# Patient Record
Sex: Female | Born: 1940 | Race: White | Hispanic: No | Smoking: Former smoker
Health system: Southern US, Community
[De-identification: ages and names within clinical notes are randomized; demographics above are authoritative.]

## PROBLEM LIST (undated history)

## (undated) DIAGNOSIS — E785 Hyperlipidemia, unspecified: Secondary | ICD-10-CM

## (undated) DIAGNOSIS — K759 Inflammatory liver disease, unspecified: Secondary | ICD-10-CM

## (undated) DIAGNOSIS — Z8619 Personal history of other infectious and parasitic diseases: Secondary | ICD-10-CM

## (undated) DIAGNOSIS — E039 Hypothyroidism, unspecified: Secondary | ICD-10-CM

## (undated) DIAGNOSIS — Z905 Acquired absence of kidney: Secondary | ICD-10-CM

## (undated) DIAGNOSIS — L509 Urticaria, unspecified: Secondary | ICD-10-CM

## (undated) DIAGNOSIS — J302 Other seasonal allergic rhinitis: Secondary | ICD-10-CM

## (undated) DIAGNOSIS — T783XXA Angioneurotic edema, initial encounter: Secondary | ICD-10-CM

## (undated) HISTORY — PX: ABDOMINAL HYSTERECTOMY: SHX81

## (undated) HISTORY — PX: APPENDECTOMY: SHX54

## (undated) HISTORY — DX: Hyperlipidemia, unspecified: E78.5

## (undated) HISTORY — DX: Personal history of other infectious and parasitic diseases: Z86.19

## (undated) HISTORY — DX: Inflammatory liver disease, unspecified: K75.9

## (undated) HISTORY — DX: Hypothyroidism, unspecified: E03.9

## (undated) HISTORY — DX: Angioneurotic edema, initial encounter: T78.3XXA

## (undated) HISTORY — DX: Other seasonal allergic rhinitis: J30.2

## (undated) HISTORY — PX: NEPHRECTOMY: SHX65

## (undated) HISTORY — PX: COLON RESECTION: SHX5231

## (undated) HISTORY — DX: Urticaria, unspecified: L50.9

## (undated) HISTORY — DX: Acquired absence of kidney: Z90.5

---

## 2001-07-09 ENCOUNTER — Emergency Department (HOSPITAL_COMMUNITY): Admission: EM | Admit: 2001-07-09 | Discharge: 2001-07-09 | Payer: Self-pay | Admitting: Emergency Medicine

## 2002-04-13 ENCOUNTER — Encounter: Payer: Self-pay | Admitting: Internal Medicine

## 2002-04-13 ENCOUNTER — Encounter: Admission: RE | Admit: 2002-04-13 | Discharge: 2002-04-13 | Payer: Self-pay | Admitting: Internal Medicine

## 2002-05-24 ENCOUNTER — Encounter: Payer: Self-pay | Admitting: Emergency Medicine

## 2002-05-24 ENCOUNTER — Emergency Department (HOSPITAL_COMMUNITY): Admission: EM | Admit: 2002-05-24 | Discharge: 2002-05-24 | Payer: Self-pay | Admitting: *Deleted

## 2004-01-04 ENCOUNTER — Emergency Department (HOSPITAL_COMMUNITY): Admission: AD | Admit: 2004-01-04 | Discharge: 2004-01-04 | Payer: Self-pay | Admitting: Internal Medicine

## 2004-10-25 HISTORY — PX: COLONOSCOPY: SHX174

## 2005-05-05 ENCOUNTER — Ambulatory Visit (HOSPITAL_COMMUNITY): Admission: RE | Admit: 2005-05-05 | Discharge: 2005-05-05 | Payer: Self-pay | Admitting: *Deleted

## 2005-05-05 ENCOUNTER — Encounter (INDEPENDENT_AMBULATORY_CARE_PROVIDER_SITE_OTHER): Payer: Self-pay | Admitting: *Deleted

## 2009-11-04 ENCOUNTER — Encounter: Payer: Self-pay | Admitting: Internal Medicine

## 2010-09-22 ENCOUNTER — Encounter (INDEPENDENT_AMBULATORY_CARE_PROVIDER_SITE_OTHER): Payer: Self-pay | Admitting: *Deleted

## 2010-10-07 ENCOUNTER — Ambulatory Visit (HOSPITAL_COMMUNITY): Admission: RE | Admit: 2010-10-07 | Payer: Self-pay | Source: Home / Self Care | Admitting: Orthopedic Surgery

## 2010-10-25 HISTORY — PX: CHOLECYSTECTOMY: SHX55

## 2010-11-10 ENCOUNTER — Ambulatory Visit
Admission: RE | Admit: 2010-11-10 | Discharge: 2010-11-10 | Payer: Self-pay | Source: Home / Self Care | Attending: Internal Medicine | Admitting: Internal Medicine

## 2010-11-15 ENCOUNTER — Encounter: Payer: Self-pay | Admitting: Orthopedic Surgery

## 2010-11-16 ENCOUNTER — Ambulatory Visit: Admit: 2010-11-16 | Payer: Self-pay | Admitting: Internal Medicine

## 2010-11-24 NOTE — Letter (Signed)
Summary: New Patient letter  [facility]  [address]   [place], [place] [address]   Phone: [phone]  Fax: [phone]       09/22/2010 MRN: [id number]  [facility] [address] [place], [place]  [address]  Dear Ms. Azzara,  Welcome to the Gastroenterology Division at [facility].    You are scheduled to see Dr.  Leone Payor on 11/10/2009 at 3:00 on the 3rd floor at [facility], [address]  We ask that you try to arrive at our office 15 minutes prior to your appointment time to allow for check-in.  We would like you to complete the enclosed self-administered evaluation form prior to your visit and bring it with you on the day of your appointment.  We will review it with you.  Also, please bring a complete list of all your medications or, if you prefer, bring the medication bottles and we will list them.  Please bring your insurance card so that we may make a copy of it.  If your insurance requires a referral to see a specialist, please bring your referral form from your primary care physician.  Co-payments are due at the time of your visit and may be paid by cash, check or credit card.     Your office visit will consist of a consult with your physician (includes a physical exam), any laboratory testing he/she may order, scheduling of any necessary diagnostic testing (e.g. x-ray, ultrasound, CT-scan), and scheduling of a procedure (e.g. Endoscopy, Colonoscopy) if required.  Please allow enough time on your schedule to allow for any/all of these possibilities.    If you cannot keep your appointment, please call [phone] to cancel or reschedule prior to your appointment date.  This allows [place] the opportunity to schedule an appointment for another patient in need of care.  If you do not cancel or reschedule by 5 p.m. the business day prior to your appointment date, you will be charged a $50.00 late cancellation/no-show fee.    Thank you for choosing  [facility] for your medical needs.  We appreciate the opportunity to care for you.  Please visit [place] at our website  to learn more about our practice.                     Sincerely,                                                             The Gastroenterology Division

## 2010-11-26 NOTE — Assessment & Plan Note (Addendum)
Summary: PT REQUEST CONSULT FOR COLON.Joan Townsend.   History of Present Illness Visit Type: Initial Visit Primary GI MD: Stan Head MD [facility] Primary Provider: n/a Requesting Provider: n/a Chief Complaint: Patient would like to discuss recent rectal bleeding. History of Present Illness:   70 yo ww with rectal bleeding problems. Last colonoscopy [address]) showed hemorrhoids. She describes prior colon resection for endometriosis. She relates she had increased rectal bleeding after the colonoscopy in 2006. She has had intermittent rectal bleeding over the years especially when thyroid was underactive and stools were dry. She does have a protrusion at the anus at times. Again, never had that til 2006 colonoscopy (prep).  She saw GYN for PAP smear and was told to discuss rectal bleeding with GI. That is Dr. Alain Marion in [place], [place] ([facility]). Only MD she sees.   GI Review of Systems      Denies abdominal pain, acid reflux, belching, bloating, chest pain, dysphagia with liquids, dysphagia with solids, heartburn, loss of appetite, nausea, vomiting, vomiting blood, weight loss, and  weight gain.      Reports constipation, hemorrhoids, and  rectal bleeding.     Denies anal fissure, black tarry stools, change in bowel habit, diarrhea, diverticulosis, fecal incontinence, heme positive stool, irritable bowel syndrome, jaundice, light color stool, liver problems, and  rectal pain. Preventive Screening-Counseling & Management  Alcohol-Tobacco     Smoking Status: quit  Caffeine-Diet-Exercise     Does Patient Exercise: no      Drug Use:  no.      Current Medications (verified): 1)  Estrace 1 Mg Tabs (Estradiol) .... Take 1 Tablet By Mouth Once A Day 2)  Levothroid 125 Mcg Tabs (Levothyroxine Sodium) .... Take 1 Tablet By Mouth Once Every Other Day 3)  Levothyroxine Sodium 88 Mcg Tabs (Levothyroxine Sodium) .... Take 1 Tablet By Mouth Every Other Day 4)  Multivitamins  Tabs (Multiple  Vitamin) .... Take 1 Tablet By Mouth Once A Day 5)  Vitamin D 4000 Iu Capsule .... Take 1 Capsule By Mouth Once A Day 6)  Potassium Iodine and Calcium Tablet .... Take 1 Tablet By Mouth Once A Day  Allergies (verified): 1)  ! Pcn 2)  ! 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 -stopped 1980 Alcohol Use - yes-occasional Illicit Drug Use - no Daily Caffeine Use-2 cups daily Patient does not get regular exercise.  Smoking Status:  quit Drug Use:  no Does Patient Exercise:  no  Review of Systems       The patient complains of fatigue and itching.         All other ROS negative except as per HPI.   Vital Signs:  Patient profile:   70 year old female Height:      67 inches Weight:      175.38 pounds BMI:     27.57 BSA:     1.91 Pulse rate:   104 / minute Pulse rhythm:   regular BP sitting:   142 / 74  (left arm)  Vitals Entered  By: Lamona Curl CMA Duncan Dull) (November 10, 2010 3:20 PM)  Physical Exam  General:  Well developed, well nourished, no acute distress. Eyes:  no icterus. Mouth:  No deformity or lesions, dentition normal. Neck:  Supple; no masses or thyromegaly. Lungs:  Clear throughout to auscultation. Heart:  Regular rate and rhythm; no murmurs, rubs,  or bruits. Abdomen:  scars BS+ soft and nontender without masses Rectal:  female staff present: two anal tags seen slight anal stenosis, brown stool small rectocele no  masses ANOSCOPY: inflamed hemorrhoids  Extremities:  No clubbing, cyanosis, edema or deformities noted. Cervical Nodes:  No significant cervical or supraclavicular adenopathy.  Psych:  Alert and cooperative. Normal mood and affect.   Impression & Recommendations:  Problem # 1:  HEMORRHOIDS, WITH BLEEDING (ICD-455.8) rectal bleeding bright red on toilet paper, chronic and unchanged aggravated by 2006 coonoscopy that showed them and no other problems discussed surgery referral for sclerosis or banding vs possible surgery I think these are grade II and operative intervention not likely she is aware that could be harboring  lesions even more proxmal like  cancer but with the history ad findings today and last colonoscopy 2006 ok, that is unlikely. she accepts not having a colonoscopy at this time. .Hgb > 12 at GYN visit (she will have labs sent)  Patient Instructions: 1)  Call our office when you are wanting to schedule your referral to [facility]. 2)  Hemorrhoids brochure given.  3)  The medication list was reviewed and reconciled.  All changed / newly prescribed medications were explained.  A complete medication list was provided to the patient / caregiver. Prescriptions: ANUSOL-HC 25 MG SUPP (HYDROCORTISONE ACETATE) 1 per rectum nightly x 7 nights then as needed  #21 x 0   Entered and Authorized by:   Iva Boop MD, [facility]   Signed by:   Iva Boop MD, FACG on 11/10/2010   Method used:   Electronically to        [facility]  [phone]* (retail)       [address]       [place], [place]  [id number]       Ph: [phone] or [phone]       Fax: [phone]   RxID:   [id number]   Appended Document: PT REQUEST CONSULT FOR COLON.Joan Townsend Labs from GYN showed thyroid studies only (normal) I recommend she have a CBC here due to rectal bleeding  Appended Document: PT REQUEST CONSULT FOR COLON.Joan Townsend. Left message for patient to call back   new lab  orders entered in [facility] for tomorrow  Appended Document: PT REQUEST CONSULT FOR COLON.Joan Townsend. Left message for patient to call back    Appended Document: PT REQUEST CONSULT FOR COLON.Joan Townsend. Left message for patient to call back    Appended Document: PT REQUEST CONSULT FOR COLON.Joan Townsend. I spoke with the patient and she says she is no longer having rectal bleeding. She states she will call me back about scehduling a CBC when her schedule permits.

## 2010-11-26 NOTE — Op Note (Signed)
Summary: Colonoscopy--Dr. [facility]   NAMEANELA, BENSMAN NO.:  [id number]   MEDICAL RECORD NO.:  [id number]          PATIENT TYPE:  AMB   LOCATION:  ENDO                         [facility]AC[facility]  [facility]   PHYSICIAN:  Georgiana Spinner, M.D.    DATE OF BIRTH:  11-22-1940   DATE OF PROCEDURE:  DATE OF DISCHARGE:                                 OPERATIVE REPORT   PROCEDURE:  Colonoscopy.   ENDOSCOPIST:  Georgiana Spinner, M.D.   INDICATIONS:  Colon cancer screening.   ANESTHESIA:  Demerol 70, Versed 7 mg.   INDICATION:  Colon cancer screening.   DESCRIPTION OF PROCEDURE:  With the patient mildly sedated in the left  lateral decubitus position, the Olympus videoscopic colonoscope was inserted  in the rectum and passed through a tortuous sigmoid colon. With pressure  applied, we were able to reach eventually the cecum, identified by the  ileocecal valve and appendiceal orifice, both which were photographed. From  this point, the colonoscope was slowly withdrawn taking circumferential  views of the colonic mucosa stopping in the rectum which appeared normal on  direct and showed hemorrhoids on retroflexed view. The endoscope was  straightened and withdrawn. The patient's vital signs and pulse oximeter  remained stable. The patient tolerated the procedure well without apparent  complications.   FINDINGS:  Internal hemorrhoids, somewhat tortuous colon due to adhesions of  the sigmoid colon due to previous scar from surgery.   PLAN:  Consider repeat examination in 5-10 years       GMO/MEDQ  D:  05/05/2005  T:  05/05/2005  Job:  [id number]   cc:   Margarita Sermons, M.D.  [address]  [place], [place] [address]

## 2011-01-05 ENCOUNTER — Telehealth: Payer: Self-pay | Admitting: Internal Medicine

## 2011-01-05 DIAGNOSIS — E039 Hypothyroidism, unspecified: Secondary | ICD-10-CM

## 2011-01-05 HISTORY — DX: Hypothyroidism, unspecified: E03.9

## 2011-01-07 ENCOUNTER — Encounter (INDEPENDENT_AMBULATORY_CARE_PROVIDER_SITE_OTHER): Payer: Self-pay | Admitting: *Deleted

## 2011-01-07 ENCOUNTER — Other Ambulatory Visit: Payer: Self-pay | Admitting: Internal Medicine

## 2011-01-07 ENCOUNTER — Other Ambulatory Visit: Payer: Medicare HMO

## 2011-01-07 DIAGNOSIS — K649 Unspecified hemorrhoids: Secondary | ICD-10-CM

## 2011-01-07 DIAGNOSIS — E039 Hypothyroidism, unspecified: Secondary | ICD-10-CM

## 2011-01-07 LAB — CBC WITH DIFFERENTIAL/PLATELET
Basophils Absolute: 0.1 10*3/uL (ref 0.0–0.1)
Basophils Relative: 1.5 % (ref 0.0–3.0)
Eosinophils Absolute: 0.2 10*3/uL (ref 0.0–0.7)
Eosinophils Relative: 2.6 % (ref 0.0–5.0)
HCT: 40.7 % (ref 36.0–46.0)
Hemoglobin: 13.9 g/dL (ref 12.0–15.0)
Lymphocytes Relative: 30.9 % (ref 12.0–46.0)
Lymphs Abs: 2.1 10*3/uL (ref 0.7–4.0)
MCHC: 34.2 g/dL (ref 30.0–36.0)
MCV: 88 fl (ref 78.0–100.0)
Monocytes Absolute: 0.6 10*3/uL (ref 0.1–1.0)
Monocytes Relative: 8.1 % (ref 3.0–12.0)
Neutro Abs: 3.9 10*3/uL (ref 1.4–7.7)
Neutrophils Relative %: 56.9 % (ref 43.0–77.0)
Platelets: 265 10*3/uL (ref 150.0–400.0)
RBC: 4.62 Mil/uL (ref 3.87–5.11)
RDW: 14.3 % (ref 11.5–14.6)
WBC: 6.9 10*3/uL (ref 4.5–10.5)

## 2011-01-07 LAB — TSH: TSH: 0.12 u[IU]/mL — ABNORMAL LOW (ref 0.35–5.50)

## 2011-01-12 NOTE — Progress Notes (Signed)
Summary: Labs  Phone Note Call from Patient Call back at Home Phone [phone]   Caller: Patient Call For: Dr. Leone Payor Reason for Call: Talk to Nurse Summary of Call: Patient is calling now to schedule her labs Initial call taken by: [place] Johnson,  January 05, 2011 9:58 AM  Follow-up for Phone Call        Patient calling because she did not go to [place] and get her lab work done as she planned. She states she would like to have her labs done here. Looks like in 10/2010 she was due for a CBC. Is this all she would need at this point? Please, advise. Follow-up by: Jesse Fall RN,  January 05, 2011 11:31 AM  Additional Follow-up for Phone Call Additional follow up Details #1::        Yes CBC is all re hemorrhoids with bleeding Iva Boop MD, [facility]  January 05, 2011 1:07 PM   New Problems: HYPOTHYROIDISM (ICD-244.9)   Additional Follow-up for Phone Call Additional follow up Details #2::    Patient wants to know if Dr. Leone Payor will order a TSH also. Please, advise. Follow-up by: Jesse Fall RN,  January 05, 2011 1:20 PM  Additional Follow-up for Phone Call Additional follow up Details #3:: Details for Additional Follow-up Action Taken: ok use hypothyroidism dx Iva Boop MD, [facility]  January 05, 2011 2:54 PM Lab added in [facility] as per Dr. Leone Payor. Patient aware. Jesse Fall, RN 01/05/11 3:00 PM Additional Follow-up by: Jesse Fall RN,  January 05, 2011 3:02 PM  New Problems: HYPOTHYROIDISM (ICD-244.9)

## 2011-03-09 ENCOUNTER — Telehealth: Payer: Self-pay | Admitting: Internal Medicine

## 2011-03-09 NOTE — Telephone Encounter (Addendum)
Patient called to report she is having stabbing abdominal pain that started on Sunday night.  She states the pain has not been as bad today.  She reports bloating and a feeling of fullness with lots of gas.  She is able to tolerate a soft diet.  She is scheduled to see Dr Leone Payor on 03/11/11 10:45.

## 2011-03-09 NOTE — Telephone Encounter (Signed)
Left message for patient to call back  

## 2011-03-11 ENCOUNTER — Ambulatory Visit (HOSPITAL_COMMUNITY)
Admission: RE | Admit: 2011-03-11 | Discharge: 2011-03-11 | Disposition: A | Discharge status: Home / Self Care | Payer: Medicare HMO | Source: Ambulatory Visit | Attending: Internal Medicine | Admitting: Internal Medicine

## 2011-03-11 ENCOUNTER — Ambulatory Visit (INDEPENDENT_AMBULATORY_CARE_PROVIDER_SITE_OTHER): Payer: Medicare HMO | Admitting: Internal Medicine

## 2011-03-11 ENCOUNTER — Other Ambulatory Visit: Payer: Medicare HMO

## 2011-03-11 ENCOUNTER — Other Ambulatory Visit (INDEPENDENT_AMBULATORY_CARE_PROVIDER_SITE_OTHER): Payer: Medicare HMO

## 2011-03-11 ENCOUNTER — Encounter: Payer: Self-pay | Admitting: Internal Medicine

## 2011-03-11 DIAGNOSIS — R1011 Right upper quadrant pain: Secondary | ICD-10-CM

## 2011-03-11 DIAGNOSIS — R1013 Epigastric pain: Secondary | ICD-10-CM

## 2011-03-11 DIAGNOSIS — K8020 Calculus of gallbladder without cholecystitis without obstruction: Secondary | ICD-10-CM | POA: Insufficient documentation

## 2011-03-11 DIAGNOSIS — K649 Unspecified hemorrhoids: Secondary | ICD-10-CM

## 2011-03-11 DIAGNOSIS — Z905 Acquired absence of kidney: Secondary | ICD-10-CM | POA: Insufficient documentation

## 2011-03-11 LAB — COMPREHENSIVE METABOLIC PANEL
ALT: 13 U/L (ref 0–35)
AST: 22 U/L (ref 0–37)
Albumin: 3.3 g/dL — ABNORMAL LOW (ref 3.5–5.2)
CO2: 29 mEq/L (ref 19–32)
Calcium: 9.5 mg/dL (ref 8.4–10.5)
Chloride: 104 mEq/L (ref 96–112)
GFR: 51.31 mL/min — ABNORMAL LOW (ref >60.00)
Potassium: 5.1 mEq/L (ref 3.5–5.1)
Total Protein: 7.1 g/dL (ref 6.0–8.3)

## 2011-03-11 LAB — URINALYSIS
Leukocytes, UA: NEGATIVE
Nitrite: NEGATIVE
Specific Gravity, Urine: 1.02
Urine Glucose: NEGATIVE
Urobilinogen, UA: 1
pH: 6 (ref 5.0–8.0)

## 2011-03-11 LAB — CBC WITH DIFFERENTIAL/PLATELET
Basophils Absolute: 0.1 10*3/uL (ref 0.0–0.1)
Eosinophils Absolute: 0.3 10*3/uL (ref 0.0–0.7)
Hemoglobin: 14 g/dL (ref 12.0–15.0)
Lymphocytes Relative: 19.5 % (ref 12.0–46.0)
MCHC: 34.3 g/dL (ref 30.0–36.0)
Monocytes Relative: 8.9 % (ref 3.0–12.0)
Neutrophils Relative %: 67.9 % (ref 43.0–77.0)
RBC: 4.72 Mil/uL (ref 3.87–5.11)
RDW: 14.2 % (ref 11.5–14.6)

## 2011-03-11 LAB — AMYLASE: Amylase: 79 U/L (ref 27–131)

## 2011-03-11 NOTE — Progress Notes (Signed)
  Subjective:    Patient ID: Joan Townsend, female    DOB: May 10, 1941, 70 y.o.   MRN: [id number]  HPI 70 yo ww with right-sided abdomnial pain that began with gas and bloating 5 days ago after eating out for Mother's Day. Had to walk in early Am at night, finally slept some. Took some Tums and it helped with gas. She took one aleve and has used every 6 hrs. "The pain was awful". No urinary symptoms. "a little bit of fever - T 100". Defecation was ok but no bowel movements until last night. She did not eat anything untl liquid soft meal last night. Definitely better but still a problem. Still feels sore and swollen. No nausea, diarrhea.   Review of Systems Hurts straight though to back Painful when she moves some - difficult to move to sit or get up.    Objective:   Physical Exam  Constitutional: She is oriented to person, place, and time. She appears well-developed and well-nourished. She appears distressed.  Eyes: No scleral icterus.  Cardiovascular: Normal rate, regular rhythm and normal heart sounds.   Pulmonary/Chest: Effort normal and breath sounds normal.  Abdominal: Soft. Bowel sounds are normal. She exhibits no distension and no mass. There is tenderness. There is guarding.       Very tender in RUQ, RMQ and epigastrium   Musculoskeletal:       Right CVA tenderness  Neurological: She is alert and oriented to person, place, and time.          Assessment & Plan:

## 2011-03-11 NOTE — Assessment & Plan Note (Signed)
This is part of the right upper quadrant problem as well. She is tender there making me think of pancreatitis or retained common duct stone or cholecystitis as in the other assessment and plan

## 2011-03-11 NOTE — Assessment & Plan Note (Signed)
This is fairly severe it has been present for several days. The overall clinical scenario makes me think she has a gallbladder problem. Cholecystitis, impacted her retained common bile duct stone or pancreatitis or some combination of all the above are possible. I plan her she may very well need to be admitted to the hospital. She does not want to start that way. We have agreed for her to have an abdominal ultrasound and lab testing to include CBC, comprehensive metabolic panel, amylase, lipase and a urinalysis. Once I have those results and further disposition will be made. We will do that today.

## 2011-03-11 NOTE — Assessment & Plan Note (Signed)
These have  stopped bleeding after hydrocortisone topical therapy.

## 2011-03-11 NOTE — Patient Instructions (Signed)
Go to the basement today for labs. You have been scheduled for an Abdominal Ultrasound today at [facility] Be there at 1:15 pm. Have nothing else to eat today before the Ultrasound.

## 2011-03-12 ENCOUNTER — Telehealth: Payer: Self-pay

## 2011-03-12 ENCOUNTER — Other Ambulatory Visit (INDEPENDENT_AMBULATORY_CARE_PROVIDER_SITE_OTHER): Payer: Self-pay | Admitting: General Surgery

## 2011-03-12 ENCOUNTER — Inpatient Hospital Stay (HOSPITAL_COMMUNITY): Admission: AD | Admit: 2011-03-12 | Payer: Self-pay | Source: Ambulatory Visit

## 2011-03-12 ENCOUNTER — Ambulatory Visit (HOSPITAL_COMMUNITY)
Admission: AD | Admit: 2011-03-12 | Discharge: 2011-03-12 | Disposition: A | Discharge status: Home / Self Care | Payer: Medicare HMO | Source: Ambulatory Visit | Attending: General Surgery | Admitting: General Surgery

## 2011-03-12 DIAGNOSIS — E039 Hypothyroidism, unspecified: Secondary | ICD-10-CM | POA: Insufficient documentation

## 2011-03-12 DIAGNOSIS — K8000 Calculus of gallbladder with acute cholecystitis without obstruction: Secondary | ICD-10-CM | POA: Insufficient documentation

## 2011-03-12 DIAGNOSIS — Z01812 Encounter for preprocedural laboratory examination: Secondary | ICD-10-CM | POA: Insufficient documentation

## 2011-03-12 DIAGNOSIS — R1013 Epigastric pain: Secondary | ICD-10-CM | POA: Insufficient documentation

## 2011-03-12 DIAGNOSIS — K8010 Calculus of gallbladder with chronic cholecystitis without obstruction: Secondary | ICD-10-CM | POA: Insufficient documentation

## 2011-03-12 DIAGNOSIS — Z01810 Encounter for preprocedural cardiovascular examination: Secondary | ICD-10-CM | POA: Insufficient documentation

## 2011-03-12 LAB — SURGICAL PCR SCREEN: MRSA, PCR: NEGATIVE

## 2011-03-12 NOTE — Op Note (Signed)
NAMELONITA, DEBES                  ACCOUNT NO.:  [id number]   MEDICAL RECORD NO.:  [id number]          PATIENT TYPE:  AMB   LOCATION:  ENDO                         FACILITY[facility]  [facility]   PHYSICIAN:  Georgiana Spinner, M.D.    DATE OF BIRTH:  [birth year]   DATE OF PROCEDURE:  DATE OF DISCHARGE:                                 OPERATIVE REPORT   PROCEDURE:  Colonoscopy.   ENDOSCOPIST:  Georgiana Spinner, M.D.   INDICATIONS:  Colon cancer screening.   ANESTHESIA:  Demerol 70, Versed 7 mg.   INDICATION:  Colon cancer screening.   DESCRIPTION OF PROCEDURE:  With the patient mildly sedated in the left  lateral decubitus position, the Olympus videoscopic colonoscope was inserted  in the rectum and passed through a tortuous sigmoid colon. With pressure  applied, we were able to reach eventually the cecum, identified by the  ileocecal valve and appendiceal orifice, both which were photographed. From  this point, the colonoscope was slowly withdrawn taking circumferential  views of the colonic mucosa stopping in the rectum which appeared normal on  direct and showed hemorrhoids on retroflexed view. The endoscope was  straightened and withdrawn. The patient's vital signs and pulse oximeter  remained stable. The patient tolerated the procedure well without apparent  complications.   FINDINGS:  Internal hemorrhoids, somewhat tortuous colon due to adhesions of  the sigmoid colon due to previous scar from surgery.   PLAN:  Consider repeat examination in 5-10 years       GMO/MEDQ  D:  05/05/2005  T:  05/05/2005  Job:  [id number]   cc:   Margarita Sermons, M.D.  [address]  [place], [place] [address]

## 2011-03-12 NOTE — Telephone Encounter (Signed)
Message copied by Darcey Nora on Fri Mar 12, 2011  8:48 AM ------      Message from: Stan Head      Created: Fri Mar 12, 2011  7:52 AM       I called her last night with results. She was slightly better - tolerated some food.      Looks like gallstones/cholecystitis      She was not febrile. Was to go to ED if worse overnight.      Also told her to stay NPO today.            Need to arrange surgical appointment for today. I will call you to discuss

## 2011-03-12 NOTE — Telephone Encounter (Signed)
Patient is advised that we are waiting to hear back from [facility] about appt for today.  Patient is advised that she should remain NPO and we will call her with additional instructions.

## 2011-03-12 NOTE — Telephone Encounter (Signed)
Patient will be admitted to [facility] today.  I have arranged for a bed, patient is aware.  Notes faxed to [employer] [facility]

## 2011-03-12 NOTE — Progress Notes (Signed)
Quick Note:  I called her last night with results. She was slightly better - tolerated some food. Looks like gallstones/cholecystitis She was not febrile. Was to go to ED if worse overnight. Also told her to stay NPO today.  Need to arrange surgical appointment for today. I will call you to discuss ______

## 2011-03-22 ENCOUNTER — Other Ambulatory Visit: Payer: Self-pay | Admitting: Internal Medicine

## 2011-04-12 NOTE — Op Note (Signed)
NAMEHANG, AMMON                  ACCOUNT NO.:  [id number]  MEDICAL RECORD NO.:  [id number]           PATIENT TYPE:  O  LOCATION:  DAYL                         FACILITY[facility]  [facility]  PHYSICIAN:  Lennie Muckle, MD      DATE OF BIRTH:  Apr 08, 1941  DATE OF PROCEDURE:  03/12/2011 DATE OF DISCHARGE:  03/12/2011                              OPERATIVE REPORT   PREOPERATIVE DIAGNOSES:  Acute cholecystitis, cholelithiasis.  POSTOPERATIVE DIAGNOSIS:  Acute cholecystitis, cholelithiasis.  PROCEDURE:  Laparoscopic cholecystectomy.  SURGEON:  Lennie Muckle, MD  ASSISTANT:  Brayton El, PA-C.  ANESTHESIA:  General endotracheal anesthesia.  ESTIMATED BLOOD LOSS:  Minimal.  COMPLICATIONS:  No immediate complications.  DRAINS:  No drains were placed.  SPECIMEN:  Gallbladder.  INDICATIONS FOR PROCEDURE:  Ms. Funderburke is a 70 year old female who had epigastric discomfort started around Mother's Day.  The pain was located in the epigastric region.  She was seen by Dr. Leone Payor and received a workup.  Ultrasound revealed stones in the gallbladder and gallbladder wall thickening.  It is believed she had acute cholecystitis.  She was admitted to the hospital and seen by Surgery, and deemed that she indeed have acute cholecystitis.  Informed consent was obtained for laparoscopic cholecystectomy.  She received preoperative antibiotics.  DETAILS OF PROCEDURE:  Ms. Gunnoe was seen in the preoperative anesthesia area, taken to the operating room, placed in supine position.  After administration of general endotracheal anesthesia, sequential compression devices were applied to her lower extremity.  Her abdomen was then prepped and draped in the usual sterile fashion after administration of general endotracheal anesthesia.  After surgical time- out was performed, I placed a 5-mm trocar in the right upper quadrant using Optiview.  All layers of abdominal wall were visualized upon entry.  After  insufflating the abdomen, I inspected the abdomen and found no evidence of injury upon placement of the trocar.  An 11-mm trocar was placed beneath the umbilical region.  The camera was moved to the umbilical region.  I then placed 2 additional trocars in the epigastric region under visualization with camera.  The gallbladder was distended and the omentum was around the vicinity.  In lifting the gallbladder up, I encountered the purulent fluid coming from the gallbladder.  I was able to grasp the gallbladder, retracted this to the head of the patient.  Retracted the infundibulum away from liver bed. The gallbladder wall was very thickened in nature.  Using blunt dissection and electrocautery, I was able to identify the cystic duct and artery.  She had a large cystic node, which I was able to bluntly dissect.  After I obtained the critical view of the cystic duct and artery, I palpated the cystic duct and found no stones.  I then placed 3 clips proximally and one distally and transected the cystic duct.  The cystic artery was also clipped and divided.  I continued dissecting the gallbladder using blunt dissection and electrocautery.  The gallbladder was placed in an EndoCatch bag and removed from the umbilical incision. I then irrigated the liver bed.  There was minimal  amount of oozing from the liver bed.  I used electrocautery to correct this.  I then used a total of 3 L of saline to irrigate the abdomen and the gallbladder fossa.  I irrigated until the fluid was clear.  I inspected down the pelvis and found no evidence of clear fluid.  I then closed the fascial defect at the umbilical region using a 0 Vicryl suture.  I did one final inspection of the abdomen and the liver bed was dry, no bile drainage, no bleeding, no evidence of injury.  I then released pneumoperitoneum, injected 30 mL of 0.25% Marcaine at all sites for local anesthetic. Skin was closed with 4-0 Monocryl and Dermabond  was placed as final dressing.  The patient was extubated, transferred to postanesthesia care unit in stable condition.  She will hopefully be able to be discharged home with Cipro for approximately 7 days.  She will follow up with me in 2 to 3 weeks' time.     Lennie Muckle, MD     ALA/MEDQ  D:  03/12/2011  T:  03/12/2011  Job:  [id number]  cc:   Iva Boop, MD,FACG [facility] [address] [place], [place] [address]  Electronically Signed by Bertram Savin MD on 04/12/2011 12:11:52 PM

## 2011-04-15 ENCOUNTER — Encounter (INDEPENDENT_AMBULATORY_CARE_PROVIDER_SITE_OTHER): Payer: Self-pay | Admitting: General Surgery

## 2011-05-04 NOTE — H&P (Signed)
NAMEBRITTON, PERKINSON                  ACCOUNT NO.:  [id number]  MEDICAL RECORD NO.:  [id number]           PATIENT TYPE:  O  LOCATION:  DAYL                         FACILITY:  [facility]  PHYSICIAN:  Lennie Muckle, MD      DATE OF BIRTH:  1941/07/05  DATE OF ADMISSION:  03/12/2011 DATE OF DISCHARGE:                             HISTORY & PHYSICAL   CHIEF COMPLAINT:  Abdominal pain, cholecystitis.  HISTORY OF PRESENT ILLNESS:  Ms. Manganiello is a 70 year old female who about 5 or 6 days ago developed some right upper quadrant pain of rather sudden onset, this was fairly severe.  She has never had pain like this before.  She has really had no appetite and has been feeling rather blah for the past several days as well.  She started developing some low- grade fever yesterday, never really breaking 100, but feeling feverish and having some chills.  She got herself to Dr. Leone Payor who worked her up, had some outpatient labs drawn that were negative and thought she may be having some pancreatitis or cholecystitis and thought she would benefit from admission for workup; however, she refused but then agreed to have a ultrasound performed.  This ultrasound was performed yesterday afternoon and did show evidence of gallstones with mild wall thickening consistent with cholecystitis.  Therefore, he has contacted [place] today to see if she would be able to come in today for surgical intervention.  Remaining review of systems is otherwise negative.  We have agreed to see this patient here in the hospital today for surgical intervention.  PAST MEDICAL HISTORY:  Hypothyroidism, history of endometriosis.  PAST SURGICAL HISTORY:  The patient had an appendectomy.  She had a right nephrectomy greater than 30 years ago by a retroperitoneal approach.  She has had a hysterectomy for endometriosis and states that they removed part of her colon though she cannot say where during that procedure.  FAMILY HISTORY:   Noncontributory to present case.  SOCIAL HISTORY:  The patient quit smoking in 1980s.  She has a rare alcoholic beverage.  Denies any illicit drug use.  Otherwise still working but lives at home.  MEDICATIONS:  Include Estrace, Levothroid, multivitamin and vitamin D supplements.  ALLERGIES:  Include PENICILLIN and CIPRO.  REVIEW OF SYSTEMS:  Please see history of present illness for pertinent findings.  PHYSICAL EXAMINATION:  GENERAL:  Physical exam reveals a 70 year old white female, in no acute distress. VITAL SIGNS:  Showed temperature of 97.9, heart rate of 95, blood pressure 134/76, respiratory rate of 18, oxygen saturation 98%. HEENT:  ENT is unremarkable. NECK:  Supple without lymphadenopathy.  Trachea is midline.  No thyromegaly or masses. LUNGS:  Clear to auscultation.  No wheezing, rhonchi or rales. HEART:  Regular rate and rhythm.  No murmurs, gallops or rubs.  Carotids 2+ brisk without bruit.  Peripheral pulses intact and symmetrical. ABDOMEN:  Soft, flat, multiple surgical scars are noted.  She is mildly tender in the right upper quadrant without rebound or peritonitis. SKIN:  Otherwise warm and dry with good turgor.  No rashes, lesions, nodules or jaundice. NEUROLOGIC:  The patient is alert and oriented x3.  LABORATORY DATA:  Labs include CBC showing white blood cell count normal at 10.2, hemoglobin of 14, hematocrit of 40.9, platelet count of 284. Metabolic panel shows sodium of 140, potassium 5.1, chloride of 104, CO2 of 29, BUN of 14, creatinine of 1.1, glucose of 88.  Liver enzymes within normal limits.  Urinalysis negative.  Ultrasound performed yesterday again finds stones and gallbladder wall thickening or pericholecystic fluid, common bile duct within normal limits.  IMPRESSION: 1. Acute cholecystitis. 2. Hypothyroidism.  PLAN:  We will admit the patient for surgical intervention.  I discussed the need for cholecystectomy including the procedure of  laparoscopic versus open cholecystectomy, plan to start her on IV Invanz prior to procedure.  She is agreeable to our plan and will consent.     Brayton El, PA-C   ______________________________ Lennie Muckle, MD    KB/MEDQ  D:  03/12/2011  T:  03/12/2011  Job:  [id number]  Electronically Signed by Brayton El  on 04/19/2011 02:31:57 PM Electronically Signed by Bertram Savin MD on 05/04/2011 11:42:11 AM

## 2012-06-28 ENCOUNTER — Encounter: Payer: Self-pay | Admitting: Internal Medicine

## 2012-06-28 DIAGNOSIS — Z0000 Encounter for general adult medical examination without abnormal findings: Secondary | ICD-10-CM | POA: Insufficient documentation

## 2012-06-29 ENCOUNTER — Encounter: Payer: Self-pay | Admitting: Internal Medicine

## 2012-06-29 ENCOUNTER — Ambulatory Visit (INDEPENDENT_AMBULATORY_CARE_PROVIDER_SITE_OTHER)
Admission: RE | Admit: 2012-06-29 | Discharge: 2012-06-29 | Disposition: A | Discharge status: Home / Self Care | Payer: Medicare Other | Source: Ambulatory Visit | Attending: Internal Medicine | Admitting: Internal Medicine

## 2012-06-29 ENCOUNTER — Ambulatory Visit (INDEPENDENT_AMBULATORY_CARE_PROVIDER_SITE_OTHER): Payer: Medicare Other | Admitting: Internal Medicine

## 2012-06-29 ENCOUNTER — Other Ambulatory Visit (INDEPENDENT_AMBULATORY_CARE_PROVIDER_SITE_OTHER): Payer: Medicare Other

## 2012-06-29 VITALS — BP 140/80 | HR 69 | Temp 97.6°F | Resp 16 | Ht 65.75 in | Wt 176.1 lb

## 2012-06-29 DIAGNOSIS — E785 Hyperlipidemia, unspecified: Secondary | ICD-10-CM

## 2012-06-29 DIAGNOSIS — R079 Chest pain, unspecified: Secondary | ICD-10-CM

## 2012-06-29 DIAGNOSIS — Z79899 Other long term (current) drug therapy: Secondary | ICD-10-CM

## 2012-06-29 DIAGNOSIS — Z905 Acquired absence of kidney: Secondary | ICD-10-CM | POA: Insufficient documentation

## 2012-06-29 DIAGNOSIS — Z0000 Encounter for general adult medical examination without abnormal findings: Secondary | ICD-10-CM

## 2012-06-29 HISTORY — DX: Hyperlipidemia, unspecified: E78.5

## 2012-06-29 LAB — URINALYSIS, ROUTINE W REFLEX MICROSCOPIC
Leukocytes, UA: NEGATIVE
Nitrite: NEGATIVE
Specific Gravity, Urine: 1.02 (ref 1.000–1.030)
pH: 6 (ref 5.0–8.0)

## 2012-06-29 LAB — BASIC METABOLIC PANEL
BUN: 16 mg/dL (ref 6–23)
Creatinine, Ser: 1 mg/dL (ref 0.4–1.2)
GFR: 57.01 mL/min — ABNORMAL LOW (ref >60.00)
Glucose, Bld: 86 mg/dL (ref 70–99)
Potassium: 4.4 mEq/L (ref 3.5–5.1)

## 2012-06-29 LAB — HEPATIC FUNCTION PANEL
AST: 15 U/L (ref 0–37)
Albumin: 3.5 g/dL (ref 3.5–5.2)

## 2012-06-29 LAB — LIPID PANEL
Cholesterol: 208 mg/dL — ABNORMAL HIGH (ref 0–200)
HDL: 73.8 mg/dL (ref >39.00)
Triglycerides: 82 mg/dL (ref 0.0–149.0)

## 2012-06-29 LAB — CBC WITH DIFFERENTIAL/PLATELET
Eosinophils Relative: 2 % (ref 0.0–5.0)
HCT: 42.9 % (ref 36.0–46.0)
Lymphs Abs: 2 10*3/uL (ref 0.7–4.0)
Monocytes Relative: 8.7 % (ref 3.0–12.0)
Neutrophils Relative %: 57.2 % (ref 43.0–77.0)
Platelets: 248 10*3/uL (ref 150.0–400.0)
WBC: 6.7 10*3/uL (ref 4.5–10.5)

## 2012-06-29 LAB — TSH: TSH: 0.56 u[IU]/mL (ref 0.35–5.50)

## 2012-06-29 MED ORDER — SYNTHROID 88 MCG PO TABS: 88.0000 ug | ORAL_TABLET | Freq: Every day | ORAL | Status: DC

## 2012-06-29 NOTE — Progress Notes (Signed)
Subjective:    Patient ID: Joan Townsend, female    DOB: [birth year], 71 y.o.   MRN: [id number]  HPI  Here for wellness and f/u;  Overall doing ok;  Pt denies worsening SOB, DOE, wheezing, orthopnea, PND, worsening LE edema, palpitations, dizziness or syncope though has had some intermittent upper "tightness "CP with radiation to the right upper back,  assoc with mild increased BP and HR, but no diaphoresis, n/v, palps, dizziness, sob. Had discomfort similar to GB pain last yr, but now seems to have recurred.    Denies worsening reflux, dysphagia, abd pain, n/v, bowel change or blood.  Has had incresd stress since her mother died, and a sister who aggrevates her.  Thyroid medication just adjusted 4 wks ago.  Does have several mos ongoing nasal allergy symptoms with clear congestion, post nasal gtt, and ocas non prod cough, but no  itch and sneeze, without fever, pain, ST, cough or wheezing,   Had an EKG last yr, no prior stress testing.    Pt denies neurological change such as new Headache, facial or extremity weakness.  Pt denies polydipsia, polyuria, or low sugar symptoms. Pt states overall good compliance with treatment and medications, good tolerability, and trying to follow lower cholesterol diet.  Pt denies worsening depressive symptoms, suicidal ideation or panic. No fever, wt loss, night sweats, loss of appetite, or other constitutional symptoms.  Pt states good ability with ADL's, low fall risk, home safety reviewed and adequate, no significant changes in hearing or vision, and occasionally active with exercise. Past Medical History  Diagnosis Date  . Hemorrhoids   . Endometriosis   . History of scarlet fever   . Hypothyroidism   . Hepatitis     result of Scarlet fever  . HYPOTHYROIDISM 01/05/2011    Qualifier: Diagnosis of  By: Leone Payor MD, Charlyne Quale Acquired solitary kidney     s/p right nephrectomy   Past Surgical History  Procedure Date  . Appendectomy   . Abdominal hysterectomy    . Colon resection     part of endometriosis surgery  . Nephrectomy     right, due to birth deformity  . Colonoscopy 2006    hemorhoids (Dr. Virginia Rochester)    reports that she quit smoking about 33 years ago. Her smoking use included Cigarettes. She has never used smokeless tobacco. She reports that she drinks alcohol. She reports that she does not use illicit drugs. family history includes Breast cancer in an unspecified family member; Colon cancer in an unspecified family member; Heart disease in her maternal grandfather; Ovarian cancer in an unspecified family member; and Uterine cancer in her mother. Allergies  Allergen Reactions  . Ciprofloxacin     REACTION: rash  . Penicillins     REACTION: rash   Current Outpatient Prescriptions on File Prior to Visit  Medication Sig Dispense Refill  . Multiple Vitamin (MULTIVITAMIN PO) Take 1 tablet by mouth daily.        Marland Kitchen DISCONTD: levothyroxine (SYNTHROID, LEVOTHROID) 88 MCG tablet Take 88 mcg by mouth every other day.        . AMBULATORY NON FORMULARY MEDICATION Potassium Iodine and Calcium Tablet Take 1 tablet by mouth once daily        . estradiol (ESTRACE) 1 MG tablet Take 1 mg by mouth daily.        . hydrocortisone (ANUSOL-HC) 25 MG suppository INSERT 1 PER RECTUM NIGHTLY X 7 NIGHTS THEN AS NEEDED  21 suppository  0  . DISCONTD: levothyroxine (SYNTHROID, LEVOTHROID) 125 MCG tablet Take 125 mcg by mouth every other day.         Review of Systems Review of Systems  Constitutional: Negative for diaphoresis, activity change, appetite change and unexpected weight change.  HENT: Negative for hearing loss, ear pain, facial swelling, mouth sores and neck stiffness.   Eyes: Negative for pain, redness and visual disturbance.  Respiratory: Negative for shortness of breath and wheezing.   Cardiovascular: Negative for chest pain and palpitations.  Gastrointestinal: Negative for diarrhea, blood in stool, abdominal distention and rectal pain.    Genitourinary: Negative for hematuria, flank pain and decreased urine volume.  Musculoskeletal: Negative for myalgias and joint swelling.  Skin: Negative for color change and wound.  Neurological: Negative for syncope and numbness.  Hematological: Negative for adenopathy.  Psychiatric/Behavioral: Negative for hallucinations, self-injury, decreased concentration and agitation.      Objective:   Physical Exam BP 140/80  Pulse 69  Temp 97.6 F (36.4 C) (Oral)  Resp 16  Ht 5' 5.75" (1.67 m)  Wt 176 lb 2 oz (79.89 kg)  BMI 28.64 kg/m2  SpO2 94% Physical Exam  VS noted Constitutional: Pt is oriented to person, place, and time. Appears well-developed and well-nourished.  Head: Normocephalic and atraumatic.  Right Ear: External ear normal.  Left Ear: External ear normal.  Nose: Nose normal.  Mouth/Throat: Oropharynx is clear and moist.  Eyes: Conjunctivae and EOM are normal. Pupils are equal, round, and reactive to light.  Neck: Normal range of motion. Neck supple. No JVD present. No tracheal deviation present.  Cardiovascular: Normal rate, regular rhythm, normal heart sounds and intact distal pulses.   Pulmonary/Chest: Effort normal and breath sounds normal.  Abdominal: Soft. Bowel sounds are normal. There is no tenderness.  Musculoskeletal: Normal range of motion. Exhibits no edema.  Lymphadenopathy:  Has no cervical adenopathy.  Neurological: Pt is alert and oriented to person, place, and time. Pt has normal reflexes. No cranial nerve deficit.  Skin: Skin is warm and dry. No rash noted.  Psychiatric:  Has  normal mood and affect. Behavior is normal.     Assessment & Plan:

## 2012-06-29 NOTE — Assessment & Plan Note (Signed)
Atypical, ECG reviewed as per emr, for cxr, consider stress testing

## 2012-06-29 NOTE — Patient Instructions (Addendum)
Continue all other medications as before Your EKG was ok Please go to XRAY in the Basement for the x-ray test Please go to LAB in the Basement for the blood and/or urine tests to be done today You will be contacted by phone if any changes need to be made immediately.  Otherwise, you will receive a letter about your results with an explanation. Please continue your efforts at being more active, low cholesterol diet, and weight control. Please return in 1 year for your yearly visit, or sooner if needed, with Lab testing done 3-5 days before

## 2012-06-29 NOTE — Assessment & Plan Note (Addendum)
Overall doing well, age appropriate education and counseling updated, referrals for preventative services and immunizations addressed, dietary and smoking counseling addressed, most recent labs and ECG reviewed.  I have personally reviewed and have noted: 1) the patient's medical and social history 2) The pt's use of alcohol, tobacco, and illicit drugs 3) The patient's current medications and supplements 4) Functional ability including ADL's, fall risk, home safety risk, hearing and visual impairment 5) Diet and physical activities 6) Evidence for depression or mood disorder 7) The patient's height, weight, and BMI have been recorded in the chart I have made referrals, and provided counseling and education based on review of the above Declines immunizations, ECG reviewed as per emr

## 2012-10-04 ENCOUNTER — Other Ambulatory Visit: Payer: Self-pay

## 2012-10-06 ENCOUNTER — Encounter: Payer: Self-pay | Admitting: Internal Medicine

## 2012-10-06 ENCOUNTER — Ambulatory Visit (INDEPENDENT_AMBULATORY_CARE_PROVIDER_SITE_OTHER): Payer: Medicare Other | Admitting: Internal Medicine

## 2012-10-06 VITALS — BP 110/70 | HR 73 | Temp 97.8°F

## 2012-10-06 DIAGNOSIS — R040 Epistaxis: Secondary | ICD-10-CM

## 2012-10-06 MED ORDER — MUPIROCIN 2 % EX OINT: TOPICAL_OINTMENT | Freq: Three times a day (TID) | CUTANEOUS | Status: DC

## 2012-10-06 NOTE — Patient Instructions (Addendum)

## 2012-10-06 NOTE — Progress Notes (Signed)
Subjective:    Patient ID: Joan Townsend, female    DOB: 09-25-1941, 71 y.o.   MRN: [id number]  HPI  Pt presents to the clinic today wit hc/o nose bleed. This started on Wednesday night. She was able to stop the bleeding. It occurred again Thursday morning. Her blood pressure has not been elevated. She denies visual disturbances. She does feel like her nose is dry due to the dry air.  Review of Systems  Past Medical History  Diagnosis Date  . Hemorrhoids   . Endometriosis   . History of scarlet fever   . Hypothyroidism   . Hepatitis     result of Scarlet fever  . HYPOTHYROIDISM 01/05/2011    Qualifier: Diagnosis of  By: Leone Payor MD, Charlyne Quale Acquired solitary kidney     s/p right nephrectomy  . Hyperlipidemia 06/29/2012    Current Outpatient Prescriptions  Medication Sig Dispense Refill  . AMBULATORY NON FORMULARY MEDICATION Potassium Iodine and Calcium Tablet Take 1 tablet by mouth once daily        . estradiol (ESTRACE) 1 MG tablet Take 1 mg by mouth daily.        . hydrocortisone (ANUSOL-HC) 25 MG suppository INSERT 1 PER RECTUM NIGHTLY X 7 NIGHTS THEN AS NEEDED  21 suppository  0  . Multiple Vitamin (MULTIVITAMIN PO) Take 1 tablet by mouth daily.        Marland Kitchen SYNTHROID 88 MCG tablet Take 1 tablet (88 mcg total) by mouth daily.  90 tablet  3    Allergies  Allergen Reactions  . Ciprofloxacin     REACTION: rash  . Penicillins     REACTION: rash    Family History  Problem Relation Age of Onset  . Breast cancer      great aunts x2, cousin  . Ovarian cancer      aunt  . Uterine cancer Mother   . Colon cancer      great aunts x3  . Heart disease Maternal Grandfather     History   Social History  . Marital Status: Widowed    Spouse Name: N/A    Number of Children: N/A  . Years of Education: N/A   Occupational History  . retired    Social History Main Topics  . Smoking status: Former Smoker    Types: Cigarettes    Quit date: 10/25/1978  . Smokeless  tobacco: Never Used  . Alcohol Use: Yes     Comment: occasional   . Drug Use: No  . Sexually Active: Not on file   Other Topics Concern  . Not on file   Social History Narrative  . No narrative on file     Constitutional: Denies fever, malaise, fatigue, headache or abrupt weight changes.  HEENT: Pt reports bloody nose. Denies eye pain, eye redness, ear pain, ringing in the ears, wax buildup, runny nose, nasal congestion, or sore throat. Neurological: Denies dizziness, difficulty with memory, difficulty with speech or problems with balance and coordination.   No other specific complaints in a complete review of systems (except as listed in HPI above).     Objective:   Physical Exam   BP 110/70  Pulse 73  Temp 97.8 F (36.6 C) (Oral)  SpO2 99% Wt Readings from Last 3 Encounters:  06/29/12 176 lb 2 oz (79.89 kg)  03/11/11 170 lb (77.111 kg)  11/10/10 175 lb 6.1 oz (79.552 kg)    General: Appears their stated age, well  developed, well nourished in NAD. Skin: Warm, dry and intact. No rashes, lesions or ulcerations noted. HEENT: Head: normal shape and size; Eyes: sclera white, no icterus, conjunctiva pink, PERRLA and EOMs intact; Ears: Tm's gray and intact, normal light reflex; Nose: mucosa pink and dry, septum midline, small ulceration noted on the medial side of the left nostril, scabbed over; Throat/Mouth: Teeth present, mucosa pink and moist, no exudate, lesions or ulcerations noted.   Cardiovascular: Normal rate and rhythm. S1,S2 noted.  No murmur, rubs or gallops noted. No JVD or BLE edema. No carotid bruits noted. Pulmonary/Chest: Normal effort and positive vesicular breath sounds. No respiratory distress. No wheezes, rales or ronchi noted.       Assessment & Plan:   Epistaxis, new onset with additional workup required:  Bactroban ointment to the left nostril 2 times per day Use saline nasal spray for the next 5 days May need a cool mist humidifier for your  home  RTC as need or if symptoms persist.

## 2012-10-25 HISTORY — PX: HEMORRHOID BANDING: SHX5850

## 2013-03-12 ENCOUNTER — Encounter: Payer: Self-pay | Admitting: Internal Medicine

## 2013-03-12 ENCOUNTER — Other Ambulatory Visit (INDEPENDENT_AMBULATORY_CARE_PROVIDER_SITE_OTHER): Payer: Medicare Other

## 2013-03-12 ENCOUNTER — Ambulatory Visit (INDEPENDENT_AMBULATORY_CARE_PROVIDER_SITE_OTHER): Payer: Medicare Other | Admitting: Internal Medicine

## 2013-03-12 VITALS — BP 150/90 | HR 75 | Temp 97.7°F | Wt 173.0 lb

## 2013-03-12 DIAGNOSIS — N3090 Cystitis, unspecified without hematuria: Secondary | ICD-10-CM

## 2013-03-12 DIAGNOSIS — N329 Bladder disorder, unspecified: Secondary | ICD-10-CM

## 2013-03-12 DIAGNOSIS — Z905 Acquired absence of kidney: Secondary | ICD-10-CM

## 2013-03-12 DIAGNOSIS — R3989 Other symptoms and signs involving the genitourinary system: Secondary | ICD-10-CM | POA: Insufficient documentation

## 2013-03-12 DIAGNOSIS — R109 Unspecified abdominal pain: Secondary | ICD-10-CM

## 2013-03-12 LAB — URINALYSIS, ROUTINE W REFLEX MICROSCOPIC
Ketones, ur: NEGATIVE
RBC / HPF: NONE SEEN (ref 0–?)
Specific Gravity, Urine: <=1.005 (ref 1.000–1.030)
Total Protein, Urine: NEGATIVE
Urine Glucose: NEGATIVE

## 2013-03-12 LAB — CBC WITH DIFFERENTIAL/PLATELET
Basophils Relative: 0.7 % (ref 0.0–3.0)
Eosinophils Relative: 1 % (ref 0.0–5.0)
HCT: 42.3 % (ref 36.0–46.0)
Hemoglobin: 14.4 g/dL (ref 12.0–15.0)
Lymphs Abs: 2.2 10*3/uL (ref 0.7–4.0)
MCV: 86.1 fl (ref 78.0–100.0)
Monocytes Absolute: 0.6 10*3/uL (ref 0.1–1.0)
Neutro Abs: 3.6 10*3/uL (ref 1.4–7.7)
Platelets: 318 10*3/uL (ref 150.0–400.0)
RBC: 4.91 Mil/uL (ref 3.87–5.11)
WBC: 6.6 10*3/uL (ref 4.5–10.5)

## 2013-03-12 LAB — BASIC METABOLIC PANEL
BUN: 12 mg/dL (ref 6–23)
Chloride: 103 mEq/L (ref 96–112)
Potassium: 5.6 mEq/L — ABNORMAL HIGH (ref 3.5–5.1)

## 2013-03-12 MED ORDER — NITROFURANTOIN MONOHYD MACRO 100 MG PO CAPS: 100.0000 mg | ORAL_CAPSULE | Freq: Two times a day (BID) | ORAL | Status: DC

## 2013-03-12 NOTE — Progress Notes (Signed)
Subjective:    Patient ID: Joan Townsend, female    DOB: 04-12-1941, 72 y.o.   MRN: [id number]  HPI here to f/u, seen per MD at [facility] with fever/bladder infection, tx with levoquin 250 x 5 days, to finish today.  Asked her to f/u here to make sure f/u, no further high fevers, Denies urinary symptoms such as dysuria, frequency, urgency, flank pain, hematuria or n/v, fever, chills.  First bladder infection since 1993 bladder surgury. Does have some llq/lower abd pain pain though some improved, still persists  Took last levoflox 250 yesterday .  BP at home always < 140/90 Past Medical History  Diagnosis Date  . Hemorrhoids   . Endometriosis   . History of scarlet fever   . Hypothyroidism   . Hepatitis     result of Scarlet fever  . HYPOTHYROIDISM 01/05/2011    Qualifier: Diagnosis of  By: Leone Payor MD, Charlyne Quale Acquired solitary kidney     s/p right nephrectomy  . Hyperlipidemia 06/29/2012   Past Surgical History  Procedure Laterality Date  . Appendectomy    . Abdominal hysterectomy    . Colon resection      part of endometriosis surgery  . Nephrectomy      right, due to birth deformity  . Colonoscopy  2006    hemorhoids (Dr. Virginia Rochester)    reports that she quit smoking about 34 years ago. Her smoking use included Cigarettes. She smoked 0.00 packs per day. She has never used smokeless tobacco. She reports that  drinks alcohol. She reports that she does not use illicit drugs. family history includes Breast cancer in an unspecified family member; Colon cancer in an unspecified family member; Heart disease in her maternal grandfather; Ovarian cancer in an unspecified family member; and Uterine cancer in her mother. Allergies  Allergen Reactions  . Ciprofloxacin     REACTION: rash  . Penicillins     REACTION: rash   Current Outpatient Prescriptions on File Prior to Visit  Medication Sig Dispense Refill  . AMBULATORY NON FORMULARY MEDICATION Potassium Iodine and Calcium Tablet Take  1 tablet by mouth once daily        . estradiol (ESTRACE) 1 MG tablet Take 1 mg by mouth daily.        . hydrocortisone (ANUSOL-HC) 25 MG suppository INSERT 1 PER RECTUM NIGHTLY X 7 NIGHTS THEN AS NEEDED  21 suppository  0  . Multiple Vitamin (MULTIVITAMIN PO) Take 1 tablet by mouth daily.        . mupirocin ointment (BACTROBAN) 2 % Apply topically 3 (three) times daily.  22 g  0  . SYNTHROID 88 MCG tablet Take 1 tablet (88 mcg total) by mouth daily.  90 tablet  3   No current facility-administered medications on file prior to visit.   Review of Systems  Constitutional: Negative for unexpected weight change, or unusual diaphoresis  HENT: Negative for tinnitus.   Eyes: Negative for photophobia and visual disturbance.  Respiratory: Negative for choking and stridor.   Gastrointestinal: Negative for vomiting and blood in stool.  Genitourinary: Negative for hematuria and decreased urine volume.  Musculoskeletal: Negative for acute joint swelling Skin: Negative for color change and wound.  Neurological: Negative for tremors and numbness other than noted  Psychiatric/Behavioral: Negative for decreased concentration or  hyperactivity.       Objective:   Physical Exam BP 150/90  Pulse 75  Temp(Src) 97.7 F (36.5 C) (Oral)  Wt 173  lb (78.472 kg)  BMI 28.14 kg/m2  SpO2 97% VS noted,  Constitutional: Pt appears well-developed and well-nourished.  HENT: Head: NCAT.  Right Ear: External ear normal.  Left Ear: External ear normal.  Eyes: Conjunctivae and EOM are normal. Pupils are equal, round, and reactive to light.  Neck: Normal range of motion. Neck supple.  Cardiovascular: Normal rate and regular rhythm.   Pulmonary/Chest: Effort normal and breath sounds normal.  Abd:  Soft, non-distended, + BS but with surprising low mid/llq tender mild to mod with mild palpation, no guarding; no flank tender Neurological: Pt is alert. Not confused  Skin: Skin is warm. No erythema.  Psychiatric: Pt  behavior is normal. Thought content normal.     Assessment & Plan:

## 2013-03-12 NOTE — Assessment & Plan Note (Signed)
See d/w cystitis,  to f/u any worsening symptoms or concerns, consider CT r/o diverticulitis or empiric flagyl

## 2013-03-12 NOTE — Patient Instructions (Addendum)
Please take all new medication as prescribed - the macrobid (nitrofurantoin) antibiotic Please continue all other medications as before Please have the pharmacy call with any other refills you may need. Please go to the LAB in the Basement (turn left off the elevator) for the tests to be done today  You will be contacted by phone if any changes need to be made immediately.  Otherwise, you will receive a letter about your results with an explanation You will be contacted regarding the referral for: urology  Please remember to sign up for My Chart if you have not done so, as this will be important to you in the future with finding out test results, communicating by private email, and scheduling acute appointments online when needed.

## 2013-03-12 NOTE — Assessment & Plan Note (Signed)
No flank tender, but has abd tender - ? Early pyelo, for eval and tx as above

## 2013-03-12 NOTE — Assessment & Plan Note (Signed)
Per pt report, no records available to review, s/p 5 days levoflox in the setting of prior bladder surgury and solitary left kidney;  Given the pain and tenderness suspect suboptimal tx for possible UTI;  Has cipro allergy listed, and mentions now she recalls a sulfa allergy from 20 yrs ago, also with PCN allergy, and no known multi-drug resist pathogen, so today for macrobid course, re-check urine studies, also refer urology per pt request;  i did mention diff would include diverticulitis (no prior hx) but would hold on CT with contrast given solitary kidney pending urine studies and further clinical data

## 2013-03-13 LAB — URINE CULTURE: Colony Count: NO GROWTH

## 2013-03-14 ENCOUNTER — Telehealth: Payer: Self-pay

## 2013-03-14 NOTE — Telephone Encounter (Signed)
Patient calls stating she talked with you earlier and she would like you to call her back.

## 2013-03-14 NOTE — Progress Notes (Signed)
i cant say exactly about where the infection came from, but OK to cancel the appt with Dr Isabel Caprice unless she has another infection.  I think the tenderness now was probably left over inflammation during the resolving process of the UTI

## 2013-03-14 NOTE — Telephone Encounter (Signed)
Called the patient and she was returning a phone call from this morning and have already addressed with her.  Patient is aware

## 2013-05-30 ENCOUNTER — Other Ambulatory Visit: Payer: Self-pay

## 2013-06-01 ENCOUNTER — Telehealth: Payer: Self-pay | Admitting: Internal Medicine

## 2013-06-01 NOTE — Telephone Encounter (Signed)
Spoke to patient and told her she would need an office visit as it was 2012 when last seen by Dr. Leone Payor.  She said she wasn't having problems with hemorrhoids currently just wants on hand to use as needed.  She reports she has seen Dr. Jonny Ruiz in May.  I told her she could ask if he would send refill since he has seen her this year.  She hung up the phone on me before I had a chance to offer an appointment with the extenders for 06/04/13.

## 2013-06-04 ENCOUNTER — Other Ambulatory Visit: Payer: Self-pay | Admitting: Internal Medicine

## 2013-06-04 ENCOUNTER — Telehealth: Payer: Self-pay | Admitting: *Deleted

## 2013-06-04 ENCOUNTER — Telehealth: Payer: Self-pay | Admitting: Internal Medicine

## 2013-06-04 NOTE — Telephone Encounter (Signed)
[facility] Triage Call Report Triage Record Num: [id number] Operator: Chevis Pretty Patient Name: Joan Townsend Call Date & Time: 06/01/2013 5:51:39PM Patient Phone: [phone] PCP: Oliver Barre Patient Gender: Female PCP Fax : [phone] Patient DOB: 11-22-1940 Practice Name: [facility] Reason for Call: Caller: Aislinn/Patient; PCP: Oliver Barre (Adults only); CB#: [phone]; Call regarding Hemorrhoids Rx; states Dr. Elenore Paddy had given her a prescription for hemorrhoid cream but states this has flared, and she wants a refill. States she used to be taking Anusol HC and needs more. States she spoke with Dr. Radene Journey office early AM 06/01/13, but was unable to be assisted since the office staff did return her call until after 5pm. States RN is the 5th person she has spoken with, and no one is able to get her prescription refilled. States has pain when passing BM, and some bleeding noted on wiping and on surface of stool. Denies emergent symptoms per rectal symtpoms protocol; advised appt within 72 hours. Offered appt in AM 06/02/13; declines. Patient very angry and hung up on RN. Protocol(s) Used: Rectal Symptoms Recommended Outcome per Protocol: See Provider within 72 Hours Reason for Outcome: Sharp rectal pain lasting minutes to hours, brought on by bowel movement; may have small amount of bleeding AND not previously evaluated or not responding to provider recommended treatment Care Advice: ~ Sit in a warm bath 20 minutes 3 or 4 times a day. ~ Call provider if symptoms worsen or new symptoms develop. Rectal pain or change in bowel habits including recurrent episodes or persistent constipation or constipation alternating with diarrhea, can be the only sign of a serious problem, such as polyps or a tumor. An evaluation by provider must be scheduled. ~ ~ SYMPTOM / CONDITION MANAGEMENT ~ CAUTIONS Gently clean rectal area after bowel movement using unscented moist flushable wipes,  moistened toilet tissue, or cotton balls soaked in witch hazel rather than dry, perfumed or colored toilet tissue. Avoid use of any wipes containing alcohol. ~ ~ Avoid standing or sitting for long periods. ~ Do not sit on toilet for a prolonged time (reading while on toilet) and avoid straining at stool. Analgesic/Antipyretic Advice - Acetaminophen: Consider acetaminophen as directed on label or by pharmacist/provider for pain or fever PRECAUTIONS: - Use if there is no history of liver disease, alcoholism, or intake of three or more alcohol drinks per day - Only if approved by provider during pregnancy or when breastfeeding - During pregnancy, acetaminophen should not be taken more than 3 consecutive days without telling provider - Do not exceed recommended dose or frequency ~ Analgesic/Antipyretic Advice - NSAIDs: Consider aspirin, ibuprofen, naproxen or ketoprofen for pain or fever as directed on label or by pharmacist/provider. PRECAUTIONS: - If over 61 years of age, should not take longer than 1 week without consulting provider. ~ 06/01/2013 6:03:31PM Page 1 of 2 [id number] [facility] Triage Call Report Patient Name: Joan Townsend continuation page/s EXCEPTIONS: - Should not be used if taking blood thinners or have bleeding problems. - Do not use if have history of sensitivity/allergy to any of these medications; or history of cardiovascular, ulcer, kidney, liver disease or diabetes unless approved by provider. - Do not exceed recommended dose or frequency. Consider use of nonprescription anti-inflammatory topical medication per pharmacist's or label recommendations (such as Preparation H, Anusol-HC, Cortacet) to relieve itching or burning. Apply small amount to area. ~ Constipation Care Measures: Drink 8 to 10 glasses of liquid per day, more if breastfeeding. - Drink warm water or coffee early  in the morning. - Gradually increase dietary fiber (fresh fruits/vegetables,  whole grain bread and cereals). - As tolerated, walk 30 minutes at a steady pace daily. - Consider nonprescription stool softeners (Colace) per label, pharmacist or provider recommendations. Stool softners are not habit forming as some stimulant laxatives may become. - Consider nonprescription bulk forming laxatives (such as Metamucil, FiberCon, Citrucel, etc.); follow package directions. - Avoid routine use of strong laxatives/enemas/suppositories unless ordered by provider. - Do not delay having bowel movement when having urge. - Keep a routine; attempt bowel movement within half hour after a meal or after some exercise. ~ 08/

## 2013-06-04 NOTE — Telephone Encounter (Signed)
Patient reports that she understands that in order to treat her hemorrhoids she will need an office visit.  She states that she does not want to be seen.  I have offered her an appt for today or tomorrow and she refuses.  She will call back if she changes her mind.

## 2013-07-06 ENCOUNTER — Encounter: Payer: Self-pay | Admitting: *Deleted

## 2013-07-18 ENCOUNTER — Encounter: Payer: Self-pay | Admitting: Internal Medicine

## 2013-07-18 ENCOUNTER — Ambulatory Visit (INDEPENDENT_AMBULATORY_CARE_PROVIDER_SITE_OTHER): Payer: Medicare Other | Admitting: Internal Medicine

## 2013-07-18 VITALS — BP 140/72 | HR 76 | Ht 67.0 in | Wt 172.4 lb

## 2013-07-18 DIAGNOSIS — K641 Second degree hemorrhoids: Secondary | ICD-10-CM | POA: Insufficient documentation

## 2013-07-18 DIAGNOSIS — K648 Other hemorrhoids: Secondary | ICD-10-CM

## 2013-07-18 NOTE — Progress Notes (Signed)
  Subjective:    Patient ID: Joan Townsend, female    DOB: 09/18/1941, 72 y.o.   MRN: [id number]  HPI The patient is here for follow-up of rectal bleeding from known hemorrhoids. Recently called with a flare and was prescribed Anusol HC suppositories - treated the bleeding and hemorrhoids successfully. She says she was moving some furniture before the bleeding started. She denies straining to stool or constipation. Generally defecates qod without prolonged sitting. She has had to treat the hemorrhoids with steroid suppositories intermittently over the years. She says the started when she had colonoscopy in 2006. Does have some external tags that can make cleansing difficult.  Medications, allergies, past medical history, past surgical history, family history and social history are reviewed and updated in the EMR.  Review of Systems As above    Objective:   Physical Exam  Female staff present  Rectal shows swollen fleshy anal tags - greatest in LL position. Normal tone at rest, no mas, small rectocele.   PROCEDURE NOTE: The patient presents with symptomatic grade 1- 2  hemorrhoids, requesting rubber band ligation of her hemorrhoidal disease.  All risks, benefits and alternative forms of therapy were described and informed consent was obtained.    In the Left Lateral Decubitus position  anoscopic examination revealed grade 1-2 hemorrhoids in the all positions.  The decision was made to band the  LL internal hemorrhoid, and the [facility] O'Regan System was used to perform band ligation without complication.  Digital anorectal examination was then performed to assure proper positioning of the band, and to adjust the banded tissue as required.  The patient was discharged home without pain or other issues.  Dietary and behavioral recommendations were given and along with follow-up instructions.  The patient will return in  2  weeks/months or as needed} for follow-up and possible additional banding as  required. No complications were encountered and the patient tolerated the procedure well.     Assessment & Plan:  Internal hemorrhoids with Grade 2 prolapse and bleeding  BJ:YNWGN Jonny Ruiz, MD

## 2013-07-18 NOTE — Assessment & Plan Note (Signed)
LL banded today Use benefiber RTC 2 weeks and band again - RA vs RP

## 2013-07-18 NOTE — Patient Instructions (Addendum)
HEMORRHOID BANDING PROCEDURE    FOLLOW-UP CARE   1. The procedure you have had should have been relatively painless since the banding of the area involved does not have nerve endings and there is no pain sensation.  The rubber band cuts off the blood supply to the hemorrhoid and the band may fall off as soon as 48 hours after the banding (the band may occasionally be seen in the toilet bowl following a bowel movement). You may notice a temporary feeling of fullness in the rectum which should respond adequately to plain Tylenol or Motrin.  2. Following the banding, avoid strenuous exercise that evening and resume full activity the next day.  A sitz bath (soaking in a warm tub) or bidet is soothing, and can be useful for cleansing the area after bowel movements.     3. To avoid constipation, take two tablespoons of natural wheat bran, natural oat bran, flax, Benefiber or any over the counter fiber supplement and increase your water intake to 7-8 glasses daily.    4. Unless you have been prescribed anorectal medication, do not put anything inside your rectum for two weeks: No suppositories, enemas, fingers, etc.  5. Occasionally, you may have more bleeding than usual after the banding procedure.  This is often from the untreated hemorrhoids rather than the treated one.  Don't be concerned if there is a tablespoon or so of blood.  If there is more blood than this, lie flat with your bottom higher than your head and apply an ice pack to the area. If the bleeding does not stop within a half an hour or if you feel faint, call our office at [phone] or go to the emergency room.  6. Problems are not common; however, if there is a substantial amount of bleeding, severe pain, chills, fever or difficulty passing urine (very rare) or other problems, you should call us at [phone] or report to the nearest emergency room.  7. Do not stay seated continuously for more than 2-3 hours for a day or two  after the procedure.  Tighten your buttock muscles 10-15 times every two hours and take 10-15 deep breaths every 1-2 hours.  Do not spend more than a few minutes on the toilet if you cannot empty your bowel; instead re-visit the toilet at a later time.  Please use the Benefiber as a fiber supplement.  Handout given.  We will see you back October 8th 2014 at 10:15am.  I appreciate the opportunity to care for you.

## 2013-08-01 ENCOUNTER — Ambulatory Visit (INDEPENDENT_AMBULATORY_CARE_PROVIDER_SITE_OTHER): Payer: Medicare Other | Admitting: Internal Medicine

## 2013-08-01 ENCOUNTER — Encounter: Payer: Self-pay | Admitting: Internal Medicine

## 2013-08-01 VITALS — BP 118/62 | HR 84 | Ht 64.5 in | Wt 174.0 lb

## 2013-08-01 DIAGNOSIS — K648 Other hemorrhoids: Secondary | ICD-10-CM

## 2013-08-01 NOTE — Patient Instructions (Signed)
HEMORRHOID BANDING PROCEDURE    FOLLOW-UP CARE   1. The procedure you have had should have been relatively painless since the banding of the area involved does not have nerve endings and there is no pain sensation.  The rubber band cuts off the blood supply to the hemorrhoid and the band may fall off as soon as 48 hours after the banding (the band may occasionally be seen in the toilet bowl following a bowel movement). You may notice a temporary feeling of fullness in the rectum which should respond adequately to plain Tylenol or Motrin.  2. Following the banding, avoid strenuous exercise that evening and resume full activity the next day.  A sitz bath (soaking in a warm tub) or bidet is soothing, and can be useful for cleansing the area after bowel movements.     3. To avoid constipation, take two tablespoons of natural wheat bran, natural oat bran, flax, Benefiber or any over the counter fiber supplement and increase your water intake to 7-8 glasses daily.    4. Unless you have been prescribed anorectal medication, do not put anything inside your rectum for two weeks: No suppositories, enemas, fingers, etc.  5. Occasionally, you may have more bleeding than usual after the banding procedure.  This is often from the untreated hemorrhoids rather than the treated one.  Don't be concerned if there is a tablespoon or so of blood.  If there is more blood than this, lie flat with your bottom higher than your head and apply an ice pack to the area. If the bleeding does not stop within a half an hour or if you feel faint, call our office at [phone] or go to the emergency room.  6. Problems are not common; however, if there is a substantial amount of bleeding, severe pain, chills, fever or difficulty passing urine (very rare) or other problems, you should call us at [phone] or report to the nearest emergency room.  7. Do not stay seated continuously for more than 2-3 hours for a day or two  after the procedure.  Tighten your buttock muscles 10-15 times every two hours and take 10-15 deep breaths every 1-2 hours.  Do not spend more than a few minutes on the toilet if you cannot empty your bowel; instead re-visit the toilet at a later time.    Follow up with us as needed.   I appreciate the opportunity to care for you.  

## 2013-08-01 NOTE — Progress Notes (Signed)
Patient ID: Joan Townsend, female   DOB: Feb 01, 1941, 72 y.o.   MRN: [id number]  PROCEDURE NOTE: The patient presents with symptomatic grade 1-2  hemorrhoids, requesting rubber band ligation of his/her hemorrhoidal disease.  All risks, benefits and alternative forms of therapy were described and informed consent was obtained.  S/p banding of LL internal hemorrhoid about 2 weeks ago.  The decision was made to band the RA and RP  internal hemorrhoids, and the [facility] O'Regan System was used to perform band ligation without complication.  Digital anorectal examination was then performed to assure proper positioning of the band, and to adjust the banded tissue as required.  The patient was discharged home without pain or other issues.  Dietary and behavioral recommendations were given and along with follow-up instructions.     The following adjunctive treatments were recommended:  Continue Benefiber  The patient will return as needed for  follow-up and possible additional banding as required. No complications were encountered and the patient tolerated the procedure well.

## 2013-08-01 NOTE — Assessment & Plan Note (Signed)
RA and RP banded today Has been asymptomatic since Tx with HC suppositories before initial banding so will have her follow-up prn

## 2013-08-30 ENCOUNTER — Other Ambulatory Visit: Payer: Self-pay

## 2013-10-03 ENCOUNTER — Telehealth: Payer: Self-pay | Admitting: Internal Medicine

## 2013-10-04 NOTE — Telephone Encounter (Signed)
Left message for patient to call back  

## 2013-10-04 NOTE — Telephone Encounter (Signed)
Left message for patient to call back Patient left a voicemail and asked that I call back on her cell [phone]

## 2013-10-05 NOTE — Telephone Encounter (Signed)
Patient had two hemorrhoidal bandings earlier this year.  She has had some minor rectal bleeding.  She will come in next Wed 10/10/13 10:15 to discuss 3rd banding

## 2013-10-10 ENCOUNTER — Ambulatory Visit (INDEPENDENT_AMBULATORY_CARE_PROVIDER_SITE_OTHER): Payer: Medicare Other | Admitting: Internal Medicine

## 2013-10-10 ENCOUNTER — Encounter: Payer: Self-pay | Admitting: Internal Medicine

## 2013-10-10 VITALS — BP 140/70 | HR 80 | Ht 65.0 in | Wt 177.0 lb

## 2013-10-10 DIAGNOSIS — K648 Other hemorrhoids: Secondary | ICD-10-CM

## 2013-10-10 NOTE — Assessment & Plan Note (Signed)
Some recurrent bleeding. Anoscopy showed persistent LL pile - banded again today

## 2013-10-10 NOTE — Patient Instructions (Signed)
HEMORRHOID BANDING PROCEDURE    FOLLOW-UP CARE   1. The procedure you have had should have been relatively painless since the banding of the area involved does not have nerve endings and there is no pain sensation.  The rubber band cuts off the blood supply to the hemorrhoid and the band may fall off as soon as 48 hours after the banding (the band may occasionally be seen in the toilet bowl following a bowel movement). You may notice a temporary feeling of fullness in the rectum which should respond adequately to plain Tylenol or Motrin.  2. Following the banding, avoid strenuous exercise that evening and resume full activity the next day.  A sitz bath (soaking in a warm tub) or bidet is soothing, and can be useful for cleansing the area after bowel movements.     3. To avoid constipation, take two tablespoons of natural wheat bran, natural oat bran, flax, Benefiber or any over the counter fiber supplement and increase your water intake to 7-8 glasses daily.    4. Unless you have been prescribed anorectal medication, do not put anything inside your rectum for two weeks: No suppositories, enemas, fingers, etc.  5. Occasionally, you may have more bleeding than usual after the banding procedure.  This is often from the untreated hemorrhoids rather than the treated one.  Don't be concerned if there is a tablespoon or so of blood.  If there is more blood than this, lie flat with your bottom higher than your head and apply an ice pack to the area. If the bleeding does not stop within a half an hour or if you feel faint, call our office at [phone] or go to the emergency room.  6. Problems are not common; however, if there is a substantial amount of bleeding, severe pain, chills, fever or difficulty passing urine (very rare) or other problems, you should call us at [phone] or report to the nearest emergency room.  7. Do not stay seated continuously for more than 2-3 hours for a day or two  after the procedure.  Tighten your buttock muscles 10-15 times every two hours and take 10-15 deep breaths every 1-2 hours.  Do not spend more than a few minutes on the toilet if you cannot empty your bowel; instead re-visit the toilet at a later time.    Call us back if you want a referral for the anal skin tag.   I appreciate the opportunity to care for you.

## 2013-10-10 NOTE — Progress Notes (Signed)
Patient ID: Joan Townsend, female   DOB: Sep 03, 1941, 72 y.o.   MRN: [id number]        PROCEDURE NOTE: The patient presents with symptomatic grade 1-2  hemorrhoids, requesting rubber band ligation of his/her hemorrhoidal disease.  All risks, benefits and alternative forms of therapy were described and informed consent was obtained. Chaperone present.  In the Left Lateral Decubitus position anoscopic examination revealed grade 1 hemorrhoids in the LL positions.   The decision was made to band the LL internal hemorrhoid, and the [facility] O'Regan System was used to perform band ligation without complication.  Digital anorectal examination was then performed to assure proper positioning of the band, and to adjust the banded tissue as required.  The patient was discharged home without pain or other issues.  Dietary and behavioral recommendations were given and along with follow-up instructions.      The patient will return prn for  follow-up and possible additional banding as required. No complications were encountered and the patient tolerated the procedure well.  She will call back about a referral to have anal tags removed.

## 2013-10-31 ENCOUNTER — Ambulatory Visit: Payer: Medicare Other | Admitting: Internal Medicine

## 2014-01-14 ENCOUNTER — Other Ambulatory Visit (INDEPENDENT_AMBULATORY_CARE_PROVIDER_SITE_OTHER): Payer: Medicare HMO

## 2014-01-14 ENCOUNTER — Encounter: Payer: Self-pay | Admitting: Internal Medicine

## 2014-01-14 ENCOUNTER — Ambulatory Visit (INDEPENDENT_AMBULATORY_CARE_PROVIDER_SITE_OTHER): Payer: Medicare HMO | Admitting: Internal Medicine

## 2014-01-14 VITALS — BP 160/80 | HR 82 | Temp 97.9°F | Resp 15 | Wt 177.8 lb

## 2014-01-14 DIAGNOSIS — B002 Herpesviral gingivostomatitis and pharyngotonsillitis: Secondary | ICD-10-CM

## 2014-01-14 DIAGNOSIS — R030 Elevated blood-pressure reading, without diagnosis of hypertension: Secondary | ICD-10-CM

## 2014-01-14 DIAGNOSIS — E039 Hypothyroidism, unspecified: Secondary | ICD-10-CM

## 2014-01-14 DIAGNOSIS — A084 Viral intestinal infection, unspecified: Secondary | ICD-10-CM

## 2014-01-14 DIAGNOSIS — A088 Other specified intestinal infections: Secondary | ICD-10-CM

## 2014-01-14 LAB — CBC WITH DIFFERENTIAL/PLATELET
BASOS ABS: 0 10*3/uL (ref 0.0–0.1)
BASOS PCT: 0.6 % (ref 0.0–3.0)
EOS PCT: 2.2 % (ref 0.0–5.0)
Eosinophils Absolute: 0.2 10*3/uL (ref 0.0–0.7)
HEMATOCRIT: 43.4 % (ref 36.0–46.0)
HEMOGLOBIN: 14.3 g/dL (ref 12.0–15.0)
LYMPHS PCT: 22.9 % (ref 12.0–46.0)
Lymphs Abs: 1.6 10*3/uL (ref 0.7–4.0)
MCHC: 33 g/dL (ref 30.0–36.0)
MCV: 87.3 fl (ref 78.0–100.0)
MONOS PCT: 8 % (ref 3.0–12.0)
Monocytes Absolute: 0.6 10*3/uL (ref 0.1–1.0)
NEUTROS ABS: 4.6 10*3/uL (ref 1.4–7.7)
Neutrophils Relative %: 66.3 % (ref 43.0–77.0)
Platelets: 285 10*3/uL (ref 150.0–400.0)
RBC: 4.97 Mil/uL (ref 3.87–5.11)
RDW: 14.6 % (ref 11.5–14.6)
WBC: 6.9 10*3/uL (ref 4.5–10.5)

## 2014-01-14 MED ORDER — ACYCLOVIR 5 % EX OINT: TOPICAL_OINTMENT | CUTANEOUS | Status: DC

## 2014-01-14 NOTE — Progress Notes (Signed)
   Subjective:    Patient ID: Joan Townsend, female    DOB: Apr 24, 1941, 73 y.o.   MRN: [id number]  HPI   She has had some blisterlike lesions around the mouth as well as intraorally and intranasally since 01/04/14.  Initially as of  3/20 she's had loose to watery stools and some nausea. She started probiotic with some improvement in her symptoms. Symptoms like this have occurred @ tax season before; she prepares tax forms professionally.  She does have night sweats; she is on estradiol.  Her Gynecologist checked her thyroid functions. The TSH was 0.96 and her free T4 was 1.21, both therapeutic      Review of Systems She questions some thyroid dysfunction despite TFTs results; as she's had some loss of eyebrows.  She denies associated fever, chills, or weight loss.  She has had some nonproductive cough.     Objective:   Physical Exam Gen.: Healthy and well-nourished in appearance. Alert, appropriate and cooperative throughout exam.Appears younger than stated age  Head: Normocephalic without obvious abnormalities Eyes: No corneal or conjunctival inflammation noted. Pupils equal round reactive to light and accommodation. Extraocular motion intact.No proptosis or lid lag Nose: External nasal exam reveals no deformity or inflammation. Nasal mucosa are pink and moist. Dry bland eschar type lesions L nare noted.   Mouth: Oral mucosa and oropharynx reveal small dry  lesions @ vermillion border. Teeth in good repair. Osteoma hard palate Neck: No deformities, masses, or tenderness noted.  Thyroid normal Lungs: Normal respiratory effort; chest expands symmetrically. Lungs are clear to auscultation without rales, wheezes, or increased work of breathing. Heart: Normal rate and rhythm. Normal S1 and S2. No gallop, click, or rub. S4 w/o murmur. Abdomen: Bowel sounds normal; abdomen soft and nontender. No masses, organomegaly or hernias noted.                                     Musculoskeletal/extremities: No deformity or scoliosis noted of  the thoracic or lumbar spine.   No clubbing, cyanosis, edema, or significant extremity  deformity noted. Range of motion normal .Tone & strength normal. Hand joints normal.  Fingernail  health good.No onycholysis Able to lie down & sit up w/o help.  Vascular: Carotid, radial artery, dorsalis pedis and  posterior tibial pulses are full and equal. No bruits present. Neurologic: Alert and oriented x3. Deep tendon reflexes symmetrical and normal.  Gait normal  Skin: Intact without suspicious lesions or rashes. Lymph: No cervical, axillary lymphadenopathy present. Psych: Mood and affect are slightly anxious as multiple concerns discuissed. Normally interactive                                                                                        Assessment & Plan:  #1 herpetic stomatitis , resolving #2 hypothyroidism, corrected #3 enteritis, resolving #4 HTN #5 exogenous stress (taxpreparer) See AVS

## 2014-01-14 NOTE — Progress Notes (Signed)
Pre visit review using our clinic review tool, if applicable. No additional management support is needed unless otherwise documented below in the visit note. 

## 2014-01-14 NOTE — Patient Instructions (Addendum)
L. lysine, a natural supplement, may be effective for suppressing the fever blisters Vitamin C 2000 mg daily & Echinacea for 4-7 days. Report fever, exudate("pus") or progressive pain. Minimal Blood Pressure Goal= AVERAGE < 140/90;  Ideal is an AVERAGE < 135/85. This AVERAGE should be calculated from @ least 5-7 BP readings taken @ different times of day on different days of week. You should not respond to isolated BP readings , but rather the AVERAGE for that week .Please bring your  blood pressure cuff to office visits to verify that it is reliable.It  can also be checked against the blood pressure device at the pharmacy. Finger or wrist cuffs are not dependable; an arm cuff is.

## 2014-02-13 ENCOUNTER — Encounter: Payer: Self-pay | Admitting: Internal Medicine

## 2014-04-05 LAB — HM MAMMOGRAPHY

## 2014-06-05 ENCOUNTER — Ambulatory Visit: Payer: Medicare HMO

## 2014-06-05 ENCOUNTER — Encounter: Payer: Self-pay | Admitting: Internal Medicine

## 2014-06-05 ENCOUNTER — Ambulatory Visit (INDEPENDENT_AMBULATORY_CARE_PROVIDER_SITE_OTHER): Payer: Medicare HMO | Admitting: Internal Medicine

## 2014-06-05 VITALS — BP 152/90 | HR 76 | Temp 98.2°F | Ht 66.0 in | Wt 176.1 lb

## 2014-06-05 DIAGNOSIS — G4710 Hypersomnia, unspecified: Secondary | ICD-10-CM

## 2014-06-05 DIAGNOSIS — Z0000 Encounter for general adult medical examination without abnormal findings: Secondary | ICD-10-CM

## 2014-06-05 DIAGNOSIS — R3989 Other symptoms and signs involving the genitourinary system: Secondary | ICD-10-CM

## 2014-06-05 DIAGNOSIS — E039 Hypothyroidism, unspecified: Secondary | ICD-10-CM

## 2014-06-05 LAB — URINALYSIS, ROUTINE W REFLEX MICROSCOPIC
BILIRUBIN URINE: NEGATIVE
KETONES UR: NEGATIVE
Leukocytes, UA: NEGATIVE
Nitrite: NEGATIVE
Specific Gravity, Urine: <=1.005 — AB (ref 1.000–1.030)
Total Protein, Urine: NEGATIVE
URINE GLUCOSE: NEGATIVE
Urobilinogen, UA: 0.2 (ref 0.0–1.0)
pH: 6 (ref 5.0–8.0)

## 2014-06-05 NOTE — Progress Notes (Signed)
Subjective:    Patient ID: Joan Townsend, female    DOB: 1941/01/12, 73 y.o.   MRN: [id number]  HPI Here for wellness and f/u;  Overall doing ok;  Pt denies CP, worsening SOB, DOE, wheezing, orthopnea, PND, worsening LE edema, palpitations, dizziness or syncope.  Pt denies neurological change such as new headache, facial or extremity weakness.  Pt denies polydipsia, polyuria, or low sugar symptoms. Pt states overall good compliance with treatment and medications, good tolerability, and has been trying to follow lower cholesterol diet.  Pt denies worsening depressive symptoms, suicidal ideation or panic. No fever, night sweats, wt loss, loss of appetite, or other constitutional symptoms.  Pt states good ability with ADL's, has low fall risk, home safety reviewed and adequate, no other significant changes in hearing or vision, and only occasionally active with exercise. Still works doing taxes during tax season, with kiosk at [employer], never gets sick, declines all immunizations.  Uses hand gel and plenty of vitamins as well. Unfort has gained approx 20 lbs in the past yr, thinks it might be her thyroid, hard to lose again, went back to the armour thyroid b/c feels levothyroxin not as reliable batch to batch. Does c/o ongoing fatigue, and has signficant daytime hypersomnolence. Lives alone, feels drained most days, exhausted with short shopping trips.   Saw Dr Linna Darner mar 2015, tsh , free t4 normal, but did have thyroidmed changed per GYN about July 23 to armour thyroid.  Past Medical History  Diagnosis Date  . Hemorrhoids   . Endometriosis   . History of scarlet fever   . Hypothyroidism   . Hepatitis     result of Scarlet fever  . HYPOTHYROIDISM 01/05/2011    Qualifier: Diagnosis of  By: Carlean Purl MD, Dimas Millin Acquired solitary kidney     s/p right nephrectomy  . Hyperlipidemia 06/29/2012   Past Surgical History  Procedure Laterality Date  . Appendectomy    . Abdominal hysterectomy    . Colon  resection      part of endometriosis surgery  . Nephrectomy      right, due to birth deformity  . Colonoscopy  2006    hemorrhoids(Dr. Lajoyce Corners)  . Cholecystectomy  2012  . Hemorrhoid banding  2014    reports that she quit smoking about 35 years ago. Her smoking use included Cigarettes. She smoked 0.00 packs per day. She has never used smokeless tobacco. She reports that she drinks alcohol. She reports that she does not use illicit drugs. family history includes Breast cancer in an other family member; Colon cancer in an other family member; Heart disease in her maternal grandfather; Ovarian cancer in an other family member; Uterine cancer in her mother. Allergies  Allergen Reactions  . Ciprofloxacin     REACTION: rash  . Penicillins     REACTION: rash  . Sulfa Antibiotics     Hives   Current Outpatient Prescriptions on File Prior to Visit  Medication Sig Dispense Refill  . acyclovir ointment (ZOVIRAX) 5 % Every 4 hrs while awake prn  15 g  0  . Coenzyme Q10 (CO Q 10) 100 MG CAPS Take 1 capsule by mouth daily.      Marland Kitchen estradiol (ESTRACE) 1 MG tablet Take 1 mg by mouth daily.        . Multiple Vitamin (MULTIVITAMIN PO) Take 1 tablet by mouth daily.        No current facility-administered medications on file prior to  visit.    Review of Systems Constitutional: Negative for increased diaphoresis, other activity, appetite or other siginficant weight change  HENT: Negative for worsening hearing loss, ear pain, facial swelling, mouth sores and neck stiffness.   Eyes: Negative for other worsening pain, redness or visual disturbance.  Respiratory: Negative for shortness of breath and wheezing.   Cardiovascular: Negative for chest pain and palpitations.  Gastrointestinal: Negative for diarrhea, blood in stool, abdominal distention or other pain Genitourinary: Negative for hematuria, flank pain or change in urine volume.  Musculoskeletal: Negative for myalgias or other joint complaints.  Skin:  Negative for color change and wound.  Neurological: Negative for syncope and numbness. other than noted Hematological: Negative for adenopathy. or other swelling Psychiatric/Behavioral: Negative for hallucinations, self-injury, decreased concentration or other worsening agitation.      Objective:   Physical Exam BP 152/90  Pulse 76  Temp(Src) 98.2 F (36.8 C) (Oral)  Ht 5\' 6"  (1.676 m)  Wt 176 lb 2 oz (79.89 kg)  BMI 28.44 kg/m2  SpO2 97% VS noted,  Constitutional: Pt is oriented to person, place, and time. Appears well-developed and well-nourished.  Head: Normocephalic and atraumatic.  Right Ear: External ear normal.  Left Ear: External ear normal.  Nose: Nose normal.  Mouth/Throat: Oropharynx is clear and moist.  Eyes: Conjunctivae and EOM are normal. Pupils are equal, round, and reactive to light.  Neck: Normal range of motion. Neck supple. No JVD present. No tracheal deviation present.  Cardiovascular: Normal rate, regular rhythm, normal heart sounds and intact distal pulses.   Pulmonary/Chest: Effort normal and breath sounds without rales or wheezing  Abdominal: Soft. Bowel sounds are normal.  No HSM mild tender low mid abdomen, no guarding or rebound Musculoskeletal: Normal range of motion. Exhibits no edema.  Lymphadenopathy:  Has no cervical adenopathy.  Neurological: Pt is alert and oriented to person, place, and time. Pt has normal reflexes. No cranial nerve deficit. Motor grossly intact Skin: Skin is warm and dry. No rash noted.  Psychiatric:  Has anxius mood and affect. Behavior is normal.     Assessment & Plan:

## 2014-06-05 NOTE — Assessment & Plan Note (Signed)

## 2014-06-05 NOTE — Assessment & Plan Note (Signed)
For f/u lab at 60 days

## 2014-06-05 NOTE — Patient Instructions (Signed)
Please continue all other medications as before, and refills have been done if requested.  Please have the pharmacy call with any other refills you may need.  Please continue your efforts at being more active, low cholesterol diet, and weight control.  You are otherwise up to date with prevention measures today.  Please keep your appointments with your specialists as you may have planned  Please go to the LAB in the Basement (turn left off the elevator) for the tests to be done today - just the urine testing today, and the blood work then in 60 days  You will be contacted by phone if any changes need to be made immediately.  Otherwise, you will receive a letter about your results with an explanation, but please check with MyChart first.  Please remember to sign up for MyChart if you have not done so, as this will be important to you in the future with finding out test results, communicating by private email, and scheduling acute appointments online when needed.  Please return in 1 year for your yearly visit, or sooner if needed

## 2014-06-05 NOTE — Progress Notes (Signed)
Pre visit review using our clinic review tool, if applicable. No additional management support is needed unless otherwise documented below in the visit note. 

## 2014-06-05 NOTE — Assessment & Plan Note (Signed)
Declines pulm referral

## 2014-06-05 NOTE — Assessment & Plan Note (Signed)
?   Chronic IC, vs infectious - for UA today

## 2014-09-05 ENCOUNTER — Other Ambulatory Visit (INDEPENDENT_AMBULATORY_CARE_PROVIDER_SITE_OTHER): Payer: Medicare HMO

## 2014-09-05 ENCOUNTER — Telehealth: Payer: Self-pay | Admitting: Internal Medicine

## 2014-09-05 ENCOUNTER — Encounter: Payer: Self-pay | Admitting: Internal Medicine

## 2014-09-05 ENCOUNTER — Ambulatory Visit (INDEPENDENT_AMBULATORY_CARE_PROVIDER_SITE_OTHER): Payer: Medicare HMO | Admitting: Internal Medicine

## 2014-09-05 VITALS — BP 170/82 | HR 102 | Temp 98.3°F | Ht 66.0 in | Wt 181.4 lb

## 2014-09-05 DIAGNOSIS — Z0000 Encounter for general adult medical examination without abnormal findings: Secondary | ICD-10-CM

## 2014-09-05 DIAGNOSIS — E039 Hypothyroidism, unspecified: Secondary | ICD-10-CM | POA: Diagnosis not present

## 2014-09-05 DIAGNOSIS — R002 Palpitations: Secondary | ICD-10-CM | POA: Diagnosis not present

## 2014-09-05 DIAGNOSIS — E038 Other specified hypothyroidism: Secondary | ICD-10-CM | POA: Diagnosis not present

## 2014-09-05 DIAGNOSIS — I10 Essential (primary) hypertension: Secondary | ICD-10-CM

## 2014-09-05 LAB — HEPATIC FUNCTION PANEL
ALT: 11 U/L (ref 0–35)
AST: 13 U/L (ref 0–37)
Albumin: 3 g/dL — ABNORMAL LOW (ref 3.5–5.2)
Alkaline Phosphatase: 72 U/L (ref 39–117)
BILIRUBIN DIRECT: 0 mg/dL (ref 0.0–0.3)
Total Bilirubin: 0.3 mg/dL (ref 0.2–1.2)
Total Protein: 6.7 g/dL (ref 6.0–8.3)

## 2014-09-05 LAB — BASIC METABOLIC PANEL
BUN: 13 mg/dL (ref 6–23)
CHLORIDE: 108 meq/L (ref 96–112)
CO2: 21 mEq/L (ref 19–32)
Calcium: 9 mg/dL (ref 8.4–10.5)
Creatinine, Ser: 1.1 mg/dL (ref 0.4–1.2)
GFR: 51.9 mL/min — AB (ref >60.00)
Glucose, Bld: 101 mg/dL — ABNORMAL HIGH (ref 70–99)
POTASSIUM: 4.5 meq/L (ref 3.5–5.1)
SODIUM: 144 meq/L (ref 135–145)

## 2014-09-05 LAB — CBC WITH DIFFERENTIAL/PLATELET
BASOS ABS: 0.1 10*3/uL (ref 0.0–0.1)
Basophils Relative: 0.7 % (ref 0.0–3.0)
Eosinophils Absolute: 0.2 10*3/uL (ref 0.0–0.7)
Eosinophils Relative: 2.7 % (ref 0.0–5.0)
HEMATOCRIT: 39.5 % (ref 36.0–46.0)
HEMOGLOBIN: 13.1 g/dL (ref 12.0–15.0)
Lymphocytes Relative: 23.2 % (ref 12.0–46.0)
Lymphs Abs: 1.7 10*3/uL (ref 0.7–4.0)
MCHC: 33.2 g/dL (ref 30.0–36.0)
MCV: 86 fl (ref 78.0–100.0)
MONOS PCT: 8.4 % (ref 3.0–12.0)
Monocytes Absolute: 0.6 10*3/uL (ref 0.1–1.0)
NEUTROS ABS: 4.8 10*3/uL (ref 1.4–7.7)
Neutrophils Relative %: 65 % (ref 43.0–77.0)
Platelets: 254 10*3/uL (ref 150.0–400.0)
RBC: 4.59 Mil/uL (ref 3.87–5.11)
RDW: 14.1 % (ref 11.5–15.5)
WBC: 7.4 10*3/uL (ref 4.0–10.5)

## 2014-09-05 LAB — TSH: TSH: 1.11 u[IU]/mL (ref 0.35–4.50)

## 2014-09-05 LAB — LIPID PANEL
CHOL/HDL RATIO: 3
Cholesterol: 208 mg/dL — ABNORMAL HIGH (ref 0–200)
HDL: 61 mg/dL (ref >39.00)
LDL CALC: 121 mg/dL — AB (ref 0–99)
NonHDL: 147
Triglycerides: 131 mg/dL (ref 0.0–149.0)
VLDL: 26.2 mg/dL (ref 0.0–40.0)

## 2014-09-05 LAB — T3, FREE: T3, Free: 2.7 pg/mL (ref 2.3–4.2)

## 2014-09-05 LAB — T4, FREE: Free T4: 0.78 ng/dL (ref 0.60–1.60)

## 2014-09-05 MED ORDER — THYROID 60 MG PO TABS: 60.0000 mg | ORAL_TABLET | Freq: Every day | ORAL | Status: DC

## 2014-09-05 NOTE — Progress Notes (Signed)
Subjective:    Patient ID: Joan Townsend, female    DOB: 1941/07/12, 73 y.o.   MRN: [id number]  HPI  Here to f/u; overall doing ok,  Pt denies chest pain, increased sob or doe, wheezing, orthopnea, PND, increased LE swelling, dizziness or syncope, but with recurrent daily palpitations.  No prior eval, ECG reviewed as per emr - NSR.  Asks for thryoid check, trying to cut back on caffeine.   Pt denies polydipsia, polyuria, or low sugar symptoms such as weakness or confusion improved with po intake.  Pt denies new neurological symptoms such as new headache, or facial or extremity weakness or numbness.   Pt states overall good compliance with meds, has been trying to follow lower cholesterol, diabetic diet, with wt overall stable,  but little exercise however.  Very adamant she does not medical tx such as any new oral med today, such as low dose beta blocker for palp's, dilt or other med for HTN.  Plans to work on losing wt but frustrating.  Denies hyper or hypo thyroid symptoms such as voice, skin or hair change. Past Medical History  Diagnosis Date  . Hemorrhoids   . Endometriosis   . History of scarlet fever   . Hypothyroidism   . Hepatitis     result of Scarlet fever  . HYPOTHYROIDISM 01/05/2011    Qualifier: Diagnosis of  By: Carlean Purl MD, Dimas Millin Acquired solitary kidney     s/p right nephrectomy  . Hyperlipidemia 06/29/2012   Past Surgical History  Procedure Laterality Date  . Appendectomy    . Abdominal hysterectomy    . Colon resection      part of endometriosis surgery  . Nephrectomy      right, due to birth deformity  . Colonoscopy  2006    hemorrhoids(Dr. Lajoyce Corners)  . Cholecystectomy  2012  . Hemorrhoid banding  2014    reports that she quit smoking about 35 years ago. Her smoking use included Cigarettes. She smoked 0.00 packs per day. She has never used smokeless tobacco. She reports that she drinks alcohol. She reports that she does not use illicit drugs. family history  includes Breast cancer in an other family member; Colon cancer in an other family member; Heart disease in her maternal grandfather; Ovarian cancer in an other family member; Uterine cancer in her mother. Allergies  Allergen Reactions  . Ciprofloxacin     REACTION: rash  . Penicillins     REACTION: rash  . Sulfa Antibiotics     Hives   Current Outpatient Prescriptions on File Prior to Visit  Medication Sig Dispense Refill  . acyclovir ointment (ZOVIRAX) 5 % Every 4 hrs while awake prn 15 g 0  . Ascorbic Acid (VITAMIN C) 1000 MG tablet Take 1,000 mg by mouth daily.    . Cholecalciferol (VITAMIN D-3) 5000 UNITS TABS Take by mouth daily.    . Coenzyme Q10 (CO Q 10) 100 MG CAPS Take 1 capsule by mouth daily.    Marland Kitchen estradiol (ESTRACE) 1 MG tablet Take 1 mg by mouth daily.      . Multiple Vitamin (MULTIVITAMIN PO) Take 1 tablet by mouth daily.     Marland Kitchen POTASSIUM IODIDE, ANTIDOTE, PO Take by mouth daily.    . vitamin B-12 (CYANOCOBALAMIN) 1000 MCG tablet Take 1,000 mcg by mouth daily.     No current facility-administered medications on file prior to visit.   Review of Systems  Constitutional: Negative for unusual  diaphoresis or other sweats  HENT: Negative for ringing in ear Eyes: Negative for double vision or worsening visual disturbance.  Respiratory: Negative for choking and stridor.   Gastrointestinal: Negative for vomiting or other signifcant bowel change Genitourinary: Negative for hematuria or decreased urine volume.  Musculoskeletal: Negative for other MSK pain or swelling Skin: Negative for color change and worsening wound.  Neurological: Negative for tremors and numbness other than noted  Psychiatric/Behavioral: Negative for decreased concentration or agitation other than above       Objective:   Physical Exam BP 170/82 mmHg  Pulse 102  Temp(Src) 98.3 F (36.8 C) (Oral)  Ht 5\' 6"  (1.676 m)  Wt 181 lb 6 oz (82.271 kg)  BMI 29.29 kg/m2  SpO2 97% VS noted, non  toxic Constitutional: Pt appears well-developed, well-nourished.  HENT: Head: NCAT.  Right Ear: External ear normal.  Left Ear: External ear normal.  Eyes: . Pupils are equal, round, and reactive to light. Conjunctivae and EOM are normal Neck: Normal range of motion. Neck supple.  Cardiovascular: Normal rate and regular rhythm.  - no ectopy noted Pulmonary/Chest: Effort normal and breath sounds normal.  Neurological: Pt is alert. Not confused , motor grossly intact Skin: Skin is warm. No rash Psychiatric: Pt behavior is normal. No agitation. mild to mod nervous    Assessment & Plan:

## 2014-09-05 NOTE — Assessment & Plan Note (Signed)
Pt adamant she will not accept any oral med tx at this time BP Readings from Last 3 Encounters:  09/05/14 170/82  06/05/14 152/90  01/14/14 160/80

## 2014-09-05 NOTE — Assessment & Plan Note (Addendum)
ECG reviewed as per emr, etiology unclear, for echo, 24hr holter, o/w declines any medical tx or cardiology referral at this time, wants thyroid checked first today

## 2014-09-05 NOTE — Telephone Encounter (Signed)
Has been done now.  Can be changed later if needed

## 2014-09-05 NOTE — Assessment & Plan Note (Addendum)
stable overall by history and exam, recent data reviewed with pt, and pt to continue medical treatment as before,  to f/u any worsening symptoms or concerns Lab Results  Component Value Date   TSH 1.11 09/05/2014  ok for endo referral per pt request

## 2014-09-05 NOTE — Patient Instructions (Signed)
Your EKG was OK today  You will be contacted regarding the referral for: echocardiogram, and Holter monitor  You will be contacted regarding the referral for: endocrinology  Please continue all other medications as before, and refills have been done if requested.  Please have the pharmacy call with any other refills you may need.  Please continue your efforts at being more active, low cholesterol diet, and weight control.  You are otherwise up to date with prevention measures today.  Please keep your appointments with your specialists as you may have planned  Please go to the LAB in the Basement (turn left off the elevator) for the tests to be done today  You will be contacted by phone if any changes need to be made immediately.  Otherwise, you will receive a letter about your results with an explanation, but please check with MyChart first.  Please remember to sign up for MyChart if you have not done so, as this will be important to you in the future with finding out test results, communicating by private email, and scheduling acute appointments online when needed.  Please return in 6 months, or sooner if needed

## 2014-09-05 NOTE — Telephone Encounter (Signed)
-----   Message from [place] sent at 09/05/2014  4:33 PM EST ----- Please send in thyroid medication once results are done to [facility].

## 2014-09-05 NOTE — Progress Notes (Signed)
Pre visit review using our clinic review tool, if applicable. No additional management support is needed unless otherwise documented below in the visit note. 

## 2014-09-11 ENCOUNTER — Ambulatory Visit (INDEPENDENT_AMBULATORY_CARE_PROVIDER_SITE_OTHER): Payer: Medicare HMO | Admitting: Endocrinology

## 2014-09-11 ENCOUNTER — Encounter: Payer: Self-pay | Admitting: Endocrinology

## 2014-09-11 VITALS — BP 128/78 | HR 83 | Temp 97.8°F | Ht 66.5 in | Wt 178.0 lb

## 2014-09-11 DIAGNOSIS — R002 Palpitations: Secondary | ICD-10-CM

## 2014-09-11 NOTE — Patient Instructions (Signed)
Let's check a 24-HR urine test.  Here is a paper to bring to the lab.   Our goal is to do this on a day when you have the symptoms.  If you have no symptoms on the day of the collection, please let me know, so we can recheck it.

## 2014-09-11 NOTE — Progress Notes (Signed)
Subjective:    Patient ID: Joan Townsend, female    DOB: 1941/10/21, 73 y.o.   MRN: [id number]  HPI Pt states 3 mos of intermittent moderate palpitations in the chest (3-4 times per week), and assoc weight gain.  Past Medical History  Diagnosis Date  . Hemorrhoids   . Endometriosis   . History of scarlet fever   . Hypothyroidism   . Hepatitis     result of Scarlet fever  . HYPOTHYROIDISM 01/05/2011    Qualifier: Diagnosis of  By: Joan Purl MD, Joan Townsend Acquired solitary kidney     s/p right nephrectomy  . Hyperlipidemia 06/29/2012    Past Surgical History  Procedure Laterality Date  . Appendectomy    . Abdominal hysterectomy    . Colon resection      part of endometriosis surgery  . Nephrectomy      right, due to birth deformity  . Colonoscopy  2006    hemorrhoids(Dr. Lajoyce Townsend)  . Cholecystectomy  2012  . Hemorrhoid banding  2014    History   Social History  . Marital Status: Widowed    Spouse Name: N/A    Number of Children: N/A  . Years of Education: N/A   Occupational History  . retired    Social History Main Topics  . Smoking status: Former Smoker    Types: Cigarettes    Quit date: 10/25/1978  . Smokeless tobacco: Never Used  . Alcohol Use: Yes     Comment: occasional   . Drug Use: No  . Sexual Activity: Not on file   Other Topics Concern  . Not on file   Social History Narrative    Current Outpatient Prescriptions on File Prior to Visit  Medication Sig Dispense Refill  . acyclovir ointment (ZOVIRAX) 5 % Every 4 hrs while awake prn 15 g 0  . Ascorbic Acid (VITAMIN C) 1000 MG tablet Take 1,000 mg by mouth daily.    . Cholecalciferol (VITAMIN D-3) 5000 UNITS TABS Take by mouth daily.    . Coenzyme Q10 (CO Q 10) 100 MG CAPS Take 1 capsule by mouth daily.    Marland Kitchen estradiol (ESTRACE) 1 MG tablet Take 1 mg by mouth daily.      . Multiple Vitamin (MULTIVITAMIN PO) Take 1 tablet by mouth daily.     Marland Kitchen POTASSIUM IODIDE, ANTIDOTE, PO Take by mouth daily.      Marland Kitchen thyroid (ARMOUR) 60 MG tablet Take 1 tablet (60 mg total) by mouth daily. 90 tablet 3  . vitamin B-12 (CYANOCOBALAMIN) 1000 MCG tablet Take 1,000 mcg by mouth daily.     No current facility-administered medications on file prior to visit.    Allergies  Allergen Reactions  . Ciprofloxacin     REACTION: rash  . Penicillins     REACTION: rash  . Sulfa Antibiotics     Hives    Family History  Problem Relation Age of Onset  . Breast cancer      great aunts x2, cousin  . Ovarian cancer      aunt  . Uterine cancer Mother   . Colon cancer      great aunts x3  . Heart disease Maternal Grandfather     BP 128/78 mmHg  Pulse 83  Temp(Src) 97.8 F (36.6 C) (Oral)  Ht 5' 6.5" (1.689 m)  Wt 178 lb (80.74 kg)  BMI 28.30 kg/m2  SpO2 98%  Review of Systems denies flushing, n/v, syncope, diarrhea,  fever, edema, sob, visual loss, arthralgias, easy bruising, and excessive diaphoresis.  She has intermittent headache, anxiety, rhinorrhea, and facial pallor.      Objective:   Physical Exam VS: see vs page GEN: no distress HEAD: head: no deformity eyes: no periorbital swelling, no proptosis external nose and ears are normal mouth: no lesion seen.  No neurofibromata.   NECK: supple, thyroid is not enlarged CHEST WALL: no deformity LUNGS:  Clear to auscultation.   CV: reg rate and rhythm, no murmur ABD: abdomen is soft, nontender.  no hepatosplenomegaly.  not distended.  no hernia MUSCULOSKELETAL: muscle bulk and strength are grossly normal.  no obvious joint swelling.  gait is normal and steady EXTEMITIES: no deformity.  no ulcer on the feet.  feet are of normal color and temp.  no edema PULSES: dorsalis pedis intact bilat.  no carotid bruit NEURO:  cn 2-12 grossly intact.   readily moves all 4's.  sensation is intact to touch on the feet SKIN:  Normal texture and temperature.  No rash or suspicious lesion is visible.  Not diaphoretic.  She has a port-wine "stain" on the back of  the neck, but no cafe-au-lait spots.   NODES:  None palpable at the neck PSYCH: alert, well-oriented.  Does not appear anxious nor depressed.   i reviewed electrocardiogram: (09/05/14).    i have reviewed the following old records: Office note (09/05/14): Dr Joan Townsend also noted HTN.   Lab Results  Component Value Date   TSH 1.11 09/05/2014      Assessment & Plan:  Palpitations, new to me, uncertain etiology.  If 24-HR urine is normal on a day with sxs, no further endocrine w/u is needed.  HTN: intermittent.    Patient is advised the following: Patient Instructions  Let's check a 24-HR urine test.  Here is a paper to bring to the lab.   Our goal is to do this on a day when you have the symptoms.  If you have no symptoms on the day of the collection, please let me know, so we can recheck it.

## 2014-09-18 ENCOUNTER — Encounter: Payer: Self-pay | Admitting: Internal Medicine

## 2014-09-18 ENCOUNTER — Telehealth: Payer: Self-pay | Admitting: Internal Medicine

## 2014-09-18 NOTE — Telephone Encounter (Signed)
Call to schedule monitor same day as Echo on 09-24-14. Per patient she wants to cancel both appointments because she is seeing Dr. Loanne Drilling who thank it might be her thyroid.   If she need the tests she will call back to reschedule.

## 2014-09-24 ENCOUNTER — Other Ambulatory Visit (HOSPITAL_COMMUNITY): Payer: Medicare HMO

## 2014-09-26 ENCOUNTER — Telehealth: Payer: Self-pay | Admitting: Endocrinology

## 2014-09-26 ENCOUNTER — Telehealth: Payer: Self-pay | Admitting: Internal Medicine

## 2014-09-26 LAB — METANEPHRINES, URINE, 24 HOUR
METANEPH TOTAL UR: 342 ug/(24.h) (ref 224–832)
METANEPHRINES UR: 58 ug/(24.h) — AB (ref 90–315)
NORMETANEPHRINE 24H UR: 284 ug/(24.h) (ref 122–676)

## 2014-09-26 LAB — CATECHOLAMINES, FRACTIONATED, URINE, 24 HOUR
Calculated Total (E+NE): 36 ug/(24.h) (ref 26–121)
Creatinine, Urine mg/day-CATEUR: 0.89 g/(24.h) (ref 0.63–2.50)
Dopamine, 24 hr Urine: 172 ug/(24.h) (ref 52–480)
Norepinephrine, 24 hr Ur: 36 ug/(24.h) (ref 15–100)
Total Volume - CF 24Hr U: 2650 mL

## 2014-09-26 NOTE — Telephone Encounter (Signed)
i would continue with the echo and holter monitor (having these done); and consider cardiology referral if pt wants this

## 2014-09-26 NOTE — Telephone Encounter (Signed)
Pt also request phone call from Dr. Jenny Reichmann concern about the test result that she got from Dr. Loanne Drilling (which is within the normal rang). Pt want to to to know what else doctor Jenny Reichmann want to do because is concern and start working full time 10/07/14.

## 2014-09-26 NOTE — Telephone Encounter (Signed)
Patient stated that she need further explanation on message she received on my chart concerning a tumor.

## 2014-09-27 NOTE — Telephone Encounter (Signed)
See below. Pt is concerned about the comments under the Metanephrine 24 hour urine test.  Please advise, Thanks!

## 2014-09-27 NOTE — Telephone Encounter (Signed)
The patient does not want test or referral at this time as she thinks is a waist of her time and that her major concern is the 30 lb weight gain, what to do about that.  She states her heart rate is down to around 80 now.  Advise on what to do or OV back with PCP to evaluate

## 2014-09-27 NOTE — Telephone Encounter (Signed)
If you had symptoms on the day of the collection, no more testing is needed.  If not, please let me know, so we can recheck it.

## 2014-09-27 NOTE — Telephone Encounter (Signed)
The only way to lose weight is less calories in than out, so eating less and being more active is the key.  I have ordered the other tests is she wants, and I can refer to cardiology at any time, thanks

## 2014-09-27 NOTE — Telephone Encounter (Signed)
Pt stated she understood lab results.

## 2014-10-04 NOTE — Telephone Encounter (Signed)
Patient informed of MD instructions regarding weight loss.  The patient stated she would rather see Dr. Linna Darner at her next [place] regarding this problem.

## 2015-04-21 ENCOUNTER — Other Ambulatory Visit: Payer: Self-pay

## 2015-06-11 ENCOUNTER — Other Ambulatory Visit: Payer: Self-pay | Admitting: Internal Medicine

## 2015-06-11 NOTE — Telephone Encounter (Signed)
Ok to refill Sir? 

## 2015-06-12 ENCOUNTER — Telehealth: Payer: Self-pay | Admitting: Internal Medicine

## 2015-06-12 MED ORDER — HYDROCORTISONE ACETATE 25 MG RE SUPP: 25.0000 mg | Freq: Two times a day (BID) | RECTAL | Status: DC

## 2015-06-12 NOTE — Telephone Encounter (Signed)
Can do if she schedules f/u

## 2015-06-12 NOTE — Telephone Encounter (Signed)
Called [facility] and told them to cancel the Anusol rx we sent them eariler, that she wanted it to go to [facility] which is where we re-sent it to.

## 2015-06-12 NOTE — Telephone Encounter (Signed)
See phone note from 06/12/15.  She is scheduled for follow up with Dr. Carlean Purl for 08/01/15 .  She is reminded she needs to get a referral from her primary care prior to her follow up.  Rx for anusol sent.

## 2015-06-12 NOTE — Telephone Encounter (Signed)
She is scheduled for follow up with Dr. Carlean Purl for 08/01/15 .  She is reminded she needs to get a referral from her primary care prior to her follow up.  Rx for anusol sent.

## 2015-06-18 ENCOUNTER — Telehealth: Payer: Self-pay | Admitting: Internal Medicine

## 2015-06-18 NOTE — Telephone Encounter (Signed)
Left message on machine for pt to return my call and advise on reason for referral

## 2015-06-18 NOTE — Telephone Encounter (Signed)
Patient stated that she need a referral to the eye doctor Dr Lenda Kelp opthalmology, please advise

## 2015-06-23 ENCOUNTER — Telehealth: Payer: Self-pay | Admitting: Internal Medicine

## 2015-06-23 NOTE — Telephone Encounter (Signed)
Patient is requesting [facility] referral to [facility] opthalmology Dr. Kathrin Penner.  Patient had appointment but office canceled because they did not have referral.  Patient is going for a routine eye exam.

## 2015-06-24 NOTE — Telephone Encounter (Signed)
Joan Townsend Joan Townsend #[id number] valid 06/24/2015 - 12/21/2015 for 6 visits

## 2015-06-27 ENCOUNTER — Telehealth: Payer: Self-pay | Admitting: Internal Medicine

## 2015-06-27 ENCOUNTER — Encounter: Payer: Self-pay | Admitting: Internal Medicine

## 2015-06-27 ENCOUNTER — Ambulatory Visit (INDEPENDENT_AMBULATORY_CARE_PROVIDER_SITE_OTHER): Payer: Commercial Managed Care - HMO | Admitting: Internal Medicine

## 2015-06-27 VITALS — BP 116/68 | HR 80 | Ht 64.5 in | Wt 161.5 lb

## 2015-06-27 DIAGNOSIS — K648 Other hemorrhoids: Secondary | ICD-10-CM | POA: Diagnosis not present

## 2015-06-27 DIAGNOSIS — K641 Second degree hemorrhoids: Secondary | ICD-10-CM

## 2015-06-27 NOTE — Assessment & Plan Note (Signed)
RP banded RTC prn

## 2015-06-27 NOTE — Progress Notes (Signed)
Patient ID: Joan Townsend, female   DOB: Mar 20, 1941, 74 y.o.   MRN: [id number]  Cc: hemorrhoid swollen  Had to move to a hotel while houes being repaired this summer - had some constipation - hemorrhoids swollen again. Especially right protrusion. Aches. Back to benefiber Medications, allergies, past medical history, past surgical history, family history and social history are reviewed and updated in the EMR. BP 116/68 mmHg  Pulse 80  Ht 5' 4.5" (1.638 m)  Wt 161 lb 8 oz (73.256 kg)  BMI 27.30 kg/m2  Rectal: anal tags with prolapse of internal external on RP  Confirmed at anoscopy gr 2 RA internal hemorrhoids w/ external component   PROCEDURE NOTE: The patient presents with symptomatic grade 2 hemorrhoids, requesting rubber band ligation of his/her hemorrhoidal disease.  All risks, benefits and alternative forms of therapy were described and informed consent was obtained.    The decision was made to band the RP internal hemorrhoid, and the [facility] was used to perform band ligation without complication.  Digital anorectal examination was then performed to assure proper positioning of the band, and to adjust the banded tissue as required.  The patient was discharged home without pain or other issues.  Dietary and behavioral recommendations were given and along with follow-up instructions.     The following adjunctive treatments were recommended:  benefiber  The patient will return  as needed for  follow-up and possible additional banding as required. She is to call back at 1 month if not satisfied. No complications were encountered and the patient tolerated the procedure well.  Prolapsed internal hemorrhoids, grade 2 RP banded RTC prn

## 2015-06-27 NOTE — Patient Instructions (Addendum)
HEMORRHOID BANDING PROCEDURE    FOLLOW-UP CARE   1. The procedure you have had should have been relatively painless since the banding of the area involved does not have nerve endings and there is no pain sensation.  The rubber band cuts off the blood supply to the hemorrhoid and the band may fall off as soon as 48 hours after the banding (the band may occasionally be seen in the toilet bowl following a bowel movement). You may notice a temporary feeling of fullness in the rectum which should respond adequately to plain Tylenol or Motrin.  2. Following the banding, avoid strenuous exercise that evening and resume full activity the next day.  A sitz bath (soaking in a warm tub) or bidet is soothing, and can be useful for cleansing the area after bowel movements.     3. To avoid constipation, take two tablespoons of natural wheat bran, natural oat bran, flax, Benefiber or any over the counter fiber supplement and increase your water intake to 7-8 glasses daily.    4. Unless you have been prescribed anorectal medication, do not put anything inside your rectum for two weeks: No suppositories, enemas, fingers, etc.  5. Occasionally, you may have more bleeding than usual after the banding procedure.  This is often from the untreated hemorrhoids rather than the treated one.  Don't be concerned if there is a tablespoon or so of blood.  If there is more blood than this, lie flat with your bottom higher than your head and apply an ice pack to the area. If the bleeding does not stop within a half an hour or if you feel faint, call our office at [phone] or go to the emergency room.  6. Problems are not common; however, if there is a substantial amount of bleeding, severe pain, chills, fever or difficulty passing urine (very rare) or other problems, you should call us at [phone] or report to the nearest emergency room.  7. Do not stay seated continuously for more than 2-3 hours for a day or two  after the procedure.  Tighten your buttock muscles 10-15 times every two hours and take 10-15 deep breaths every 1-2 hours.  Do not spend more than a few minutes on the toilet if you cannot empty your bowel; instead re-visit the toilet at a later time.  Continue benfiber.   Call if not feeling better in one month.   Thank you for the opportunity to participate in your care.

## 2015-06-27 NOTE — Telephone Encounter (Signed)
Patient will come in today at 1:30.  She is reminded to make sure her referral is good for today at 1:30 from Dr. Jenny Reichmann

## 2015-08-01 ENCOUNTER — Ambulatory Visit: Payer: Medicare HMO | Admitting: Internal Medicine

## 2015-08-07 ENCOUNTER — Telehealth: Payer: Self-pay | Admitting: Internal Medicine

## 2015-08-07 NOTE — Telephone Encounter (Signed)
Ok to see Dr Linna Darner -

## 2015-08-07 NOTE — Telephone Encounter (Signed)
Spoke with [employer].  Patient will have to be seen.  Called patient back and informed.

## 2015-08-07 NOTE — Telephone Encounter (Signed)
Patient states she has a bladder infection.  She does not want to be seen by Dr. Jenny Reichmann.  She wants to go to the lab or to be referred over to [facility].  Please follow up with patient.

## 2015-08-07 NOTE — Telephone Encounter (Signed)
Pt called to check on this request, please advise, would like to see if Dr. Linna Darner can help with this. Please call pt back at [phone].

## 2015-08-08 ENCOUNTER — Encounter: Payer: Self-pay | Admitting: Internal Medicine

## 2015-08-08 ENCOUNTER — Ambulatory Visit (INDEPENDENT_AMBULATORY_CARE_PROVIDER_SITE_OTHER): Payer: Commercial Managed Care - HMO | Admitting: Internal Medicine

## 2015-08-08 ENCOUNTER — Other Ambulatory Visit: Payer: Commercial Managed Care - HMO

## 2015-08-08 ENCOUNTER — Other Ambulatory Visit (INDEPENDENT_AMBULATORY_CARE_PROVIDER_SITE_OTHER): Payer: Commercial Managed Care - HMO

## 2015-08-08 VITALS — BP 150/82 | HR 85 | Temp 97.8°F | Resp 15 | Wt 165.0 lb

## 2015-08-08 DIAGNOSIS — J310 Chronic rhinitis: Secondary | ICD-10-CM | POA: Diagnosis not present

## 2015-08-08 DIAGNOSIS — Z905 Acquired absence of kidney: Secondary | ICD-10-CM | POA: Diagnosis not present

## 2015-08-08 DIAGNOSIS — R509 Fever, unspecified: Secondary | ICD-10-CM

## 2015-08-08 DIAGNOSIS — R300 Dysuria: Secondary | ICD-10-CM | POA: Diagnosis not present

## 2015-08-08 LAB — CBC WITH DIFFERENTIAL/PLATELET
BASOS ABS: 0.2 10*3/uL — AB (ref 0.0–0.1)
Basophils Relative: 2.5 % (ref 0.0–3.0)
Eosinophils Absolute: 0.1 10*3/uL (ref 0.0–0.7)
Eosinophils Relative: 1.5 % (ref 0.0–5.0)
HCT: 42.1 % (ref 36.0–46.0)
Hemoglobin: 13.9 g/dL (ref 12.0–15.0)
LYMPHS ABS: 2 10*3/uL (ref 0.7–4.0)
Lymphocytes Relative: 27.3 % (ref 12.0–46.0)
MCHC: 32.9 g/dL (ref 30.0–36.0)
MCV: 87.6 fl (ref 78.0–100.0)
MONO ABS: 0.5 10*3/uL (ref 0.1–1.0)
MONOS PCT: 6.4 % (ref 3.0–12.0)
NEUTROS PCT: 62.3 % (ref 43.0–77.0)
Neutro Abs: 4.6 10*3/uL (ref 1.4–7.7)
Platelets: 273 10*3/uL (ref 150.0–400.0)
RBC: 4.81 Mil/uL (ref 3.87–5.11)
RDW: 14.6 % (ref 11.5–15.5)
WBC: 7.3 10*3/uL (ref 4.0–10.5)

## 2015-08-08 LAB — URINALYSIS
BILIRUBIN URINE: NEGATIVE
Ketones, ur: NEGATIVE
LEUKOCYTES UA: NEGATIVE
NITRITE: NEGATIVE
Specific Gravity, Urine: <=1.005 — AB (ref 1.000–1.030)
Total Protein, Urine: NEGATIVE
Urine Glucose: NEGATIVE
Urobilinogen, UA: 0.2 (ref 0.0–1.0)
pH: 6.5 (ref 5.0–8.0)

## 2015-08-08 LAB — BASIC METABOLIC PANEL
BUN: 13 mg/dL (ref 6–23)
CHLORIDE: 104 meq/L (ref 96–112)
CO2: 31 mEq/L (ref 19–32)
Calcium: 9.5 mg/dL (ref 8.4–10.5)
Creatinine, Ser: 1.08 mg/dL (ref 0.40–1.20)
GFR: 52.32 mL/min — ABNORMAL LOW (ref >60.00)
Glucose, Bld: 84 mg/dL (ref 70–99)
POTASSIUM: 4.6 meq/L (ref 3.5–5.1)
Sodium: 142 mEq/L (ref 135–145)

## 2015-08-08 NOTE — Progress Notes (Signed)
   Subjective:    Patient ID: Joan Townsend, female    DOB: 02-18-1941, 74 y.o.   MRN: [id number]  HPI  She has multiple complaints and is concerned that she may have urinary tract infection. She complains of joint pain, sinusitis, low-grade fever, and knots @ the base of her neck..  She's had some low back discomfort but she's been" lifting and pulling". She has slight discomfort with urination with pain at the termination of urination.  She states her normal temperature is 97 and her temperature recently has been 98-98.8.  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  Review of Systems She denies pyuria or hematuria. Frontal headache, facial pain , nasal purulence, dental pain, sore throat , otic pain or otic discharge denied. She denies any purulent sputum. No chills or sweats. She has no diarrhea. No rashes are present.    Objective:   Physical Exam Pertinent or positive findings include: There is erythema of the nasal mucosa especfially on the right. There may be a small keratotic polypoid change in the right nare. She has crepitus of the knees.  General appearance :adequately nourished; in no distress.  Eyes: No conjunctival inflammation or scleral icterus is present.  Oral exam:  Lips and gums are healthy appearing.There is no oropharyngeal erythema or exudate noted. Dental hygiene is good.  Heart:  Normal rate and regular rhythm. S1 and S2 normal without gallop, murmur, click, rub or other extra sounds    Lungs:Chest clear to auscultation; no wheezes, rhonchi,rales ,or rubs present.No increased work of breathing.   Abdomen: bowel sounds normal, soft and non-tender without masses, organomegaly or hernias noted.  No guarding or rebound. No flank tenderness to percussion.  Vascular : all pulses equal ; no bruits  present.  Skin:Warm & dry.  Intact without suspicious lesions or rashes ; no tenting or jaundice   Lymphatic: No lymphadenopathy is noted about the head, neck, axilla.   Neuro: Strength, tone & DTRs normal.     Assessment & Plan:  #1 subjective fever  #2 nasal mucosal inflammation  #3 terminal dysuria  Plan: See orders and recommendations

## 2015-08-08 NOTE — Patient Instructions (Signed)
Drink as much nondairy fluids as possible. Avoid spicy foods or alcohol as  these may aggravate the bladder . Do not take decongestants. Avoid narcotics if possible.  Your next office appointment will be determined based upon review of your pending labs .  Those written interpretation of the lab results and instructions will be transmitted to you by My Chart   Critical results will be called.   Followup as needed for any active or acute issue. Please report any significant change in your symptoms.

## 2015-08-08 NOTE — Progress Notes (Signed)
Pre visit review using our clinic review tool, if applicable. No additional management support is needed unless otherwise documented below in the visit note. 

## 2015-08-11 ENCOUNTER — Other Ambulatory Visit: Payer: Self-pay | Admitting: Internal Medicine

## 2015-08-11 DIAGNOSIS — N390 Urinary tract infection, site not specified: Secondary | ICD-10-CM

## 2015-08-11 DIAGNOSIS — B9620 Unspecified Escherichia coli [E. coli] as the cause of diseases classified elsewhere: Secondary | ICD-10-CM

## 2015-08-11 LAB — CULTURE, URINE COMPREHENSIVE

## 2015-08-11 MED ORDER — NITROFURANTOIN MONOHYD MACRO 100 MG PO CAPS: 100.0000 mg | ORAL_CAPSULE | Freq: Two times a day (BID) | ORAL | Status: DC

## 2015-08-14 ENCOUNTER — Other Ambulatory Visit: Payer: Self-pay | Admitting: Internal Medicine

## 2015-08-14 ENCOUNTER — Telehealth: Payer: Self-pay | Admitting: *Deleted

## 2015-08-14 NOTE — Telephone Encounter (Signed)
The 10 day course of nitrofurantoin would eradicate this urinary tract infection based on the sensitivity studies done.  If symptoms persist after completion of that 10 day course of medication; the urine should be recultured.

## 2015-08-14 NOTE — Telephone Encounter (Signed)
She should have completed the 10 day course of nitrofurantoin. Based on the culture and sensitivities this would eradicate the bacteria. If symptoms persist; she needs to have the urine cultured again.

## 2015-08-14 NOTE — Telephone Encounter (Signed)
Please advise 

## 2015-08-14 NOTE — Telephone Encounter (Signed)
Pt left msg on triage stating md rx antibiotic (Macrobid) for UTI she states md said it was bacterial so she gave her significant other some to take as well that way they don't keep passing it. She is wanting to get another refill so she can take the recommending coarse./lmb

## 2015-08-15 NOTE — Telephone Encounter (Signed)
Pt states she is wanting the refill for her husband. MD told her it was bacterial. Husband need to take as well so he will not pass it back to her...Johny Chess

## 2015-08-15 NOTE — Telephone Encounter (Signed)
This fortunately is not necessary, as everyone has bacteria on their body, and we only need to treat if the bacteria "act up" and are able to get past the person's natural defenses  If not fever, pain, dysuria, or other unsual symptoms, no treatment would be needed  If there fever and further concern, the husband should make OV, as we normally do not treat with antibx without OV, as this is consistent with good antibiotic stewardship (not overusing when not needed)

## 2015-08-16 ENCOUNTER — Other Ambulatory Visit: Payer: Self-pay | Admitting: Internal Medicine

## 2015-08-21 NOTE — Telephone Encounter (Signed)
appt made

## 2015-08-21 NOTE — Telephone Encounter (Signed)
Patient states that she has a fever of 101.7 and is wondering if the antibiotics are still working. She still has 3 more days left and is wondering if she should keep taking it. Please advise

## 2015-08-21 NOTE — Telephone Encounter (Signed)
Please make OV asap for fever and possible UTI

## 2015-08-22 ENCOUNTER — Other Ambulatory Visit: Payer: Commercial Managed Care - HMO

## 2015-08-22 ENCOUNTER — Encounter: Payer: Self-pay | Admitting: Internal Medicine

## 2015-08-22 ENCOUNTER — Ambulatory Visit (INDEPENDENT_AMBULATORY_CARE_PROVIDER_SITE_OTHER): Payer: Commercial Managed Care - HMO | Admitting: Internal Medicine

## 2015-08-22 VITALS — BP 138/82 | HR 80 | Temp 97.8°F | Ht 65.0 in | Wt 160.0 lb

## 2015-08-22 DIAGNOSIS — R300 Dysuria: Secondary | ICD-10-CM | POA: Diagnosis not present

## 2015-08-22 DIAGNOSIS — J019 Acute sinusitis, unspecified: Secondary | ICD-10-CM | POA: Diagnosis not present

## 2015-08-22 DIAGNOSIS — I10 Essential (primary) hypertension: Secondary | ICD-10-CM | POA: Diagnosis not present

## 2015-08-22 DIAGNOSIS — N390 Urinary tract infection, site not specified: Secondary | ICD-10-CM | POA: Insufficient documentation

## 2015-08-22 DIAGNOSIS — N3000 Acute cystitis without hematuria: Secondary | ICD-10-CM

## 2015-08-22 LAB — POCT URINALYSIS DIPSTICK
BILIRUBIN UA: NEGATIVE
GLUCOSE UA: NEGATIVE
KETONES UA: NEGATIVE
Leukocytes, UA: NEGATIVE
Nitrite, UA: NEGATIVE
PH UA: 6
Protein, UA: NEGATIVE
RBC UA: NEGATIVE
Spec Grav, UA: <=1.005
Urobilinogen, UA: 0.2

## 2015-08-22 MED ORDER — LEVOFLOXACIN 250 MG PO TABS: 250.0000 mg | ORAL_TABLET | Freq: Every day | ORAL | Status: DC

## 2015-08-22 NOTE — Progress Notes (Signed)
Pre visit review using our clinic review tool, if applicable. No additional management support is needed unless otherwise documented below in the visit note. 

## 2015-08-22 NOTE — Patient Instructions (Signed)
Please take all new medication as prescribed - the antibiotic  We will send the specimen for culture  Please continue all other medications as before, and refills have been done if requested.  Please have the pharmacy call with any other refills you may need.  You are otherwise up to date with prevention measures today.  Please keep your appointments with your specialists as you may have planned

## 2015-08-22 NOTE — Progress Notes (Signed)
Subjective:    Patient ID: Joan Townsend, female    DOB: 09/14/1941, 74 y.o.   MRN: [id number]  HPI  Here after recent uti, unfort with nausea related to macrobid, but did finish all but 3 pills. Denies urinary symptoms such as dysuria, frequency, urgency, flank pain, hematuria or n/v.  Unfortunately developed temp 101 x 2 days and  Here with 2-3 days acute onset fever, facial pain, pressure, headache, general weakness and malaise, and greenish d/c, with mild ST and cough, but pt denies chest pain, wheezing, increased sob or doe, orthopnea, PND, increased LE swelling, palpitations, dizziness or syncope. Pt denies new neurological symptoms such as new headache, or facial or extremity weakness or numbness  Past Medical History  Diagnosis Date  . Hemorrhoids   . Endometriosis   . History of scarlet fever   . Hypothyroidism   . Hepatitis     result of Scarlet fever  . HYPOTHYROIDISM 01/05/2011    Qualifier: Diagnosis of  By: Carlean Purl MD, Dimas Millin Acquired solitary kidney     s/p right nephrectomy  . Hyperlipidemia 06/29/2012   Past Surgical History  Procedure Laterality Date  . Appendectomy    . Abdominal hysterectomy    . Colon resection      part of endometriosis surgery  . Nephrectomy      right, due to birth deformity  . Colonoscopy  2006    hemorrhoids(Dr. Lajoyce Corners)  . Cholecystectomy  2012  . Hemorrhoid banding  2014    reports that she quit smoking about 36 years ago. Her smoking use included Cigarettes. She has never used smokeless tobacco. She reports that she drinks alcohol. She reports that she does not use illicit drugs. family history includes Breast cancer in an other family member; Colon cancer in an other family member; Heart disease in her maternal grandfather; Ovarian cancer in an other family member; Uterine cancer in her mother. Allergies  Allergen Reactions  . Ciprofloxacin     REACTION: rash  . Penicillins     REACTION: rash  . Sulfa Antibiotics     Hives    Current Outpatient Prescriptions on File Prior to Visit  Medication Sig Dispense Refill  . Ascorbic Acid (VITAMIN C) 1000 MG tablet Take 1,000 mg by mouth daily.    . Cholecalciferol (VITAMIN D-3) 5000 UNITS TABS Take by mouth daily.    . Coenzyme Q10 (CO Q 10) 100 MG CAPS Take 1 capsule by mouth daily.    Marland Kitchen estradiol (ESTRACE) 1 MG tablet Take 1 mg by mouth daily.      . hydrocortisone (ANUSOL-HC) 25 MG suppository Place 1 suppository (25 mg total) rectally every 12 (twelve) hours. 12 suppository 1  . IODINE, KELP, PO Take by mouth.    . levothyroxine (SYNTHROID, LEVOTHROID) 125 MCG tablet Take 125 mcg by mouth daily before breakfast.    . Magnesium 100 MG CAPS Take by mouth.    . Multiple Vitamins-Minerals (MULTI COMPLETE PO) Take by mouth.    [name] 150 MG CAPS Take by mouth.    Marland Kitchen POTASSIUM IODIDE, ANTIDOTE, PO Take by mouth daily.    . vitamin B-12 (CYANOCOBALAMIN) 1000 MCG tablet Take 1,000 mcg by mouth daily.    . nitrofurantoin, macrocrystal-monohydrate, (MACROBID) 100 MG capsule Take 1 capsule (100 mg total) by mouth 2 (two) times daily. (Patient not taking: Reported on 08/22/2015) 20 capsule 0   No current facility-administered medications on file prior to visit.  Review of Systems  Constitutional: Negative for unusual diaphoresis or night sweats HENT: Negative for ringing in ear or discharge Eyes: Negative for double vision or worsening visual disturbance.  Respiratory: Negative for choking and stridor.   Gastrointestinal: Negative for vomiting or other signifcant bowel change Genitourinary: Negative for hematuria or change in urine volume.  Musculoskeletal: Negative for other MSK pain or swelling Skin: Negative for color change and worsening wound.  Neurological: Negative for tremors and numbness other than noted  Psychiatric/Behavioral: Negative for decreased concentration or agitation other than above       Objective:   Physical Exam BP 138/82 mmHg   Pulse 80  Temp(Src) 97.8 F (36.6 C) (Oral)  Ht 5\' 5"  (1.651 m)  Wt 160 lb (72.576 kg)  BMI 26.63 kg/m2  SpO2 96% VS noted, mild ill Constitutional: Pt appears in no significant distress HENT: Head: NCAT.  Right Ear: External ear normal.  Left Ear: External ear normal.  Bilat tm's with mild erythema.  Max sinus areas mild tender.  Pharynx with mild erythema, no exudate Eyes: . Pupils are equal, round, and reactive to light. Conjunctivae and EOM are normal Neck: Normal range of motion. Neck supple.  Cardiovascular: Normal rate and regular rhythm.   Pulmonary/Chest: Effort normal and breath sounds without rales or wheezing.  Abd:  Soft, NT, ND, + BS Neurological: Pt is alert. Not confused , motor grossly intact Skin: Skin is warm. No rash, no LE edema Psychiatric: Pt behavior is normal. No agitation.      Assessment & Plan:

## 2015-08-23 NOTE — Assessment & Plan Note (Signed)
UA dip neg, exam improved, ok to d/c macrobid, for urine cx f/u but suspect is resolved, no need for antbx for this purpose alone and pt reassured this is not needed for the purpose of not spreading to husband.

## 2015-08-23 NOTE — Assessment & Plan Note (Signed)
stable overall by history and exam, recent data reviewed with pt, and pt to continue medical treatment as before,  to f/u any worsening symptoms or concerns BP Readings from Last 3 Encounters:  08/22/15 138/82  08/08/15 150/82  06/27/15 116/68

## 2015-08-23 NOTE — Assessment & Plan Note (Signed)
Mild to mod, for antibx course,  to f/u any worsening symptoms or concerns 

## 2015-08-24 LAB — URINE CULTURE
COLONY COUNT: NO GROWTH
ORGANISM ID, BACTERIA: NO GROWTH

## 2015-09-08 ENCOUNTER — Telehealth: Payer: Self-pay | Admitting: Internal Medicine

## 2015-09-08 NOTE — Telephone Encounter (Signed)
Pt request [employer] referral autherization to go to Dr. Alton Revere (OBGYN in [place]). Please call pt once we send the authorization there and pt has an appt on 10/15/15.

## 2015-10-09 NOTE — Telephone Encounter (Signed)
Left msg on vm for pt to return my call. Dr. Marcelyn Ditty is not in network with [facility] and she does not have out of network benefits. Patient will need to cancel appointment and go to someone who is network or pay out of pocket to see Dr. Marcelyn Ditty. Awaiting return call from patient.

## 2015-10-09 NOTE — Telephone Encounter (Signed)
Spoke w/pt. She states she is aware Dr. Marcelyn Ditty is not in network, but she would like to continue to see him anyway.

## 2016-01-13 ENCOUNTER — Telehealth: Payer: Self-pay | Admitting: Internal Medicine

## 2016-01-13 NOTE — Telephone Encounter (Signed)
Please advise, can we do this 

## 2016-01-13 NOTE — Telephone Encounter (Signed)
Patient believes she has a UTI. She is requesting labs to be entered for her to go directly to lab.  Please advise.

## 2016-01-13 NOTE — Telephone Encounter (Signed)
Normally exam is preferred to determine what kind of urinary tract infection, review test results and determine best treatment given the choice of multiple antibiotics  I would suggest OV with G Calone, or me if able

## 2016-01-14 ENCOUNTER — Ambulatory Visit: Payer: Commercial Managed Care - HMO | Admitting: Nurse Practitioner

## 2016-01-14 DIAGNOSIS — Z0000 Encounter for general adult medical examination without abnormal findings: Secondary | ICD-10-CM | POA: Diagnosis not present

## 2016-01-14 DIAGNOSIS — L298 Other pruritus: Secondary | ICD-10-CM | POA: Diagnosis not present

## 2016-01-14 NOTE — Telephone Encounter (Signed)
appt made

## 2016-01-14 NOTE — Telephone Encounter (Signed)
Looks like appt cancelled per patient

## 2016-03-04 ENCOUNTER — Telehealth: Payer: Self-pay | Admitting: Internal Medicine

## 2016-03-04 NOTE — Telephone Encounter (Signed)
Refill x 1 # 12  Ask her  If she needs/wants to see me again i.e. Do I need to place more bands?

## 2016-03-04 NOTE — Telephone Encounter (Signed)
May I refill ? 

## 2016-03-05 MED ORDER — HYDROCORTISONE ACETATE 25 MG RE SUPP: 25.0000 mg | Freq: Two times a day (BID) | RECTAL | Status: DC

## 2016-03-05 NOTE — Telephone Encounter (Signed)
Spoke with Joan Townsend and we set her up appointment for 04/08/16 at 3pm for possible banding.

## 2016-04-08 ENCOUNTER — Encounter: Payer: Commercial Managed Care - HMO | Admitting: Internal Medicine

## 2016-04-15 ENCOUNTER — Encounter: Payer: Self-pay | Admitting: Internal Medicine

## 2016-04-15 ENCOUNTER — Ambulatory Visit (INDEPENDENT_AMBULATORY_CARE_PROVIDER_SITE_OTHER): Payer: PPO | Admitting: Internal Medicine

## 2016-04-15 VITALS — BP 132/72 | HR 79 | Resp 20 | Wt 173.0 lb

## 2016-04-15 DIAGNOSIS — I10 Essential (primary) hypertension: Secondary | ICD-10-CM | POA: Diagnosis not present

## 2016-04-15 DIAGNOSIS — L989 Disorder of the skin and subcutaneous tissue, unspecified: Secondary | ICD-10-CM

## 2016-04-15 DIAGNOSIS — M79604 Pain in right leg: Secondary | ICD-10-CM | POA: Insufficient documentation

## 2016-04-15 DIAGNOSIS — R6889 Other general symptoms and signs: Secondary | ICD-10-CM

## 2016-04-15 DIAGNOSIS — M25561 Pain in right knee: Secondary | ICD-10-CM | POA: Diagnosis not present

## 2016-04-15 DIAGNOSIS — Z0001 Encounter for general adult medical examination with abnormal findings: Secondary | ICD-10-CM

## 2016-04-15 NOTE — Progress Notes (Signed)
Subjective:    Patient ID: Joan Townsend, female    DOB: 1941-05-17, 75 y.o.   MRN: [id number]  HPI  Here with 2 wks onset Right knee pain and swelling, assoc soon thereafter of leg and foot swelling with increasing discomfort and persistent swelling so decided to come in today.  No hx of gout, recent trauma or fever.  No prior hx of same. No LLE symptoms.  Pt denies chest pain, increased sob or doe, wheezing, orthopnea, PND, palpitations, dizziness or syncope.  No knee giveaways or falls.  Pt denies new neurological symptoms such as new headache, or facial or extremity weakness or numbness   Pt denies polydipsia, polyuria.  Also co a new skin lesion to right lower mid jawline in the past 2 wks, flat but dark, new, ? Enlarging, no hx of skin ca. Past Medical History  Diagnosis Date  . Hemorrhoids   . Endometriosis   . History of scarlet fever   . Hypothyroidism   . Hepatitis     result of Scarlet fever  . HYPOTHYROIDISM 01/05/2011    Qualifier: Diagnosis of  By: Carlean Purl MD, Dimas Millin Acquired solitary kidney     s/p right nephrectomy  . Hyperlipidemia 06/29/2012   Past Surgical History  Procedure Laterality Date  . Appendectomy    . Abdominal hysterectomy    . Colon resection      part of endometriosis surgery  . Nephrectomy      right, due to birth deformity  . Colonoscopy  2006    hemorrhoids(Dr. Lajoyce Corners)  . Cholecystectomy  2012  . Hemorrhoid banding  2014    reports that she quit smoking about 37 years ago. Her smoking use included Cigarettes. She has never used smokeless tobacco. She reports that she drinks alcohol. She reports that she does not use illicit drugs. family history includes Heart disease in her maternal grandfather; Uterine cancer in her mother. Allergies  Allergen Reactions  . Ciprofloxacin     REACTION: rash  . Penicillins     REACTION: rash  . Sulfa Antibiotics     Hives   Current Outpatient Prescriptions on File Prior to Visit  Medication Sig  Dispense Refill  . Ascorbic Acid (VITAMIN C) 1000 MG tablet Take 1,000 mg by mouth daily.    . Cholecalciferol (VITAMIN D-3) 5000 UNITS TABS Take by mouth daily.    . Coenzyme Q10 (CO Q 10) 100 MG CAPS Take 1 capsule by mouth daily.    Marland Kitchen estradiol (ESTRACE) 1 MG tablet Take 1 mg by mouth daily.      . hydrocortisone (ANUSOL-HC) 25 MG suppository Place 1 suppository (25 mg total) rectally every 12 (twelve) hours. 12 suppository 0  . levothyroxine (SYNTHROID, LEVOTHROID) 125 MCG tablet Take 125 mcg by mouth daily before breakfast.    . Magnesium 100 MG CAPS Take by mouth.    [name] 150 MG CAPS Take by mouth.    Marland Kitchen POTASSIUM IODIDE, ANTIDOTE, PO Take by mouth daily.    . vitamin B-12 (CYANOCOBALAMIN) 1000 MCG tablet Take 1,000 mcg by mouth daily.     No current facility-administered medications on file prior to visit.   Review of Systems  Constitutional: Negative for unusual diaphoresis or night sweats HENT: Negative for ear swelling or discharge Eyes: Negative for worsening visual haziness  Respiratory: Negative for choking and stridor.   Gastrointestinal: Negative for distension or worsening eructation Genitourinary: Negative for retention or change in  urine volume.  Musculoskeletal: Negative for other MSK pain or swelling Skin: Negative for color change and worsening wound Neurological: Negative for tremors and numbness other than noted  Psychiatric/Behavioral: Negative for decreased concentration or agitation other than above       Objective:   Physical Exam BP 132/72 mmHg  Pulse 79  Resp 20  Wt 173 lb (78.472 kg)  SpO2 97% VS noted,  Constitutional: Pt appears in no apparent distress HENT: Head: NCAT.  Right Ear: External ear normal.  Left Ear: External ear normal.  Eyes: . Pupils are equal, round, and reactive to light. Conjunctivae and EOM are normal Neck: Normal range of motion. Neck supple.  Cardiovascular: Normal rate and regular rhythm.     Pulmonary/Chest: Effort normal and breath sounds without rales or wheezing.  Right knee with 1+ effusion, NT, decreased ROM, warm mild detected lateral joint line, + bony degenerative changes Neurological: Pt is alert. Not confused , motor grossly intact Skin: Skin is warm. No rash, trace -1+ RLE edema (none to left) to knee , also right low mid face mid jawline with 1/2 cm lesion tan/brown flat, nontender  Psychiatric: Pt behavior is normal. No agitation.   Lab Results  Component Value Date   WBC 7.3 08/08/2015   HGB 13.9 08/08/2015   HCT 42.1 08/08/2015   PLT 273.0 08/08/2015   GLUCOSE 84 08/08/2015   CHOL 208* 09/05/2014   TRIG 131.0 09/05/2014   HDL 61.00 09/05/2014   LDLDIRECT 121.5 06/29/2012   LDLCALC 121* 09/05/2014   ALT 11 09/05/2014   AST 13 09/05/2014   NA 142 08/08/2015   K 4.6 08/08/2015   CL 104 08/08/2015   CREATININE 1.08 08/08/2015   BUN 13 08/08/2015   CO2 31 08/08/2015   TSH 1.11 09/05/2014   Most recent cxr: RADIOLOGY REPORT*  Clinical Data: Chest pain  CHEST - 2 VIEW  Comparison: None  Findings: The heart size and mediastinal contours are within normal limits. Both lungs are clear. The visualized skeletal structures are unremarkable.  IMPRESSION: No active cardiopulmonary abnormalities.   Original Report Authenticated By: Angelita Ingles, M.D.     Assessment & Plan:

## 2016-04-15 NOTE — Patient Instructions (Addendum)
Please continue all other medications as before, and refills have been done if requested.  Please have the pharmacy call with any other refills you may need.  Please continue your efforts at being more active, low cholesterol diet, and weight control.  You are otherwise up to date with prevention measures today.  Please keep your appointments with your specialists as you may have planned  You will be contacted regarding the referral for: Dr Smith/sports medicine, as well as the Right Leg venous Doppler testing today  Please return in 4 weeks, or sooner if needed, with Lab testing done 3-5 days before

## 2016-04-15 NOTE — Progress Notes (Signed)
Pre visit review using our clinic review tool, if applicable. No additional management support is needed unless otherwise documented below in the visit note. 

## 2016-04-16 ENCOUNTER — Ambulatory Visit (HOSPITAL_COMMUNITY)
Admission: RE | Admit: 2016-04-16 | Discharge: 2016-04-16 | Disposition: A | Discharge status: Home / Self Care | Payer: PPO | Source: Ambulatory Visit | Attending: Cardiology | Admitting: Cardiology

## 2016-04-16 ENCOUNTER — Telehealth: Payer: Self-pay

## 2016-04-16 DIAGNOSIS — M79604 Pain in right leg: Secondary | ICD-10-CM | POA: Diagnosis not present

## 2016-04-16 DIAGNOSIS — M7989 Other specified soft tissue disorders: Secondary | ICD-10-CM | POA: Diagnosis not present

## 2016-04-16 DIAGNOSIS — E785 Hyperlipidemia, unspecified: Secondary | ICD-10-CM | POA: Insufficient documentation

## 2016-04-16 NOTE — Telephone Encounter (Signed)
The test for blood clot in the leg was negative  The swelling most likely is related to the knee instead.  Please make sure to f/u with Dr Smith/sports medicine (ok to to make appt at scheduling if does not have appt)

## 2016-04-16 NOTE — Telephone Encounter (Signed)
Patient aware of results.

## 2016-04-16 NOTE — Telephone Encounter (Signed)
Dorena Cookey from [facility] lab at [facility] called about patients venous duplex exam. Exam was negative

## 2016-04-18 NOTE — Assessment & Plan Note (Signed)
Most likely related to djd flare, for sport med referral,  Pain control, to f/u any worsening symptoms or concerns

## 2016-04-18 NOTE — Assessment & Plan Note (Signed)
stable overall by history and exam, recent data reviewed with pt, and pt to continue medical treatment as before,  to f/u any worsening symptoms or concerns BP Readings from Last 3 Encounters:  04/15/16 132/72  08/22/15 138/82  08/08/15 150/82

## 2016-04-18 NOTE — Assessment & Plan Note (Signed)
With swelling, mild, I think most likely related to the right knee effusion, but cant r/o DVT - for RLE venous doppler,  to f/u any worsening symptoms or concerns

## 2016-04-18 NOTE — Assessment & Plan Note (Signed)
Atypical for skin ca, more likely new facial skin lesion such as liver spot, but new, enlarging per pt, for derm referral

## 2016-04-23 ENCOUNTER — Encounter: Payer: Self-pay | Admitting: Family

## 2016-04-23 ENCOUNTER — Ambulatory Visit (INDEPENDENT_AMBULATORY_CARE_PROVIDER_SITE_OTHER): Payer: PPO | Admitting: Family

## 2016-04-23 VITALS — BP 150/88 | HR 77 | Temp 97.8°F | Resp 16 | Ht 65.0 in | Wt 175.0 lb

## 2016-04-23 DIAGNOSIS — M21611 Bunion of right foot: Secondary | ICD-10-CM | POA: Insufficient documentation

## 2016-04-23 NOTE — Patient Instructions (Addendum)
Thank you for choosing [facility].  Summary/Instructions:  Ice x 20 minutes every 2 hours as needed and after activity.  Wear a good supportive shoe.  Ibuprofen / Aleve as needed for discomfort.  They will call to schedule your appointment with podiatry.   If your symptoms worsen or fail to improve, please contact our office for further instruction, or in case of emergency go directly to the emergency room at the closest medical facility.

## 2016-04-23 NOTE — Progress Notes (Signed)
Subjective:    Patient ID: Joan Townsend, female    DOB: [birth year], 75 y.o.   MRN: [id number]  Chief Complaint  Patient presents with  . Foot Pain    swelling in ankle and knee on right side    HPI:  Joan Townsend is a 75 y.o. female who  has a past medical history of Hemorrhoids; Endometriosis; History of scarlet fever; Hypothyroidism; Hepatitis; HYPOTHYROIDISM (01/05/2011); Acquired solitary kidney; and Hyperlipidemia (06/29/2012). and presents today for an office visit.   This is a new problem. Associated symptom of swelling and pain located in her right foot has been going on for about 1 month. Described as swelling really bad when she stands on it for long periods of time. Pain is described as achy. Denies any trauma. Modifying factors include lemograss and ice which helps with the swelling, but did not do anything for the pain. Timing of the symptoms generally are worse at night. Previously noted with concern for DVT which was ruled out with LE doppler. Notes from previous office visit and LE dopplers were reviewed.    Allergies  Allergen Reactions  . Ciprofloxacin     REACTION: rash  . Penicillins     REACTION: rash  . Sulfa Antibiotics     Hives     Current Outpatient Prescriptions on File Prior to Visit  Medication Sig Dispense Refill  . Ascorbic Acid (VITAMIN C) 1000 MG tablet Take 1,000 mg by mouth daily.    . Cholecalciferol (VITAMIN D-3) 5000 UNITS TABS Take by mouth daily.    . Coenzyme Q10 (CO Q 10) 100 MG CAPS Take 1 capsule by mouth daily.    Marland Kitchen estradiol (ESTRACE) 1 MG tablet Take 1 mg by mouth daily.      . hydrocortisone (ANUSOL-HC) 25 MG suppository Place 1 suppository (25 mg total) rectally every 12 (twelve) hours. 12 suppository 0  . levothyroxine (SYNTHROID, LEVOTHROID) 125 MCG tablet Take 125 mcg by mouth daily before breakfast.    . Magnesium 100 MG CAPS Take by mouth.    [name] 150 MG CAPS Take by mouth.    Marland Kitchen POTASSIUM IODIDE, ANTIDOTE, PO  Take by mouth daily.    . vitamin B-12 (CYANOCOBALAMIN) 1000 MCG tablet Take 1,000 mcg by mouth daily.     No current facility-administered medications on file prior to visit.    Past Medical History  Diagnosis Date  . Hemorrhoids   . Endometriosis   . History of scarlet fever   . Hypothyroidism   . Hepatitis     result of Scarlet fever  . HYPOTHYROIDISM 01/05/2011    Qualifier: Diagnosis of  By: Carlean Purl MD, Dimas Millin Acquired solitary kidney     s/p right nephrectomy  . Hyperlipidemia 06/29/2012    Review of Systems  Constitutional: Negative for fever and chills.  Musculoskeletal:       Positive for right foot pain  Neurological: Negative for weakness and numbness.      Objective:    BP 150/88 mmHg  Pulse 77  Temp(Src) 97.8 F (36.6 C) (Oral)  Resp 16  Ht 5\' 5"  (1.651 m)  Wt 175 lb (79.379 kg)  BMI 29.12 kg/m2  SpO2 97% Nursing note and vital signs reviewed.  Physical Exam  Constitutional: She is oriented to person, place, and time. She appears well-developed and well-nourished. No distress.  Cardiovascular: Normal rate, regular rhythm, normal heart sounds and intact distal pulses.   Pulmonary/Chest:  Effort normal and breath sounds normal.  Musculoskeletal:  Right foot - bunion noted with redness. First toe moves laterally without support over second toe. There is tenderness elicited over the lateral aspect of the foot around the fifth metatarsal as well as dorsally with a small cyst being noted that is mobile and soft. Range of motion is within normal limits. Pulses and capillary refill are intact and appropriate. No lower extremity edema as previously noted. Metatarsal glide is negative and no evidence of neuroma.  Neurological: She is alert and oriented to person, place, and time.  Skin: Skin is warm and dry.  Psychiatric: She has a normal mood and affect. Her behavior is normal. Judgment and thought content normal.   Examination: Ultrasound of the  Foot/Ankle Date:  04/23/2016 Patient Name: Joan Townsend History:  Right foot pain with bunion and no evidence of trauma  Findings:  No evidence of ankle joint effusion. Anteriorly, the tibialis anterior, extensor hallucis longus, extensor digitorum longus are normal. Small ganglion appearing cyst above medial cuneiform.  Medially, the posterior tibialis, flexor digitorum longus, flexor hallucis longus, tibial nerve, and deltoid ligament are normal. Laterally, the peroneus brevis and longus are normal, as are the anterior talofibular, calcaneofibular, and anterior tibiofibular ligaments. Posteriorly, the Achilles tendon and plantar fascia are normal.   Impression: Small cyst most likely ganglion noted above cuneiform.      All images are located under the media tab. [place] ordered, performed and interpreted by Terri Piedra, FNP     Assessment & Plan:   Problem List Items Addressed This Visit      Musculoskeletal and Integument   Bunion of great toe of right foot - Primary    Bunion of right great toe presents most likely resulting in change of gait causing the lateral foot pain given normal exam. Ultrasound reveals cyst most likely ganglion on top of the middle cuneiform. Patient declines aspiration or cortisone injection. Treat conservatively with lemon grass per patient request and ice. Referral to podiatry placed for further assessment and possible bunionectomy. Follow-up if symptoms worsen or do not improve.      Relevant Orders   Ambulatory referral to Podiatry   [place] Extrem Low Right Ltd       I am having Ms. Rowlands maintain her estradiol, Co Q 10, (POTASSIUM IODIDE, ANTIDOTE, PO), vitamin C, vitamin B-12, Vitamin D-3, levothyroxine, Magnesium, Olive Leaf Extract, and hydrocortisone.   Follow-up: Return if symptoms worsen or fail to improve.  Mauricio Po, FNP

## 2016-04-23 NOTE — Assessment & Plan Note (Signed)
Bunion of right great toe presents most likely resulting in change of gait causing the lateral foot pain given normal exam. Ultrasound reveals cyst most likely ganglion on top of the middle cuneiform. Patient declines aspiration or cortisone injection. Treat conservatively with lemon grass per patient request and ice. Referral to podiatry placed for further assessment and possible bunionectomy. Follow-up if symptoms worsen or do not improve.

## 2016-04-23 NOTE — Progress Notes (Signed)
Pre visit review using our clinic review tool, if applicable. No additional management support is needed unless otherwise documented below in the visit note. 

## 2016-04-30 ENCOUNTER — Telehealth: Payer: Self-pay | Admitting: Emergency Medicine

## 2016-04-30 NOTE — Telephone Encounter (Signed)
Patient called to check on referral for podiatry. Please follow up and give her a call back. Thanks.

## 2016-05-04 ENCOUNTER — Encounter: Payer: Commercial Managed Care - HMO | Admitting: Internal Medicine

## 2016-05-12 ENCOUNTER — Ambulatory Visit: Payer: PPO | Admitting: Internal Medicine

## 2016-05-17 ENCOUNTER — Encounter: Payer: Self-pay | Admitting: Podiatry

## 2016-05-17 ENCOUNTER — Ambulatory Visit (INDEPENDENT_AMBULATORY_CARE_PROVIDER_SITE_OTHER): Payer: PPO | Admitting: Podiatry

## 2016-05-17 ENCOUNTER — Ambulatory Visit (INDEPENDENT_AMBULATORY_CARE_PROVIDER_SITE_OTHER): Payer: PPO

## 2016-05-17 VITALS — BP 154/70 | HR 68 | Resp 16 | Ht 66.0 in | Wt 169.0 lb

## 2016-05-17 DIAGNOSIS — M79672 Pain in left foot: Secondary | ICD-10-CM

## 2016-05-17 DIAGNOSIS — M79671 Pain in right foot: Secondary | ICD-10-CM

## 2016-05-17 DIAGNOSIS — M779 Enthesopathy, unspecified: Secondary | ICD-10-CM | POA: Diagnosis not present

## 2016-05-17 DIAGNOSIS — M21619 Bunion of unspecified foot: Secondary | ICD-10-CM | POA: Diagnosis not present

## 2016-05-17 MED ORDER — TRIAMCINOLONE ACETONIDE 10 MG/ML IJ SUSP
10.0000 mg | Freq: Once | INTRAMUSCULAR | Status: AC
  Administered 2016-05-17: 10 mg

## 2016-05-19 NOTE — Progress Notes (Signed)
Subjective:     Patient ID: Joan Townsend, female   DOB: [birth year], 75 y.o.   MRN: [id number]  HPI patient presents stating I have inflammation my left foot and also structural bunion that I would like to get fixed at one point in the future. States it's been there for a long time and that she's tried wider shoes and other modalities   Review of Systems  All other systems reviewed and are negative.      Objective:   Physical Exam  Constitutional: She is oriented to person, place, and time.  Cardiovascular: Intact distal pulses.   Musculoskeletal: Normal range of motion.  Neurological: She is oriented to person, place, and time.  Skin: Skin is warm and dry.  Nursing note and vitals reviewed.  neurovascular status intact muscle strength adequate range of motion within normal limits with patient found to have inflammation around the left first MPJ with pain and redness and also tendinitis of the left foot with fluid buildup noted good digital perfusion and is well oriented 3     Assessment:     Inflammatory capsulitis with structural bunion deformity left it's painful when pressed with tendinitis-like symptoms    Plan:     H&P x-rays reviewed and today careful tenderness injection administered and discussed with patient possibility for structural bunion correction. Patient wants to pursue this present at the end of the month to discuss in greater detail  X-ray report indicates structural abnormality with deviation and elevation of the intermetatarsal angle

## 2016-05-20 ENCOUNTER — Telehealth: Payer: Self-pay | Admitting: Emergency Medicine

## 2016-05-20 NOTE — Telephone Encounter (Signed)
Pt called and her gyn usually fills her prescriptions. He is out of medical leave and might not be coming back. She wanted to know if her prescription levothyroxine (SYNTHROID, LEVOTHROID) 125 MCG tablet can be refilled. [facility] [address]. Please follow up thanks.

## 2016-05-21 MED ORDER — LEVOTHYROXINE SODIUM 125 MCG PO TABS: 125.0000 ug | ORAL_TABLET | Freq: Every day | ORAL | 0 refills | Status: DC

## 2016-05-21 NOTE — Telephone Encounter (Signed)
rx done for 90 days, no refill  I am hoping this is enough to last to her yearly appt

## 2016-05-24 ENCOUNTER — Telehealth: Payer: Self-pay | Admitting: Family

## 2016-05-24 ENCOUNTER — Encounter: Payer: Self-pay | Admitting: Podiatry

## 2016-05-24 ENCOUNTER — Ambulatory Visit (INDEPENDENT_AMBULATORY_CARE_PROVIDER_SITE_OTHER): Payer: PPO | Admitting: Podiatry

## 2016-05-24 DIAGNOSIS — S93402S Sprain of unspecified ligament of left ankle, sequela: Secondary | ICD-10-CM | POA: Diagnosis not present

## 2016-05-24 DIAGNOSIS — M779 Enthesopathy, unspecified: Secondary | ICD-10-CM | POA: Diagnosis not present

## 2016-05-24 NOTE — Telephone Encounter (Signed)
Pt want to speak to the assistant concern about her foot. She went to see [facility] and they did not do anything for her, she is frustrated with their service and do not want to go back. Please advise, she need to see someone who can tell her what is going on with her foot. Please call her back

## 2016-05-25 NOTE — Telephone Encounter (Signed)
It appears they may have performed an injection per the notes. Is that not accurate?

## 2016-05-25 NOTE — Progress Notes (Signed)
Subjective:     Patient ID: Joan Townsend, female   DOB: 04-23-1941, 75 y.o.   MRN: [id number]  HPI patient states I'm still having pain in my right foot but it seems to be somewhat improved   Review of Systems     Objective:   Physical Exam Neurovascular status intact negative Homans sign noted with normal inversion eversion of the right foot with mild discomfort within the sinus tarsi that is reduced from previous visit    Assessment:     Improved sinus tarsitis right    Plan:     Advised on continued physical therapy supportive shoe gear usage and I will see this patient back again is needed and we discussed possibility long-term for Tri-Lock ankle brace

## 2016-05-26 NOTE — Telephone Encounter (Signed)
appt scheduled

## 2016-05-26 NOTE — Telephone Encounter (Signed)
Pt is requesting to see Dr. Tamala Julian. Was originally supposed to see him a while back but schedule was full. Was sent to [facility] and was not happy with the experience or the Drs and would prefer not to go back there. Do you think Dr. Tamala Julian could see this pt. Xrays from foot center are in her chart.

## 2016-05-31 NOTE — Telephone Encounter (Signed)
o

## 2016-06-02 NOTE — Telephone Encounter (Signed)
Pt called back to follow up about this, pt was hopping Dr. Tamala Julian can work her in before 06/10/16 b/c she wants to get this look at before she has procedure with GI doctor. Please call her back  # [phone]

## 2016-06-02 NOTE — Telephone Encounter (Signed)
Spoke with pt, scheduled her for 06/07/16 @ 330pm.

## 2016-06-06 NOTE — Progress Notes (Deleted)
[facility] [address] [place], [place] [address] Phone: [phone] Subjective:    I'm seeing this patient by the request  of:  Cathlean Cower, MD   CC: Right foot pain   [id number]  Joan Townsend is a 75 y.o. female coming in with complaint of Right foot pain.  Has seen podiatry. Fif not have a great interaction. Patient states      Past Medical History:  Diagnosis Date  . Acquired solitary kidney    s/p right nephrectomy  . Endometriosis   . Hemorrhoids   . Hepatitis    result of Scarlet fever  . History of scarlet fever   . Hyperlipidemia 06/29/2012  . Hypothyroidism   . HYPOTHYROIDISM 01/05/2011   Qualifier: Diagnosis of  By: Carlean Purl MD, Dimas Millin    Past Surgical History:  Procedure Laterality Date  . ABDOMINAL HYSTERECTOMY    . APPENDECTOMY    . CHOLECYSTECTOMY  2012  . COLON RESECTION     part of endometriosis surgery  . COLONOSCOPY  2006   hemorrhoids(Dr. Lajoyce Corners)  . HEMORRHOID BANDING  2014  . NEPHRECTOMY     right, due to birth deformity   Social History   Social History  . Marital status: Widowed    Spouse name: N/A  . Number of children: N/A  . Years of education: N/A   Occupational History  . retired Retired   Social History Main Topics  . Smoking status: Former Smoker    Types: Cigarettes    Quit date: 10/25/1978  . Smokeless tobacco: Never Used  . Alcohol use Yes     Comment: occasional   . Drug use: No  . Sexual activity: Not on file   Other Topics Concern  . Not on file   Social History Narrative  . No narrative on file   Allergies  Allergen Reactions  . Ciprofloxacin     REACTION: rash  . Penicillins     REACTION: rash  . Sulfa Antibiotics     Hives   Family History  Problem Relation Age of Onset  . Breast cancer      great aunts x2, cousin  . Ovarian cancer      aunt  . Uterine cancer Mother   . Colon cancer      great aunts x3  . Heart disease Maternal Grandfather     Past medical  history, social, surgical and family history all reviewed in electronic medical record.  No pertanent information unless stated regarding to the chief complaint.   Review of Systems: No headache, visual changes, nausea, vomiting, diarrhea, constipation, dizziness, abdominal pain, skin rash, fevers, chills, night sweats, weight loss, swollen lymph nodes, body aches, joint swelling, muscle aches, chest pain, shortness of breath, mood changes.   Objective  There were no vitals taken for this visit.  General: No apparent distress alert and oriented x3 mood and affect normal, dressed appropriately.  HEENT: Pupils equal, extraocular movements intact  Respiratory: Patient's speak in full sentences and does not appear short of breath  Cardiovascular: No lower extremity edema, non tender, no erythema  Skin: Warm dry intact with no signs of infection or rash on extremities or on axial skeleton.  Abdomen: Soft nontender  Neuro: Cranial nerves II through XII are intact, neurovascularly intact in all extremities with 2+ DTRs and 2+ pulses.  Lymph: No lymphadenopathy of posterior or anterior cervical chain or axillae bilaterally.  Gait normal with good balance and  coordination.  MSK:  Non tender with full range of motion and good stability and symmetric strength and tone of shoulders, elbows, wrist, hip, knee and ankles bilaterally.  Foot exam shows    Impression and Recommendations:     This case required medical decision making of moderate complexity.      Note: This dictation was prepared with Dragon dictation along with smaller phrase technology. Any transcriptional errors that result from this process are unintentional.

## 2016-06-07 ENCOUNTER — Ambulatory Visit: Payer: PPO | Admitting: Family Medicine

## 2016-06-08 ENCOUNTER — Other Ambulatory Visit (INDEPENDENT_AMBULATORY_CARE_PROVIDER_SITE_OTHER): Payer: PPO

## 2016-06-08 DIAGNOSIS — Z0001 Encounter for general adult medical examination with abnormal findings: Secondary | ICD-10-CM | POA: Diagnosis not present

## 2016-06-08 DIAGNOSIS — R6889 Other general symptoms and signs: Secondary | ICD-10-CM | POA: Diagnosis not present

## 2016-06-08 LAB — CBC WITH DIFFERENTIAL/PLATELET
BASOS ABS: 0 10*3/uL (ref 0.0–0.1)
Basophils Relative: 0.4 % (ref 0.0–3.0)
EOS PCT: 1.5 % (ref 0.0–5.0)
Eosinophils Absolute: 0.1 10*3/uL (ref 0.0–0.7)
HEMATOCRIT: 40 % (ref 36.0–46.0)
Hemoglobin: 13.6 g/dL (ref 12.0–15.0)
LYMPHS ABS: 1.5 10*3/uL (ref 0.7–4.0)
LYMPHS PCT: 22 % (ref 12.0–46.0)
MCHC: 34 g/dL (ref 30.0–36.0)
MCV: 85.3 fl (ref 78.0–100.0)
MONOS PCT: 8.3 % (ref 3.0–12.0)
Monocytes Absolute: 0.6 10*3/uL (ref 0.1–1.0)
NEUTROS ABS: 4.5 10*3/uL (ref 1.4–7.7)
NEUTROS PCT: 67.8 % (ref 43.0–77.0)
Platelets: 247 10*3/uL (ref 150.0–400.0)
RBC: 4.69 Mil/uL (ref 3.87–5.11)
RDW: 15.3 % (ref 11.5–15.5)
WBC: 6.6 10*3/uL (ref 4.0–10.5)

## 2016-06-08 LAB — URINALYSIS, ROUTINE W REFLEX MICROSCOPIC
BILIRUBIN URINE: NEGATIVE
KETONES UR: NEGATIVE
Leukocytes, UA: NEGATIVE
Nitrite: NEGATIVE
Specific Gravity, Urine: <=1.005 — AB (ref 1.000–1.030)
TOTAL PROTEIN, URINE-UPE24: NEGATIVE
Urine Glucose: NEGATIVE
Urobilinogen, UA: 0.2 (ref 0.0–1.0)
pH: 7 (ref 5.0–8.0)

## 2016-06-08 LAB — BASIC METABOLIC PANEL
BUN: 13 mg/dL (ref 6–23)
CALCIUM: 9.5 mg/dL (ref 8.4–10.5)
CO2: 29 meq/L (ref 19–32)
CREATININE: 1.09 mg/dL (ref 0.40–1.20)
Chloride: 105 mEq/L (ref 96–112)
GFR: 51.66 mL/min — AB (ref >60.00)
Glucose, Bld: 92 mg/dL (ref 70–99)
POTASSIUM: 4.3 meq/L (ref 3.5–5.1)
SODIUM: 141 meq/L (ref 135–145)

## 2016-06-08 LAB — LIPID PANEL
CHOLESTEROL: 195 mg/dL (ref 0–200)
HDL: 85.5 mg/dL (ref >39.00)
LDL Cholesterol: 94 mg/dL (ref 0–99)
NonHDL: 109.73
Total CHOL/HDL Ratio: 2
Triglycerides: 80 mg/dL (ref 0.0–149.0)
VLDL: 16 mg/dL (ref 0.0–40.0)

## 2016-06-08 LAB — HEPATIC FUNCTION PANEL
ALBUMIN: 4.1 g/dL (ref 3.5–5.2)
ALK PHOS: 68 U/L (ref 39–117)
ALT: 12 U/L (ref 0–35)
AST: 17 U/L (ref 0–37)
BILIRUBIN DIRECT: 0.1 mg/dL (ref 0.0–0.3)
TOTAL PROTEIN: 7.1 g/dL (ref 6.0–8.3)
Total Bilirubin: 0.4 mg/dL (ref 0.2–1.2)

## 2016-06-08 LAB — TSH: TSH: 1.69 u[IU]/mL (ref 0.35–4.50)

## 2016-06-10 ENCOUNTER — Encounter: Payer: Self-pay | Admitting: Internal Medicine

## 2016-06-10 ENCOUNTER — Ambulatory Visit (INDEPENDENT_AMBULATORY_CARE_PROVIDER_SITE_OTHER): Payer: PPO | Admitting: Internal Medicine

## 2016-06-10 VITALS — BP 128/80 | HR 74 | Ht 64.0 in | Wt 174.4 lb

## 2016-06-10 VITALS — BP 126/78 | HR 75 | Temp 98.4°F | Resp 20 | Wt 176.0 lb

## 2016-06-10 DIAGNOSIS — E039 Hypothyroidism, unspecified: Secondary | ICD-10-CM

## 2016-06-10 DIAGNOSIS — Z0001 Encounter for general adult medical examination with abnormal findings: Secondary | ICD-10-CM | POA: Diagnosis not present

## 2016-06-10 DIAGNOSIS — I10 Essential (primary) hypertension: Secondary | ICD-10-CM

## 2016-06-10 DIAGNOSIS — K644 Residual hemorrhoidal skin tags: Secondary | ICD-10-CM

## 2016-06-10 DIAGNOSIS — K648 Other hemorrhoids: Secondary | ICD-10-CM

## 2016-06-10 DIAGNOSIS — R6889 Other general symptoms and signs: Secondary | ICD-10-CM | POA: Diagnosis not present

## 2016-06-10 DIAGNOSIS — J309 Allergic rhinitis, unspecified: Secondary | ICD-10-CM | POA: Diagnosis not present

## 2016-06-10 DIAGNOSIS — I83819 Varicose veins of unspecified lower extremities with pain: Secondary | ICD-10-CM | POA: Diagnosis not present

## 2016-06-10 MED ORDER — HYDROCORTISONE 2.5 % RE CREA: 1.0000 "application " | TOPICAL_CREAM | Freq: Two times a day (BID) | RECTAL | 2 refills | Status: DC | PRN

## 2016-06-10 NOTE — Progress Notes (Signed)
Pre visit review using our clinic review tool, if applicable. No additional management support is needed unless otherwise documented below in the visit note. 

## 2016-06-10 NOTE — Patient Instructions (Signed)
   We have sent the following medications to your pharmacy for you to pick up at your convenience: Hydrocortisone 2.5% cream    I appreciate the opportunity to care for you. Silvano Rusk, MD, [facility]

## 2016-06-10 NOTE — Progress Notes (Signed)
Subjective:    Patient ID: Joan Townsend, female    DOB: 1941-03-06, 75 y.o.   MRN: [id number]  HPI  Here for wellness and f/u;  Overall doing ok;  Pt denies Chest pain, worsening SOB, DOE, wheezing, orthopnea, PND, worsening LE edema, palpitations, dizziness or syncope.  Pt denies neurological change such as new headache, facial or extremity weakness.  Pt denies polydipsia, polyuria, or low sugar symptoms. Pt states overall good compliance with treatment and medications, good tolerability, and has been trying to follow appropriate diet.  Pt denies worsening depressive symptoms, suicidal ideation or panic. No fever, night sweats, wt loss, loss of appetite, or other constitutional symptoms.  Pt states good ability with ADL's, has low fall risk, home safety reviewed and adequate, no other significant changes in hearing or vision, and only occasionally active with exercise. Right knee improved, to see Dr Tamala Julian next Monday Wt Readings from Last 3 Encounters:  06/10/16 176 lb (79.8 kg)  05/17/16 169 lb (76.7 kg)  04/23/16 175 lb (79.4 kg)   Does have several wks ongoing nasal allergy symptoms with clearish congestion, itch and sneezing, without fever, pain, ST, cough, swelling or wheezing.  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 Past Medical History:  Diagnosis Date  . Acquired solitary kidney    s/p right nephrectomy  . Endometriosis   . Hemorrhoids   . Hepatitis    result of Scarlet fever  . History of scarlet fever   . Hyperlipidemia 06/29/2012  . Hypothyroidism   . HYPOTHYROIDISM 01/05/2011   Qualifier: Diagnosis of  By: Carlean Purl MD, Dimas Millin    Past Surgical History:  Procedure Laterality Date  . ABDOMINAL HYSTERECTOMY    . APPENDECTOMY    . CHOLECYSTECTOMY  2012  . COLON RESECTION     part of endometriosis surgery  . COLONOSCOPY  2006   hemorrhoids(Dr. Lajoyce Corners)  . HEMORRHOID  BANDING  2014  . NEPHRECTOMY     right, due to birth deformity    reports that she quit smoking about 37 years ago. Her smoking use included Cigarettes. She has never used smokeless tobacco. She reports that she drinks alcohol. She reports that she does not use drugs. family history includes Heart disease in her maternal grandfather; Uterine cancer in her mother. Allergies  Allergen Reactions  . Ciprofloxacin     REACTION: rash  . Penicillins     REACTION: rash  . Sulfa Antibiotics     Hives   Current Outpatient Prescriptions on File Prior to Visit  Medication Sig Dispense Refill  . Ascorbic Acid (VITAMIN C) 1000 MG tablet Take 1,000 mg by mouth daily.    . Cholecalciferol (VITAMIN D-3) 5000 UNITS TABS Take by mouth daily.    . Coenzyme Q10 (CO Q 10) 100 MG CAPS Take 1 capsule by mouth daily.    Marland Kitchen estradiol (ESTRACE) 1 MG tablet Take 1 mg by mouth daily.      . hydrocortisone (ANUSOL-HC) 25 MG suppository Place 1 suppository (25 mg total) rectally every 12 (twelve) hours. 12 suppository 0  . levothyroxine (SYNTHROID, LEVOTHROID) 125 MCG tablet Take 1 tablet (125 mcg total) by mouth daily before breakfast. 90 tablet 0  . Magnesium 100 MG CAPS Take by mouth.    [name] 150 MG CAPS Take by mouth.    Marland Kitchen POTASSIUM IODIDE, ANTIDOTE, PO Take by mouth daily.    Marland Kitchen  vitamin B-12 (CYANOCOBALAMIN) 1000 MCG tablet Take 1,000 mcg by mouth daily.    . hydrocortisone (ANUSOL-HC) 2.5 % rectal cream Place 1 application rectally 2 (two) times daily as needed for hemorrhoids or itching. 30 g 2  . TURMERIC PO Take 750 mg by mouth daily.      No current facility-administered medications on file prior to visit.    Review of Systems Constitutional: Negative for increased diaphoresis, or other activity, appetite or siginficant weight change other than noted HENT: Negative for worsening hearing loss, ear pain, facial swelling, mouth sores and neck stiffness.   Eyes: Negative for other worsening  pain, redness or visual disturbance.  Respiratory: Negative for choking or stridor Cardiovascular: Negative for other chest pain and palpitations.  Gastrointestinal: Negative for worsening diarrhea, blood in stool, or abdominal distention Genitourinary: Negative for hematuria, flank pain or change in urine volume.  Musculoskeletal: Negative for myalgias or other joint complaints.  Skin: Negative for other color change and wound or drainage.  Neurological: Negative for syncope and numbness. other than noted Hematological: Negative for adenopathy. or other swelling Psychiatric/Behavioral: Negative for hallucinations, SI, self-injury, decreased concentration or other worsening agitation.      Objective:   Physical Exam BP 126/78   Pulse 75   Temp 98.4 F (36.9 C) (Oral)   Resp 20   Wt 176 lb (79.8 kg)   SpO2 96%   BMI 28.41 kg/m  VS noted,  Constitutional: Pt is oriented to person, place, and time. Appears well-developed and well-nourished, in no significant distress Head: Normocephalic and atraumatic  Eyes: Conjunctivae and EOM are normal. Pupils are equal, round, and reactive to light Right Ear: External ear normal.  Left Ear: External ear normal Nose: Nose normal.  Mouth/Throat: Oropharynx is clear and moist  Bilat tm's with mild erythema.  Max sinus areas non tender.  Pharynx with mild erythema, no exudate Neck: Normal range of motion. Neck supple. No JVD present. No tracheal deviation present or significant neck LA or mass Cardiovascular: Normal rate, regular rhythm, normal heart sounds and intact distal pulses.   Pulmonary/Chest: Effort normal and breath sounds without rales or wheezing  Abdominal: Soft. Bowel sounds are normal. NT. No HSM  Musculoskeletal: Normal range of motion. Exhibits no edema Lymphadenopathy: Has no cervical adenopathy.  Neurological: Pt is alert and oriented to person, place, and time. Pt has normal reflexes. No cranial nerve deficit. Motor grossly  intact Skin: Skin is warm and dry. No rash noted or new ulcers Psychiatric:  Has normal mood and affect. Behavior is normal.  Multiple varicosities noted to LLE    Assessment & Plan:

## 2016-06-10 NOTE — Patient Instructions (Addendum)
Ok to try the OTC zyrtec and Nasacort for allergies  Please continue all other medications as before, and refills have been done if requested.  Please have the pharmacy call with any other refills you may need.  Please continue your efforts at being more active, low cholesterol diet, and weight control.  You are otherwise up to date with prevention measures today.  Please keep your appointments with your specialists as you may have planned - Dr Gaspar Bidding will be contacted regarding the referral for: [facility]  Please return in 1 year for your yearly visit, or sooner if needed, with Lab testing done 3-5 days before

## 2016-06-10 NOTE — Progress Notes (Signed)
   Subjective:    Patient ID: Joan Townsend, female    DOB: 12/06/1940, 75 y.o.   MRN: [id number]  cc: painful hemorrhoids HPI Has had some diarrhea lately - sore anal and rectal area.  Af few months ago had similar sxs w/ transient slight red blood w/ wiping. Used old HC suppositories with relief.  diartrhe has resolved.  S/p hemorrhoid banding LLx 2, RP x 2 and RA x 1 2014 and 2016.  Medications, allergies, past medical history, past surgical history, family history and social history are reviewed and updated in the EMR.  Review of Systems As above    Objective:   Physical Exam  BP 128/80   Pulse 74   Ht 5\' 4"  (1.626 m)   Wt 174 lb 6 oz (79.1 kg)   BMI 29.93 kg/m  Jackolyn Confer CMA present  Rectal - fleshy tags seen - nontende Mildly tender on DRE - anal canal, no fissure palpable  Anoscopy inflamed extrenal hemorrhoids all positions - Gr 1 internal hemorrhoids      Assessment & Plan:   Encounter Diagnosis  Name Primary?  . Bleeding external hemorrhoids Yes   Banding not indicated for external hemorrhoids. - treat w/ prn HC cream. See me prn

## 2016-06-12 NOTE — Assessment & Plan Note (Addendum)
Mild to mod, for zyrtec/nasacort asd,  to f/u any worsening symptoms or concerns  In addition to the time spent performing CPE, I spent an additional 25 minutes face to face,in which greater than 50% of this time was spent in counseling and coordination of care for patient's acute illness as documented.

## 2016-06-12 NOTE — Assessment & Plan Note (Signed)
stable overall by history and exam, recent data reviewed with pt, and pt to continue medical treatment as before,  to f/u any worsening symptoms or concerns BP Readings from Last 3 Encounters:  06/10/16 128/80  06/10/16 126/78  05/17/16 (!) 154/70

## 2016-06-12 NOTE — Assessment & Plan Note (Signed)
Mild to mod, for referral vein clinic,  to f/u any worsening symptoms or concerns

## 2016-06-12 NOTE — Assessment & Plan Note (Signed)
stable overall by history and exam, recent data reviewed with pt, and pt to continue medical treatment as before,  to f/u any worsening symptoms or concerns Lab Results  Component Value Date   TSH 1.69 06/08/2016

## 2016-06-12 NOTE — Assessment & Plan Note (Signed)

## 2016-06-16 ENCOUNTER — Encounter: Payer: Self-pay | Admitting: Internal Medicine

## 2016-06-16 DIAGNOSIS — D235 Other benign neoplasm of skin of trunk: Secondary | ICD-10-CM | POA: Diagnosis not present

## 2016-06-16 DIAGNOSIS — D2339 Other benign neoplasm of skin of other parts of face: Secondary | ICD-10-CM | POA: Diagnosis not present

## 2016-06-16 DIAGNOSIS — D1801 Hemangioma of skin and subcutaneous tissue: Secondary | ICD-10-CM | POA: Diagnosis not present

## 2016-06-16 DIAGNOSIS — L814 Other melanin hyperpigmentation: Secondary | ICD-10-CM | POA: Diagnosis not present

## 2016-06-19 NOTE — Progress Notes (Signed)
[facility] [address] [place], [place] [address] Phone: [phone] Subjective:    I'm seeing this patient by the request  of:  Cathlean Cower, MD   CC: Right foot pain,  right knee pain   [id number]  Joan Townsend is a 75 y.o. female coming in with complaint of Right foot pain.  Has seen podiatry. Did not have a great interaction. Patient states been years, had injection a year ago and helped. Patient states though the pain is worsening. Seems to be the first toe, top of the foot, and somewhat of the ankle. Patient states that sometimes seems to be worsening. Likes to walk and has been walking less significant pain. No swelling recently. No redness. No true injury.     patient is also complaining of right knee pain. Did fall approximately a year ago. Fell directly on the anterior aspect of the knee. Still uncomfortable but hat e the look more.  Still able to do ADL No radiation, no numbness, no swelling but the front of the knee.    Past Medical History:  Diagnosis Date  . Acquired solitary kidney    s/p right nephrectomy  . Endometriosis   . Hemorrhoids   . Hepatitis    result of Scarlet fever  . History of scarlet fever   . Hyperlipidemia 06/29/2012  . Hypothyroidism   . HYPOTHYROIDISM 01/05/2011   Qualifier: Diagnosis of  By: Carlean Purl MD, Dimas Millin    Past Surgical History:  Procedure Laterality Date  . ABDOMINAL HYSTERECTOMY    . APPENDECTOMY    . CHOLECYSTECTOMY  2012  . COLON RESECTION     part of endometriosis surgery  . COLONOSCOPY  2006   hemorrhoids(Dr. Lajoyce Corners)  . HEMORRHOID BANDING  2014  . NEPHRECTOMY     right, due to birth deformity   Social History   Social History  . Marital status: Widowed    Spouse name: N/A  . Number of children: N/A  . Years of education: N/A   Occupational History  . retired Retired   Social History Main Topics  . Smoking status: Former Smoker    Types: Cigarettes    Quit date: 10/25/1978  .  Smokeless tobacco: Never Used  . Alcohol use Yes     Comment: occasional   . Drug use: No  . Sexual activity: Not Asked   Other Topics Concern  . None   Social History Narrative  . None   Allergies  Allergen Reactions  . Ciprofloxacin     REACTION: rash  . Penicillins     REACTION: rash  . Sulfa Antibiotics     Hives   Family History  Problem Relation Age of Onset  . Breast cancer      great aunts x2, cousin  . Ovarian cancer      aunt  . Uterine cancer Mother   . Colon cancer      great aunts x3  . Heart disease Maternal Grandfather     Past medical history, social, surgical and family history all reviewed in electronic medical record.  No pertanent information unless stated regarding to the chief complaint.   Review of Systems: No headache, visual changes, nausea, vomiting, diarrhea, constipation, dizziness, abdominal pain, skin rash, fevers, chills, night sweats, weight loss, swollen lymph nodes, body aches, joint swelling, muscle aches, chest pain, shortness of breath, mood changes.   Objective  Blood pressure 134/68, pulse 83, weight 176 lb (79.8 kg),  SpO2 98 %.  General: No apparent distress alert and oriented x3 mood and affect normal, dressed appropriately.  HEENT: Pupils equal, extraocular movements intact  Respiratory: Patient's speak in full sentences and does not appear short of breath  Cardiovascular: No lower extremity edema, non tender, no erythema  Skin: Warm dry intact with no signs of infection or rash on extremities or on axial skeleton.  Abdomen: Soft nontender  Neuro: Cranial nerves II through XII are intact, neurovascularly intact in all extremities with 2+ DTRs and 2+ pulses.  Lymph: No lymphadenopathy of posterior or anterior cervical chain or axillae bilaterally.  Gait normal with good balance and coordination.  MSK:  Non tender with full range of motion and good stability and symmetric strength and tone of shoulders, elbows, wrist, hip and  ankles bilaterally.  Foot exam shows  Pes cavus./  First toe on right foot has subluxation, severe breakdown of the transverse arch. Tender to palpation over the midfoot. Bunionette formation.    Knee: right  Normal to inspection with no erythema or effusion or obvious bony abnormalities. Mild lipoma noted on the anterior lateral aspect of the knee. Palpation normal with no warmth, joint line tenderness, patellar tenderness, or condyle tenderness. ROM full in flexion and extension and lower leg rotation. Ligaments with solid consistent endpoints including ACL, PCL, LCL, MCL. Negative Mcmurray's, Apley's, and Thessalonian tests. Non painful patellar compression. Patellar glide without crepitus. Patellar and quadriceps tendons unremarkable. Hamstring and quadriceps strength is normal.  Contralateral knee unremarkable   MSK US performed of: right knee.  This study was ordered, performed, and interpreted by Charlann Boxer D.O.  Knee: All structures visualized. Patient's anterior lateral meniscus does have a cyst noted. No abnormality of prepatellar bursa. LCL and MCL unremarkable on long and transverse views. No abnormality of origin of medial or lateral head of the gastrocnemius.  IMPRESSION:  Anterior lateral meniscal cyst    Impression and Recommendations:     This case required medical decision making of moderate complexity.      Note: This dictation was prepared with Dragon dictation along with smaller phrase technology. Any transcriptional errors that result from this process are unintentional.

## 2016-06-21 ENCOUNTER — Other Ambulatory Visit: Payer: Self-pay

## 2016-06-21 ENCOUNTER — Encounter: Payer: Self-pay | Admitting: Family Medicine

## 2016-06-21 ENCOUNTER — Ambulatory Visit (INDEPENDENT_AMBULATORY_CARE_PROVIDER_SITE_OTHER): Payer: PPO | Admitting: Family Medicine

## 2016-06-21 VITALS — BP 134/68 | HR 83 | Wt 176.0 lb

## 2016-06-21 DIAGNOSIS — M129 Arthropathy, unspecified: Secondary | ICD-10-CM

## 2016-06-21 DIAGNOSIS — M23009 Cystic meniscus, unspecified meniscus, unspecified knee: Secondary | ICD-10-CM | POA: Insufficient documentation

## 2016-06-21 DIAGNOSIS — M21611 Bunion of right foot: Secondary | ICD-10-CM | POA: Diagnosis not present

## 2016-06-21 DIAGNOSIS — M79671 Pain in right foot: Secondary | ICD-10-CM

## 2016-06-21 DIAGNOSIS — M23003 Cystic meniscus, unspecified medial meniscus, right knee: Secondary | ICD-10-CM

## 2016-06-21 DIAGNOSIS — M216X1 Other acquired deformities of right foot: Secondary | ICD-10-CM | POA: Diagnosis not present

## 2016-06-21 DIAGNOSIS — M23000 Cystic meniscus, unspecified lateral meniscus, right knee: Secondary | ICD-10-CM

## 2016-06-21 DIAGNOSIS — M19079 Primary osteoarthritis, unspecified ankle and foot: Secondary | ICD-10-CM | POA: Insufficient documentation

## 2016-06-21 DIAGNOSIS — M23006 Cystic meniscus, unspecified meniscus, right knee: Secondary | ICD-10-CM

## 2016-06-21 NOTE — Assessment & Plan Note (Signed)
Discussed OTC orthotic.

## 2016-06-21 NOTE — Assessment & Plan Note (Signed)
Patient does have apparent meniscal cyst. No significant pain today. Mild pain with rotation. Encourage her to avoid certain activities. We'll try over-the-counter medications. We discussed icing regimen. Follow-up again in 4-6 weeks. If worsening symptoms consider injection aspiration.

## 2016-06-21 NOTE — Patient Instructions (Addendum)
Good to see you.   Ice bath 10 minutes at night.  Good shoes with rigid bottom.  Joan Townsend, Merrell or New balance greater then 700 Other shoes that may be better  allegria and xelero.  Spenco orthotics "total support" online would be great  Arnica lotion for pain sometimes to the top of the foot.  Avoid being barefoot.  Vitamin D 2000 IU daily  Turmeric 500mg  twice daily  Tart cherry extract any dose at night See me again in 4-6 weeks

## 2016-06-21 NOTE — Assessment & Plan Note (Signed)
Discussed metatarsal pad.

## 2016-06-21 NOTE — Assessment & Plan Note (Signed)
Discussed spacer, spenco orthotics, icing, avoid being barefoot.  RTC in 4-6 weeks.

## 2016-07-22 DIAGNOSIS — H26493 Other secondary cataract, bilateral: Secondary | ICD-10-CM | POA: Diagnosis not present

## 2016-07-22 DIAGNOSIS — H16103 Unspecified superficial keratitis, bilateral: Secondary | ICD-10-CM | POA: Diagnosis not present

## 2016-07-22 DIAGNOSIS — H524 Presbyopia: Secondary | ICD-10-CM | POA: Diagnosis not present

## 2016-07-22 DIAGNOSIS — H04123 Dry eye syndrome of bilateral lacrimal glands: Secondary | ICD-10-CM | POA: Diagnosis not present

## 2016-07-25 NOTE — Progress Notes (Signed)
[facility] [place], [place] [address] Phone: [phone] Subjective:    I'm seeing this patient by the request  of:  Cathlean Cower, MD   CC: Right foot pain,  right knee pain f/u  [id number]  Joan Townsend is a 75 y.o. female coming in with complaint of Right foot pain.  Has seen podiatry. Patient has been diagnosed with more of a midfoot arthritis. Patient was to do over-the-counter orthotics, proper shoes, topical anti-inflammatories. Patient states The pain is doing much better.   Patient is complaining of a new problem. Right shoulder pain. States that it seems to be anterior. Certain movements seem to hurt. Cannot fairly quick over the course last couple weeks. Does not remember any true injury. Mild radiation down the arm. Denies any numbness or tingling. Patient denies any weakness. States that the pain can be painful even to palpation. Denies any fever, chills, any abnormal weight loss.   p  Past Medical History:  Diagnosis Date  . Acquired solitary kidney    s/p right nephrectomy  . Endometriosis   . Hemorrhoids   . Hepatitis    result of Scarlet fever  . History of scarlet fever   . Hyperlipidemia 06/29/2012  . Hypothyroidism   . HYPOTHYROIDISM 01/05/2011   Qualifier: Diagnosis of  By: Carlean Purl MD, Dimas Millin    Past Surgical History:  Procedure Laterality Date  . ABDOMINAL HYSTERECTOMY    . APPENDECTOMY    . CHOLECYSTECTOMY  2012  . COLON RESECTION     part of endometriosis surgery  . COLONOSCOPY  2006   hemorrhoids(Dr. Lajoyce Corners)  . HEMORRHOID BANDING  2014  . NEPHRECTOMY     right, due to birth deformity   Social History   Social History  . Marital status: Widowed    Spouse name: N/A  . Number of children: N/A  . Years of education: N/A   Occupational History  . retired Retired   Social History Main Topics  . Smoking status: Former Smoker    Types: Cigarettes    Quit date: 10/25/1978  . Smokeless tobacco: Never  Used  . Alcohol use Yes     Comment: occasional   . Drug use: No  . Sexual activity: Not Asked   Other Topics Concern  . None   Social History Narrative  . None   Allergies  Allergen Reactions  . Ciprofloxacin     REACTION: rash  . Penicillins     REACTION: rash  . Sulfa Antibiotics     Hives   Family History  Problem Relation Age of Onset  . Breast cancer      great aunts x2, cousin  . Ovarian cancer      aunt  . Uterine cancer Mother   . Colon cancer      great aunts x3  . Heart disease Maternal Grandfather     Past medical history, social, surgical and family history all reviewed in electronic medical record.  No pertanent information unless stated regarding to the chief complaint.   Review of Systems: No headache, visual changes, nausea, vomiting, diarrhea, constipation, dizziness, abdominal pain, skin rash, fevers, chills, night sweats, weight loss, swollen lymph nodes, , chest pain, shortness of breath, mood changes.   Objective  Blood pressure 122/78, pulse 91, weight 178 lb (80.7 kg), SpO2 97 %.  General: No apparent distress alert and oriented x3 mood and affect normal, dressed appropriately.  HEENT: Pupils equal, extraocular  movements intact  Respiratory: Patient's speak in full sentences and does not appear short of breath  Cardiovascular: No lower extremity edema, non tender, no erythema  Skin: Warm dry intact with no signs of infection or rash on extremities or on axial skeleton.  Abdomen: Soft nontender  Neuro: Cranial nerves II through XII are intact, neurovascularly intact in all extremities with 2+ DTRs and 2+ pulses.  Lymph: No lymphadenopathy of posterior or anterior cervical chain or axillae bilaterally.  Gait normal with good balance and coordination.  MSK:  Non tender with full range of motion and good stability and symmetric strength and tone of , elbows, wrist, hip and ankles bilaterally.  Foot exam shows  Pes cavus./  First toe on right foot  has subluxation, severe breakdown of the transverse arch. nontender to palpation over the midfoot. Bunionette formation.   Shoulder: Right Inspection reveals no abnormalities, atrophy or asymmetry. Palpation is normal with no tenderness over AC joint or bicipital groove. ROM is full in all planes passively. Rotator cuff strength normal throughout. signs of impingement with positive Neer and Hawkin's tests, but negative empty can sign. Speeds and Yergason's tests normal. No labral pathology noted with negative Obrien's, negative clunk and good stability. Normal scapular function observed. No painful arc and no drop arm sign. No apprehension sign  MSK US performed of: Right This study was ordered, performed, and interpreted by Charlann Boxer D.O.  Shoulder:   Supraspinatus:  Mild intersubstance tearing but no true tear appreciated Infraspinatus:  Appears normal on long and transverse views.  Subscapularis:  Appears normal on long and transverse views. Mild degenerative tearing noted Teres Minor:  Appears normal on long and transverse views. AC joint:  Capsule undistended, no geyser sign. Glenohumeral Joint:  Appears normal without effusion. Glenoid Labrum:  Intact without visualized tears posterior Biceps Tendon:  Large anterior subdeltoid cyst noted. Significant hypoechoic changes. Increasing Doppler flow surrounding the area but no true vascularization noted. Cannot tell if there is communication to the shoulder joint itself.  Impression: Anterior large cyst of the shoulder  Procedure: Real-time Ultrasound Guided Injection of right subdeltoid cyst joint Device: GE Logiq E  Ultrasound guided injection is preferred based studies that show increased duration, increased effect, greater accuracy, decreased procedural pain, increased response rate with ultrasound guided versus blind injection.  Verbal informed consent obtained.  Time-out conducted.  Noted no overlying erythema, induration, or  other signs of local infection.  Skin prepped in a sterile fashion.  Local anesthesia: Topical Ethyl chloride.  With sterile technique and under real time ultrasound guidance:  Joint visualized.  23g 1  inch needle inserted posterior approach. Pictures taken for needle placement. Patient did have injection of 2 cc of 0.5% Marcaine then aspirated 10 mL of strawlike fluid, and then injected 1.0 cc of Kenalog 40 mg/dL. Completed without difficulty  Pain immediately resolved suggesting accurate placement of the medication.  Advised to call if fevers/chills, erythema, induration, drainage, or persistent bleeding.  Images permanently stored and available for review in the ultrasound unit.  Impression: Technically successful ultrasound guided injection.     Impression and Recommendations:     This case required medical decision making of moderate complexity.      Note: This dictation was prepared with Dragon dictation along with smaller phrase technology. Any transcriptional errors that result from this process are unintentional.

## 2016-07-26 ENCOUNTER — Ambulatory Visit (INDEPENDENT_AMBULATORY_CARE_PROVIDER_SITE_OTHER): Payer: PPO | Admitting: Family Medicine

## 2016-07-26 ENCOUNTER — Other Ambulatory Visit: Payer: Self-pay

## 2016-07-26 ENCOUNTER — Other Ambulatory Visit: Payer: PPO

## 2016-07-26 ENCOUNTER — Ambulatory Visit (INDEPENDENT_AMBULATORY_CARE_PROVIDER_SITE_OTHER)
Admission: RE | Admit: 2016-07-26 | Discharge: 2016-07-26 | Disposition: A | Discharge status: Home / Self Care | Payer: PPO | Source: Ambulatory Visit | Attending: Family Medicine | Admitting: Family Medicine

## 2016-07-26 ENCOUNTER — Encounter: Payer: Self-pay | Admitting: Family Medicine

## 2016-07-26 VITALS — BP 122/78 | HR 91 | Wt 178.0 lb

## 2016-07-26 DIAGNOSIS — M71311 Other bursal cyst, right shoulder: Secondary | ICD-10-CM | POA: Diagnosis not present

## 2016-07-26 DIAGNOSIS — M25511 Pain in right shoulder: Secondary | ICD-10-CM

## 2016-07-26 DIAGNOSIS — M19079 Primary osteoarthritis, unspecified ankle and foot: Secondary | ICD-10-CM

## 2016-07-26 NOTE — Assessment & Plan Note (Signed)
Patient did have the aspiration. Tolerated the procedure well. We discussed icing regimen and home exercises. Patient will try compression. Patient will follow-up in 3 weeks to make sure that there is no reaccumulation. Patient material was sent down to line but appeared to being normal simple synovial fluid. Not seeing communication on the ultrasound. Patient has any worsening symptoms advance imaging could be warranted. Neck shows are pending.

## 2016-07-26 NOTE — Patient Instructions (Addendum)
Good to see you  Lets get an xray today  Ice is always your friend.  We drained the cyst today .  Stay active  Compression sleeve from a sporting store could help  See me again in 3 weeks to make sure it does not come back.

## 2016-07-26 NOTE — Assessment & Plan Note (Signed)
Improved and continue conservative treatment

## 2016-07-28 ENCOUNTER — Telehealth: Payer: Self-pay | Admitting: Family Medicine

## 2016-07-28 NOTE — Telephone Encounter (Signed)
Discussed with pt

## 2016-07-28 NOTE — Telephone Encounter (Signed)
Xrays normal  Awaiting labs still at this time.

## 2016-07-28 NOTE — Telephone Encounter (Signed)
Patient is requesting results of xray and results on testing sent off on cyst.

## 2016-07-29 LAB — SYNOVIAL CELL COUNT + DIFF, W/ CRYSTALS
BASOPHILS, %: 0 %
Eosinophils-Synovial: 0 % (ref 0–2)
LYMPHOCYTES-SYNOVIAL FLD: 8 % (ref 0–74)
MONOCYTE/MACROPHAGE: 15 % (ref 0–69)
NEUTROPHIL, SYNOVIAL: 77 % — AB (ref 0–24)
Synoviocytes, %: 0 % (ref 0–15)
WBC, Synovial: 165 cells/uL — ABNORMAL HIGH (ref <150)

## 2016-07-30 LAB — BODY FLUID CULTURE
Gram Stain: NONE SEEN
Organism ID, Bacteria: NO GROWTH

## 2016-08-01 NOTE — Progress Notes (Signed)
Nothing growing and no crystals.  This means regular cyst Continue with the plan

## 2016-08-09 ENCOUNTER — Ambulatory Visit: Payer: PPO | Admitting: Family Medicine

## 2016-08-09 NOTE — Progress Notes (Signed)
[facility] [place] [place], [place] [address] Phone: [phone] Subjective:    I'm seeing this patient by the request  of:  Cathlean Cower, MD   CC: Right shoulder pain follow-up  [id number]  Joan Townsend is a 75 y.o. female coming in with complaint of Right shoulder pain. Patient was found previously to have an anterior deltoid cyst. Patient did have aspiration done 2 weeks ago. Patient states was doing great for 1 week then starting coming back.  Severe pain again, still localized.       Past Medical History:  Diagnosis Date  . Acquired solitary kidney    s/p right nephrectomy  . Endometriosis   . Hemorrhoids   . Hepatitis    result of Scarlet fever  . History of scarlet fever   . Hyperlipidemia 06/29/2012  . Hypothyroidism   . HYPOTHYROIDISM 01/05/2011   Qualifier: Diagnosis of  By: Carlean Purl MD, Dimas Millin    Past Surgical History:  Procedure Laterality Date  . ABDOMINAL HYSTERECTOMY    . APPENDECTOMY    . CHOLECYSTECTOMY  2012  . COLON RESECTION     part of endometriosis surgery  . COLONOSCOPY  2006   hemorrhoids(Dr. Lajoyce Corners)  . HEMORRHOID BANDING  2014  . NEPHRECTOMY     right, due to birth deformity   Social History   Social History  . Marital status: Widowed    Spouse name: N/A  . Number of children: N/A  . Years of education: N/A   Occupational History  . retired Retired   Social History Main Topics  . Smoking status: Former Smoker    Types: Cigarettes    Quit date: 10/25/1978  . Smokeless tobacco: Never Used  . Alcohol use Yes     Comment: occasional   . Drug use: No  . Sexual activity: Not Asked   Other Topics Concern  . None   Social History Narrative  . None   Allergies  Allergen Reactions  . Ciprofloxacin     REACTION: rash  . Penicillins     REACTION: rash  . Sulfa Antibiotics     Hives   Family History  Problem Relation Age of Onset  . Breast cancer      great aunts x2, cousin  . Ovarian  cancer      aunt  . Uterine cancer Mother   . Colon cancer      great aunts x3  . Heart disease Maternal Grandfather     Past medical history, social, surgical and family history all reviewed in electronic medical record.  No pertanent information unless stated regarding to the chief complaint.   Review of Systems: No headache, visual changes, nausea, vomiting, diarrhea, constipation, dizziness, abdominal pain, skin rash, fevers, chills, night sweats, weight loss, swollen lymph nodes, , chest pain, shortness of breath, mood changes.   Objective  Blood pressure 126/74, pulse 65, weight 178 lb (80.7 kg), SpO2 97 %.  General: No apparent distress alert and oriented x3 mood and affect normal, dressed appropriately.  HEENT: Pupils equal, extraocular movements intact  Respiratory: Patient's speak in full sentences and does not appear short of breath  Cardiovascular: No lower extremity edema, non tender, no erythema  Skin: Warm dry intact with no signs of infection or rash on extremities or on axial skeleton.  Abdomen: Soft nontender  Neuro: Cranial nerves II through XII are intact, neurovascularly intact in all extremities with 2+ DTRs and 2+ pulses.  Lymph: No lymphadenopathy of posterior or anterior cervical chain or axillae bilaterally.  Gait normal with good balance and coordination.  MSK:  Non tender with full range of motion and good stability and symmetric strength and tone of , elbows, wrist, hip and ankles bilaterally.  Foot exam shows  Pes cavus./  First toe on right foot has subluxation, severe breakdown of the transverse arch. nontender to palpation over the midfoot. Bunionette formation.   Shoulder: Right Inspection reveals no abnormalities, atrophy or asymmetry. TTP over anterior deltoid ROM is full in all planes passively. Rotator cuff strength normal throughout. signs of impingement with positive Neer and Hawkin's tests, but negative empty can sign. Speeds and Yergason's  tests normal. + labral pathology . Normal scapular function observed. No painful arc and no drop arm sign. No apprehension sign    Procedure: Real-time Ultrasound Guided Injection of right subdeltoid cyst joint Device: GE Logiq E  Ultrasound guided injection is preferred based studies that show increased duration, increased effect, greater accuracy, decreased procedural pain, increased response rate with ultrasound guided versus blind injection.  Verbal informed consent obtained.  Time-out conducted.  Noted no overlying erythema, induration, or other signs of local infection.  Skin prepped in a sterile fashion.  Local anesthesia: Topical Ethyl chloride.  With sterile technique and under real time ultrasound guidance:  Joint visualized.  23g 1  inch needle inserted posterior approach. Pictures taken for needle placement. Patient did have injection of 2 cc of 0.5% Marcaine then aspirated 8 mL of strawlike fluid, and then injected 1.0 cc of Kenalog 40 mg/dL. Completed without difficulty  Pain immediately resolved suggesting accurate placement of the medication.  Advised to call if fevers/chills, erythema, induration, drainage, or persistent bleeding.  Images permanently stored and available for review in the ultrasound unit.  Impression: Technically successful ultrasound guided injection.     Impression and Recommendations:     This case required medical decision making of moderate complexity.      Note: This dictation was prepared with Dragon dictation along with smaller phrase technology. Any transcriptional errors that result from this process are unintentional.

## 2016-08-10 ENCOUNTER — Encounter: Payer: Self-pay | Admitting: Family Medicine

## 2016-08-10 ENCOUNTER — Ambulatory Visit (INDEPENDENT_AMBULATORY_CARE_PROVIDER_SITE_OTHER): Payer: PPO | Admitting: Family Medicine

## 2016-08-10 DIAGNOSIS — M71311 Other bursal cyst, right shoulder: Secondary | ICD-10-CM | POA: Diagnosis not present

## 2016-08-10 NOTE — Assessment & Plan Note (Signed)
Patient had aspiration again today. Tolerated procedure well. We discussed icing regimen and home exercises. We discussed which activities doing which ones to avoid. Patient and will come back and see me again in 2-4 weeks. If worsening symptoms I am concerned with patient having more of a labral pathology or something internal canal causing this. Ultrasound unable to see if this seems to go intra-articular.

## 2016-08-10 NOTE — Patient Instructions (Addendum)
Good to see you We aspirated the cyst again.  Lets hope it last longer then 2 weeks.  If having worsening pain again write me and we will get the MR-arthrogram to rule out any type of labral tear.

## 2016-08-12 ENCOUNTER — Other Ambulatory Visit: Payer: PPO

## 2016-08-12 DIAGNOSIS — M25511 Pain in right shoulder: Secondary | ICD-10-CM

## 2016-08-16 ENCOUNTER — Telehealth: Payer: Self-pay | Admitting: Family Medicine

## 2016-08-16 DIAGNOSIS — M25511 Pain in right shoulder: Secondary | ICD-10-CM

## 2016-08-16 NOTE — Telephone Encounter (Signed)
Patient called and stated her shoulder is still hurting. She wants to come on here sooner than her 3 week fu. But first she stated she wanted the nurse to call her back. Please follow up with patient, Thank you.

## 2016-08-16 NOTE — Telephone Encounter (Signed)
Spoke with pt, ordered MR Arthorgram of right shoulder.

## 2016-08-18 ENCOUNTER — Ambulatory Visit: Payer: PPO | Admitting: Family Medicine

## 2016-08-19 DIAGNOSIS — H26492 Other secondary cataract, left eye: Secondary | ICD-10-CM | POA: Diagnosis not present

## 2016-08-26 DIAGNOSIS — H26491 Other secondary cataract, right eye: Secondary | ICD-10-CM | POA: Diagnosis not present

## 2016-08-26 DIAGNOSIS — H26492 Other secondary cataract, left eye: Secondary | ICD-10-CM | POA: Diagnosis not present

## 2016-08-30 ENCOUNTER — Telehealth: Payer: Self-pay | Admitting: Internal Medicine

## 2016-08-30 DIAGNOSIS — N390 Urinary tract infection, site not specified: Secondary | ICD-10-CM

## 2016-08-30 NOTE — Telephone Encounter (Signed)
Ok, order done.

## 2016-08-30 NOTE — Telephone Encounter (Signed)
Pt aware.

## 2016-08-30 NOTE — Telephone Encounter (Signed)
Patient is requesting another urinalysis to make sure urine has cleared up.

## 2016-08-31 ENCOUNTER — Other Ambulatory Visit (INDEPENDENT_AMBULATORY_CARE_PROVIDER_SITE_OTHER): Payer: PPO

## 2016-08-31 DIAGNOSIS — N390 Urinary tract infection, site not specified: Secondary | ICD-10-CM | POA: Diagnosis not present

## 2016-08-31 LAB — URINALYSIS, ROUTINE W REFLEX MICROSCOPIC
Bilirubin Urine: NEGATIVE
Ketones, ur: NEGATIVE
LEUKOCYTES UA: NEGATIVE
NITRITE: NEGATIVE
PH: 6 (ref 5.0–8.0)
Specific Gravity, Urine: <=1.005 — AB (ref 1.000–1.030)
TOTAL PROTEIN, URINE-UPE24: NEGATIVE
URINE GLUCOSE: NEGATIVE
Urobilinogen, UA: 0.2 (ref 0.0–1.0)

## 2016-09-01 ENCOUNTER — Encounter: Payer: Self-pay | Admitting: Vascular Surgery

## 2016-09-02 ENCOUNTER — Ambulatory Visit (INDEPENDENT_AMBULATORY_CARE_PROVIDER_SITE_OTHER): Payer: PPO | Admitting: Family Medicine

## 2016-09-02 ENCOUNTER — Encounter: Payer: Self-pay | Admitting: Family Medicine

## 2016-09-02 ENCOUNTER — Ambulatory Visit: Payer: Self-pay

## 2016-09-02 VITALS — BP 132/84 | HR 82 | Ht 65.0 in | Wt 181.0 lb

## 2016-09-02 DIAGNOSIS — M25511 Pain in right shoulder: Secondary | ICD-10-CM

## 2016-09-02 DIAGNOSIS — M71311 Other bursal cyst, right shoulder: Secondary | ICD-10-CM

## 2016-09-02 NOTE — Progress Notes (Signed)
[facility] [address], [place] [address] Phone: [phone] Subjective:    I'm seeing this patient by the request  of:  Cathlean Cower, MD   CC: Right shoulder pain follow-up  [id number]  Joan Townsend is a 75 y.o. female coming in with complaint of Right shoulder pain. Patient was found previously to have an anterior deltoid cyst. Patient initially did not have as much improvement with the aspiration of the cyst. Patient was scheduled to have an MR arthrogram done to further evaluate this is. Patient states over the course last several weeks it has improved significantly. Patient states that she has complete range of motion. Very mild discomfort going down the arm but nothing severe. Patient denies any fevers, chills, any abnormal weight loss. Not affecting daily activities and she is sleeping comfortably.      Past Medical History:  Diagnosis Date  . Acquired solitary kidney    s/p right nephrectomy  . Endometriosis   . Hemorrhoids   . Hepatitis    result of Scarlet fever  . History of scarlet fever   . Hyperlipidemia 06/29/2012  . Hypothyroidism   . HYPOTHYROIDISM 01/05/2011   Qualifier: Diagnosis of  By: Carlean Purl MD, Dimas Millin    Past Surgical History:  Procedure Laterality Date  . ABDOMINAL HYSTERECTOMY    . APPENDECTOMY    . CHOLECYSTECTOMY  2012  . COLON RESECTION     part of endometriosis surgery  . COLONOSCOPY  2006   hemorrhoids(Dr. Lajoyce Corners)  . HEMORRHOID BANDING  2014  . NEPHRECTOMY     right, due to birth deformity   Social History   Social History  . Marital status: Widowed    Spouse name: N/A  . Number of children: N/A  . Years of education: N/A   Occupational History  . retired Retired   Social History Main Topics  . Smoking status: Former Smoker    Types: Cigarettes    Quit date: 10/25/1978  . Smokeless tobacco: Never Used  . Alcohol use Yes     Comment: occasional   . Drug use: No  . Sexual activity: Not  Asked   Other Topics Concern  . None   Social History Narrative  . None   Allergies  Allergen Reactions  . Ciprofloxacin     REACTION: rash  . Penicillins     REACTION: rash  . Sulfa Antibiotics     Hives   Family History  Problem Relation Age of Onset  . Breast cancer      great aunts x2, cousin  . Ovarian cancer      aunt  . Uterine cancer Mother   . Colon cancer      great aunts x3  . Heart disease Maternal Grandfather     Past medical history, social, surgical and family history all reviewed in electronic medical record.  No pertanent information unless stated regarding to the chief complaint.   Review of Systems: No headache, visual changes, nausea, vomiting, diarrhea, constipation, dizziness, abdominal pain, skin rash, fevers, chills, night sweats, weight loss, swollen lymph nodes, , chest pain, shortness of breath, mood changes.   Objective  Blood pressure 132/84, pulse 82, height 5\' 5"  (1.651 m), weight 181 lb (82.1 kg), SpO2 97 %.  Systems examined below as of 09/02/16 General: NAD A&O x3 mood, affect normal  HEENT: Pupils equal, extraocular movements intact no nystagmus Respiratory: not short of breath at rest or with speaking Cardiovascular:  No lower extremity edema, non tender Skin: Warm dry intact with no signs of infection or rash on extremities or on axial skeleton. Abdomen: Soft nontender, no masses Neuro: Cranial nerves  intact, neurovascularly intact in all extremities with 2+ DTRs and 2+ pulses. Lymph: No lymphadenopathy appreciated today  Gait normal with good balance and coordination.  MSK: Non tender with full range of motion and good stability and symmetric strength and tone of  elbows, wrist,  knee hips and ankles bilaterally.   Shoulder: Right Inspection reveals no abnormalities, atrophy or asymmetry. Nontender on exam today ROM is full in all planes passively. Rotator cuff strength normal throughout. signs of impingement with positive Neer  and Hawkin's tests, but negative empty can sign. Speeds and Yergason's tests normal. Continue mild positive labral pathology Normal scapular function observed. No painful arc and no drop arm sign. No apprehension sign Contralateral shoulder unremarkable  MSK US performed of: Right shoulder This study was ordered, performed, and interpreted by Charlann Boxer D.O.  Shoulder:   In area where patient did have the right anterior cyst there is no significant mass appreciated. Mild increase in Doppler flow the surrounding musculature. Impression resolved anterior deltoid cyst.       Impression and Recommendations:     This case required medical decision making of moderate complexity.      Note: This dictation was prepared with Dragon dictation along with smaller phrase technology. Any transcriptional errors that result from this process are unintentional.

## 2016-09-02 NOTE — Assessment & Plan Note (Signed)
Discussed with patient again at great length. Patient states that she is doing better. She is now 3 weeks out from last aspiration. Discussed with patient again at length. Patient is a low risk for potential cancer. Patient is on estrogen supplementation. No mass lesions appreciated. Discussed with patient at great length. Patient does not have any findings at this moment and is having no pain. Patient's like to hold on the MRI which I agree with at this time. We discussed any worsening symptoms that she should come back immediately. At that time we'll consider another aspiration or any advanced imaging if it occurs. Patient will continue the exercises 2-3 times a week. Follow-up as needed.

## 2016-09-03 ENCOUNTER — Encounter: Payer: Self-pay | Admitting: Vascular Surgery

## 2016-09-07 ENCOUNTER — Encounter: Payer: PPO | Admitting: Vascular Surgery

## 2016-09-09 ENCOUNTER — Other Ambulatory Visit: Payer: PPO

## 2016-09-09 ENCOUNTER — Inpatient Hospital Stay: Admission: RE | Admit: 2016-09-09 | Payer: PPO | Source: Ambulatory Visit

## 2016-09-10 ENCOUNTER — Encounter: Payer: Self-pay | Admitting: Vascular Surgery

## 2016-09-13 ENCOUNTER — Ambulatory Visit (INDEPENDENT_AMBULATORY_CARE_PROVIDER_SITE_OTHER): Payer: PPO | Admitting: Vascular Surgery

## 2016-09-13 ENCOUNTER — Encounter: Payer: Self-pay | Admitting: Vascular Surgery

## 2016-09-13 VITALS — BP 160/77 | HR 81 | Temp 97.1°F | Resp 16 | Ht 66.0 in | Wt 181.0 lb

## 2016-09-13 DIAGNOSIS — I8393 Asymptomatic varicose veins of bilateral lower extremities: Secondary | ICD-10-CM | POA: Diagnosis not present

## 2016-09-13 NOTE — Progress Notes (Signed)
Subjective:     Patient ID: Joan Townsend, female   DOB: 13-Mar-1941, 75 y.o.   MRN: [id number]  HPI This 75 year old female was referred by Dr. Cathlean Cower for evaluation of varicose veins both lower extremities. Patient states he has had prominent broken veins in both legs for the last several years which have become more extensive. She has no history of DVT thrombophlebitis stasis ulcers or bleeding. 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  Past Medical History:  Diagnosis Date  . Acquired solitary kidney    s/p right nephrectomy  . Endometriosis   . Hemorrhoids   . Hepatitis    result of Scarlet fever  . History of scarlet fever   . Hyperlipidemia 06/29/2012  . Hypothyroidism   . HYPOTHYROIDISM 01/05/2011   Qualifier: Diagnosis of  By: Carlean Purl MD, Dimas Millin     Social History  Substance Use Topics  . Smoking status: Former Smoker    Types: Cigarettes    Quit date: 10/25/1978  . Smokeless tobacco: Never Used  . Alcohol use Yes     Comment: occasional     Family History  Problem Relation Age of Onset  . Breast cancer      great aunts x2, cousin  . Ovarian cancer      aunt  . Uterine cancer Mother   . Colon cancer      great aunts x3  . Heart disease Maternal Grandfather     Allergies  Allergen Reactions  . Ciprofloxacin Rash    REACTION: rash REACTION: rash  . Penicillins Rash    REACTION: rash REACTION: rash  . Sulfa Antibiotics     Hives  . Sulfacetamide Sodium Hives    Hives  . Cefaclor      Current Outpatient Prescriptions:  .  Ascorbic Acid (VITAMIN C) 1000 MG tablet, Take 1,000 mg by mouth daily., Disp: , Rfl:  .  Cholecalciferol (VITAMIN D-3) 5000 UNITS TABS, Take by mouth daily., Disp: , Rfl:  .  Coenzyme Q10 (CO Q 10) 100 MG CAPS, Take 1 capsule by mouth daily., Disp: , Rfl:  .  estradiol (ESTRACE) 1 MG tablet, Take 1 mg by mouth daily.  , Disp: , Rfl:  .  hydrocortisone  (ANUSOL-HC) 25 MG suppository, Place 1 suppository (25 mg total) rectally every 12 (twelve) hours., Disp: 12 suppository, Rfl: 0 .  levothyroxine (SYNTHROID, LEVOTHROID) 125 MCG tablet, Take 1 tablet (125 mcg total) by mouth daily before breakfast., Disp: 90 tablet, Rfl: 0 .  Magnesium 100 MG CAPS, Take by mouth., Disp: , Rfl:  .  Misc Natural Products (TART CHERRY ADVANCED) CAPS, Take 1 capsule by mouth 2 (two) times daily., Disp: , Rfl:  .  Olive Leaf Extract 150 MG CAPS, Take by mouth., Disp: , Rfl:  .  POTASSIUM IODIDE, ANTIDOTE, PO, Take by mouth daily., Disp: , Rfl:  .  TURMERIC PO, Take 750 mg by mouth daily. , Disp: , Rfl:  .  vitamin B-12 (CYANOCOBALAMIN) 1000 MCG tablet, Take 1,000 mcg by mouth daily., Disp: , Rfl:  .  hydrocortisone (ANUSOL-HC) 2.5 % rectal cream, Place 1 application rectally 2 (two) times daily as needed for hemorrhoids or itching. (Patient not taking: Reported on 09/13/2016), Disp: 30 g, Rfl: 2  Vitals:   09/13/16 1131  BP: (!) 160/77  Pulse: 81  Resp: 16  Temp: 97.1 F (36.2 C)  SpO2: 99%  Weight: 181  lb (82.1 kg)  Height: 5\' 6"  (1.676 m)    Body mass index is 29.21 kg/m.         Review of Systems denies chest pain, dyspnea on exertion, PND, orthopnea, hemoptysis. Does have hypertension and hemorrhoids     Objective:   Physical Exam BP (!) 160/77 (BP Location: Left Arm, Patient Position: Sitting, Cuff Size: Large)   Pulse 81   Temp 97.1 F (36.2 C)   Resp 16   Ht 5\' 6"  (1.676 m)   Wt 181 lb (82.1 kg)   SpO2 99%   BMI 29.21 kg/m     Gen.-alert and oriented x3 in no apparent distress HEENT normal for age Lungs no rhonchi or wheezing Cardiovascular regular rhythm no murmurs carotid pulses 3+ palpable no bruits audible Abdomen soft nontender no palpable masses Musculoskeletal free of  major deformities Skin clear -no rashes Neurologic normal Lower extremities 3+ femoral and dorsalis pedis pulses palpable bilaterally with no  edema  Spider veins in the medial and lateral thighs and typical distribution with a prominent vein in the posterior aspect of her right distal thigh extending into the popliteal fossa bilaterally. No bulging varicosities noted in either lower extremity. No hyperpigmentation or ulceration noted.  Performed a bedside SonoSite-ultrasound exam which reveals normal-sized great saphenous veins with no evidence of reflux     Assessment:     Bilateral spider veins    Plan:     Have discussed with patient that treatment would involve foam sclerotherapy if she should desire this. Also have told her that the discomfort she has surrounding these areas may not be relieved by treatment but that he would be primarily a cosmetic procedure  She will consider this

## 2016-11-02 ENCOUNTER — Encounter: Payer: PPO | Admitting: Vascular Surgery

## 2016-11-08 ENCOUNTER — Telehealth: Payer: Self-pay | Admitting: Internal Medicine

## 2016-11-08 NOTE — Telephone Encounter (Signed)
Spoke with patient regarding awv. Pt stated she would like a call back sometime in April to schedule appt.

## 2016-12-07 DIAGNOSIS — H578 Other specified disorders of eye and adnexa: Secondary | ICD-10-CM | POA: Diagnosis not present

## 2016-12-07 DIAGNOSIS — H40033 Anatomical narrow angle, bilateral: Secondary | ICD-10-CM | POA: Diagnosis not present

## 2016-12-10 DIAGNOSIS — H04123 Dry eye syndrome of bilateral lacrimal glands: Secondary | ICD-10-CM | POA: Diagnosis not present

## 2016-12-22 ENCOUNTER — Telehealth: Payer: Self-pay | Admitting: Internal Medicine

## 2016-12-22 DIAGNOSIS — R300 Dysuria: Secondary | ICD-10-CM

## 2016-12-22 NOTE — Telephone Encounter (Signed)
Patient believes she has a UTI.  She does not want to make appointment. She states usually Dr. Jenny Reichmann will send orders to lab. Patient is requesting this to be done.  Please follow up in regard.

## 2016-12-23 ENCOUNTER — Other Ambulatory Visit (INDEPENDENT_AMBULATORY_CARE_PROVIDER_SITE_OTHER): Payer: PPO

## 2016-12-23 DIAGNOSIS — R300 Dysuria: Secondary | ICD-10-CM | POA: Diagnosis not present

## 2016-12-23 DIAGNOSIS — R3989 Other symptoms and signs involving the genitourinary system: Secondary | ICD-10-CM

## 2016-12-23 LAB — URINALYSIS, ROUTINE W REFLEX MICROSCOPIC
Bilirubin Urine: NEGATIVE
NITRITE: POSITIVE — AB
PH: 6 (ref 5.0–8.0)
Specific Gravity, Urine: 1.02 (ref 1.000–1.030)
TOTAL PROTEIN, URINE-UPE24: 30 — AB
Urine Glucose: NEGATIVE
Urobilinogen, UA: 0.2 (ref 0.0–1.0)

## 2016-12-23 MED ORDER — NITROFURANTOIN MONOHYD MACRO 100 MG PO CAPS
100.0000 mg | ORAL_CAPSULE | Freq: Two times a day (BID) | ORAL | 0 refills | Status: DC
Start: 2016-12-23 — End: 2017-04-08

## 2016-12-23 NOTE — Telephone Encounter (Signed)
Patient states she is on her way in to lab.  Is requesting labs to be entered.

## 2016-12-23 NOTE — Telephone Encounter (Signed)
Notified patient.

## 2016-12-23 NOTE — Telephone Encounter (Signed)
[facility] for antibx this time -   But also needs UA and cx - thanks

## 2016-12-23 NOTE — Telephone Encounter (Signed)
Orders have been entered and abx has been sent to [facility]

## 2016-12-25 LAB — URINE CULTURE

## 2016-12-25 NOTE — Telephone Encounter (Signed)
Received call from [facility] on Saturday Clinic office phone. Patient was seen 2 days ago with UTI and prescribed nitrofurantoin. Had fever of 103 last night and feels badly today and has not gotten out of bed. Instructed [facility] to advise patient's daughter to take her to ER.

## 2017-02-08 ENCOUNTER — Other Ambulatory Visit: Payer: Self-pay

## 2017-02-08 MED ORDER — LEVOTHYROXINE SODIUM 125 MCG PO TABS: 125.0000 ug | ORAL_TABLET | Freq: Every day | ORAL | 1 refills | Status: DC

## 2017-04-04 ENCOUNTER — Telehealth: Payer: Self-pay | Admitting: Internal Medicine

## 2017-04-04 DIAGNOSIS — Z1389 Encounter for screening for other disorder: Secondary | ICD-10-CM

## 2017-04-04 NOTE — Telephone Encounter (Signed)
Pt called in and said that she is coming in for an appt on Friday to fu on kidneys and would like Dr Jenny Reichmann to put in order to check her urine before she comes in on Friday

## 2017-04-04 NOTE — Telephone Encounter (Signed)
Done

## 2017-04-06 ENCOUNTER — Other Ambulatory Visit (INDEPENDENT_AMBULATORY_CARE_PROVIDER_SITE_OTHER): Payer: PPO

## 2017-04-06 DIAGNOSIS — Z1389 Encounter for screening for other disorder: Secondary | ICD-10-CM | POA: Diagnosis not present

## 2017-04-06 LAB — URINALYSIS, ROUTINE W REFLEX MICROSCOPIC
BILIRUBIN URINE: NEGATIVE
KETONES UR: NEGATIVE
NITRITE: NEGATIVE
PH: 6 (ref 5.0–8.0)
Specific Gravity, Urine: <=1.005 — AB (ref 1.000–1.030)
Total Protein, Urine: NEGATIVE
Urine Glucose: NEGATIVE
Urobilinogen, UA: 0.2 (ref 0.0–1.0)

## 2017-04-07 ENCOUNTER — Telehealth: Payer: Self-pay

## 2017-04-07 MED ORDER — NITROFURANTOIN MACROCRYSTAL 50 MG PO CAPS: 50.0000 mg | ORAL_CAPSULE | Freq: Four times a day (QID) | ORAL | 0 refills | Status: DC

## 2017-04-07 NOTE — Telephone Encounter (Signed)
-----   Message from Biagio Borg, MD sent at 04/06/2017  7:00 PM EDT ----- [place] for shirron to contact pt - is she having any UTI symptoms? Such as dysuria, frequency, urgency, flank pain, hematuria or n/v, fever, chills.

## 2017-04-07 NOTE — Telephone Encounter (Signed)
Ok to start antibiotic today, then seen at [facility] tomorrow

## 2017-04-07 NOTE — Telephone Encounter (Signed)
Pt was informed and expressed understanding but I had to resend me to the correct pharmacy [facility] on [address].

## 2017-04-07 NOTE — Telephone Encounter (Addendum)
She stated that she had a little fever, urgency and urinary frequency but has an appt with you tomorrow.

## 2017-04-07 NOTE — Addendum Note (Signed)
Addended by: Biagio Borg on: 04/07/2017 01:30 PM   Modules accepted: Orders

## 2017-04-07 NOTE — Addendum Note (Signed)
Addended by: Juliet Rude on: 04/07/2017 01:39 PM   Modules accepted: Orders

## 2017-04-08 ENCOUNTER — Ambulatory Visit (INDEPENDENT_AMBULATORY_CARE_PROVIDER_SITE_OTHER): Payer: PPO | Admitting: Internal Medicine

## 2017-04-08 ENCOUNTER — Encounter: Payer: Self-pay | Admitting: Internal Medicine

## 2017-04-08 ENCOUNTER — Telehealth: Payer: Self-pay

## 2017-04-08 VITALS — BP 132/84 | HR 68 | Ht 66.0 in | Wt 166.0 lb

## 2017-04-08 DIAGNOSIS — N390 Urinary tract infection, site not specified: Secondary | ICD-10-CM | POA: Diagnosis not present

## 2017-04-08 DIAGNOSIS — J019 Acute sinusitis, unspecified: Secondary | ICD-10-CM | POA: Diagnosis not present

## 2017-04-08 DIAGNOSIS — Z0000 Encounter for general adult medical examination without abnormal findings: Secondary | ICD-10-CM

## 2017-04-08 DIAGNOSIS — I10 Essential (primary) hypertension: Secondary | ICD-10-CM

## 2017-04-08 MED ORDER — NITROFURANTOIN MONOHYD MACRO 100 MG PO CAPS: 100.0000 mg | ORAL_CAPSULE | Freq: Two times a day (BID) | ORAL | 0 refills | Status: DC

## 2017-04-08 MED ORDER — AZITHROMYCIN 250 MG PO TABS: ORAL_TABLET | ORAL | 1 refills | Status: DC

## 2017-04-08 NOTE — Patient Instructions (Addendum)
Please take all new medication as prescribed - the Zpack  Please continue all other medications as before, including the nitrofurantoin  Please have the pharmacy call with any other refills you may need.  Please keep your appointments with your specialists as you may have planned  Please return in 3 months, or sooner if needed, with Lab testing done 3-5 days before

## 2017-04-08 NOTE — Progress Notes (Signed)
Subjective:    Patient ID: Joan Townsend, female    DOB: 12-Dec-1940, 76 y.o.   MRN: [id number]  HPI   Here with 2-3 days acute onset fever, facial pain, ST, headache, general weakness and malaise, everything aches and greenish d/c, with mild ST and cough, but pt denies chest pain, wheezing, increased sob or doe, orthopnea, PND, increased LE swelling, palpitations, dizziness or syncope.  Also incidently Has had urinary symptoms such as dysuria, frequency, and some nocturia, but no urgency, flank pain, hematuria or n/v, fever, chills, but has lower mid abd pain as well.  Was noted on UA yesterday and has taken 2 doses nitrofurantoin without significant change so far. Past Medical History:  Diagnosis Date  . Acquired solitary kidney    s/p right nephrectomy  . Endometriosis   . Hemorrhoids   . Hepatitis    result of Scarlet fever  . History of scarlet fever   . Hyperlipidemia 06/29/2012  . Hypothyroidism   . HYPOTHYROIDISM 01/05/2011   Qualifier: Diagnosis of  By: Carlean Purl MD, Dimas Millin    Past Surgical History:  Procedure Laterality Date  . ABDOMINAL HYSTERECTOMY    . APPENDECTOMY    . CHOLECYSTECTOMY  2012  . COLON RESECTION     part of endometriosis surgery  . COLONOSCOPY  2006   hemorrhoids(Dr. Lajoyce Corners)  . HEMORRHOID BANDING  2014  . NEPHRECTOMY     right, due to birth deformity    reports that she quit smoking about 38 years ago. Her smoking use included Cigarettes. She has never used smokeless tobacco. She reports that she drinks alcohol. She reports that she does not use drugs. family history includes Heart disease in her maternal grandfather; Uterine cancer in her mother. Allergies  Allergen Reactions  . Ciprofloxacin Rash    REACTION: rash REACTION: rash  . Penicillins Rash    REACTION: rash REACTION: rash  . Sulfa Antibiotics     Hives  . Sulfacetamide Sodium Hives    Hives  . Cefaclor    Current Outpatient Prescriptions on File Prior to Visit  Medication Sig  Dispense Refill  . Ascorbic Acid (VITAMIN C) 1000 MG tablet Take 1,000 mg by mouth daily.    . Cholecalciferol (VITAMIN D-3) 5000 UNITS TABS Take by mouth daily.    . Coenzyme Q10 (CO Q 10) 100 MG CAPS Take 1 capsule by mouth daily.    Marland Kitchen estradiol (ESTRACE) 1 MG tablet Take 1 mg by mouth daily.      . hydrocortisone (ANUSOL-HC) 2.5 % rectal cream Place 1 application rectally 2 (two) times daily as needed for hemorrhoids or itching. 30 g 2  . hydrocortisone (ANUSOL-HC) 25 MG suppository Place 1 suppository (25 mg total) rectally every 12 (twelve) hours. 12 suppository 0  . levothyroxine (SYNTHROID, LEVOTHROID) 125 MCG tablet Take 1 tablet (125 mcg total) by mouth daily before breakfast. 90 tablet 1  . Magnesium 100 MG CAPS Take by mouth.    . Misc Natural Products (TART CHERRY ADVANCED) CAPS Take 1 capsule by mouth 2 (two) times daily.    [name] 150 MG CAPS Take by mouth.    Marland Kitchen POTASSIUM IODIDE, ANTIDOTE, PO Take by mouth daily.    . TURMERIC PO Take 750 mg by mouth daily.     . vitamin B-12 (CYANOCOBALAMIN) 1000 MCG tablet Take 1,000 mcg by mouth daily.     No current facility-administered medications on file prior to visit.    Review of  Systems  Constitutional: Negative for other unusual diaphoresis or sweats HENT: Negative for ear discharge or swelling Eyes: Negative for other worsening visual disturbances Respiratory: Negative for stridor or other swelling  Gastrointestinal: Negative for worsening distension or other blood Genitourinary: Negative for retention or other urinary change Musculoskeletal: Negative for other MSK pain or swelling Skin: Negative for color change or other new lesions Neurological: Negative for worsening tremors and other numbness  Psychiatric/Behavioral: Negative for worsening agitation or other fatigue All other system neg per pt    Objective:   Physical Exam BP 132/84   Pulse 68   Ht 5\' 6"  (1.676 m)   Wt 166 lb (75.3 kg)   SpO2 97%   BMI  26.79 kg/m  VS noted, mild ill Constitutional: Pt appears in NAD HENT: Head: NCAT.  Right Ear: External ear normal.  Left Ear: External ear normal.  Bilat tm's with mild erythema.  Max sinus areas mild tender.  Pharynx with mild erythema, no exudate Eyes: . Pupils are equal, round, and reactive to light. Conjunctivae and EOM are normal Nose: without d/c or deformity Neck: Neck supple. Gross normal ROM Cardiovascular: Normal rate and regular rhythm.   Pulmonary/Chest: Effort normal and breath sounds without rales or wheezing.  Abd:  Soft, mild lower mid abd tender, ND, + BS, no organomegaly, no guarding or rebound, no flank tender Neurological: Pt is alert. At baseline orientation, motor grossly intact Skin: Skin is warm. No rashes, other new lesions, no LE edema Psychiatric: Pt behavior is normal without agitation , 1+ nervous No other exam findings  Urinalysis, Routine w reflex microscopic  Order: [id number]  Status:  Final result Visible to patient:  Yes (MyChart) Next appt:  None Dx:  Screening for blood or protein in urine  Notes recorded by Biagio Borg, MD on 04/06/2017 at 7:00 PM EDT [place] for shirron to contact pt - is she having any UTI symptoms? Such as dysuria, frequency, urgency, flank pain, hematuria or n/v, fever, chills.    Ref Range & Units 3d ago 72mo ago 31mo ago   Color, Urine Yellow;Lt. Yellow YELLOW  YELLOW  YELLOW    APPearance Clear CLEAR  Cloudy   CLEAR    Specific Gravity, Urine 1.000 - 1.030 <=1.005   1.020  <=1.005     pH 5.0 - 8.0 6.0  6.0  6.0    Total Protein, Urine Negative NEGATIVE  30   NEGATIVE    Urine Glucose Negative NEGATIVE  NEGATIVE  NEGATIVE    Ketones, ur Negative NEGATIVE  TRACE   NEGATIVE    Bilirubin Urine Negative NEGATIVE  NEGATIVE  NEGATIVE    Hgb urine dipstick Negative SMALL   MODERATE   TRACE-LYSED     Urobilinogen, UA 0.0 - 1.0 0.2  0.2  0.2    Leukocytes, UA Negative MODERATE   LARGE   NEGATIVE    Nitrite Negative NEGATIVE  POSITIVE    NEGATIVE    WBC, UA 0-2/hpf 11-20/hpf   TNTC(>50/hpf)   0-2/hpf    RBC / HPF 0-2/hpf 0-2/hpf  11-20/hpf   0-2/hpf    Squamous Epithelial / LPF Rare(0-4/hpf) Few(5-10/hpf)   Rare(0-4/hpf)  Rare(0-4/hpf)    Bacteria, UA None Few(10-50/hpf)   Many(>50/hpf)              Assessment & Plan:

## 2017-04-08 NOTE — Telephone Encounter (Signed)
Per Gareth Eagle the patient called in yelling at her about medication changes before the call was forwarded to me. She was instructed today while in office per JJ on why she was taking the 2 medications she was given today. At this time the patient is wanting to know why he changed the medication that was sent in yesterday when it was the same thing as something she got today. During this whole conversation the patient is yelling and cursing so I told her that language was not needed. I stated I would get with JJ and find out and call her back. Once speaking with JJ he stated one was for her UTI and the other was for her upper resp infection as he had explained to her during her OV. He stated that the script sent in yesterday was not filled correctly and should have been filled for 2 tablets a day instead of 4 but did mention that normally it is used as 4 tablets a day but not for this situation. I called the patient back to let her know that and she is still upset and yelling at me about having to buy "everything sold at the pharmacy". At this point I told the patient the issue with how the medication was filled on the pharmacy's end. She started repeating herself stating well why was the same thing sent in again. I explained everything to her again about how the medication from yesterday does not need to be taken anymore like how we discussed during the first telephone call and while she is still being hostile I told her I will not argue with her but I am trying to clarify the situation. She said well ill just call the pharmacy and see what they have to say about it. She started yelling again so I said ok feel free to call the pharmacy and said have a nice day and hung the phone up.

## 2017-04-08 NOTE — Telephone Encounter (Signed)
All I can say is there was some sort of error at some level on the original nitrofurantoin prescription  She was given the correct nitrofurantoin, and the zpack to take at same time, as she has 2 problems, and several antibiotic allergies

## 2017-04-09 NOTE — Assessment & Plan Note (Signed)
stable overall by history and exam, recent data reviewed with pt, and pt to continue medical treatment as before,  to f/u any worsening symptoms or concerns BP Readings from Last 3 Encounters:  04/08/17 132/84  09/13/16 (!) 160/77  09/02/16 132/84

## 2017-04-09 NOTE — Assessment & Plan Note (Signed)
Mild to mod, for antibx course,  to f/u any worsening symptoms or concerns, urine culture pending

## 2017-04-09 NOTE — Assessment & Plan Note (Signed)
Mild to mod, for antibx course,  to f/u any worsening symptoms or concerns 

## 2017-04-26 ENCOUNTER — Telehealth: Payer: Self-pay | Admitting: Internal Medicine

## 2017-04-26 NOTE — Telephone Encounter (Signed)
Pt declined awv  °

## 2017-06-08 ENCOUNTER — Telehealth: Payer: Self-pay | Admitting: Internal Medicine

## 2017-06-08 MED ORDER — ESTRADIOL 1 MG PO TABS: 1.0000 mg | ORAL_TABLET | Freq: Every day | ORAL | 3 refills | Status: AC

## 2017-06-08 NOTE — Telephone Encounter (Signed)
This has not been prescribed by me in the past for this pt, but does appear on her med list  Please ask pt to contact provider normally responsible for refills , thanks

## 2017-06-08 NOTE — Telephone Encounter (Signed)
Pt called requesting a refill on her estradiol (ESTRACE) 1 MG tablet to [facility] on [address].

## 2017-06-08 NOTE — Telephone Encounter (Signed)
Notified pt w/MD response. Pt states the estradiol was originally rx by Dr. Mitzi Hansen Cracker, OBGYN in [place]. He has passed back in Nov 22, 2023. Pt states she had inform Dr. Jenny Reichmann before because he had also rx her thyroid medication, but dr. Jenny Reichmann started refilling since he passed. Pt is asking if script can [facility] to [facility]...Johny Chess

## 2017-06-08 NOTE — Telephone Encounter (Signed)
Pls advise no history of refills on file...Joan Townsend

## 2017-06-08 NOTE — Telephone Encounter (Signed)
Ok done erx 

## 2017-06-09 NOTE — Telephone Encounter (Signed)
Notified pt MD sent rx to [facility]../lmb 

## 2017-06-14 DIAGNOSIS — J324 Chronic pansinusitis: Secondary | ICD-10-CM | POA: Diagnosis not present

## 2017-06-14 DIAGNOSIS — H9313 Tinnitus, bilateral: Secondary | ICD-10-CM | POA: Diagnosis not present

## 2017-06-14 DIAGNOSIS — R05 Cough: Secondary | ICD-10-CM | POA: Diagnosis not present

## 2017-07-20 ENCOUNTER — Ambulatory Visit: Payer: PPO | Admitting: *Deleted

## 2017-07-25 DIAGNOSIS — J329 Chronic sinusitis, unspecified: Secondary | ICD-10-CM | POA: Diagnosis not present

## 2017-07-25 DIAGNOSIS — J342 Deviated nasal septum: Secondary | ICD-10-CM | POA: Diagnosis not present

## 2017-07-25 DIAGNOSIS — H9313 Tinnitus, bilateral: Secondary | ICD-10-CM | POA: Diagnosis not present

## 2017-07-25 DIAGNOSIS — J3089 Other allergic rhinitis: Secondary | ICD-10-CM | POA: Diagnosis not present

## 2017-08-03 ENCOUNTER — Ambulatory Visit (INDEPENDENT_AMBULATORY_CARE_PROVIDER_SITE_OTHER): Payer: Self-pay | Admitting: *Deleted

## 2017-08-03 DIAGNOSIS — I8393 Asymptomatic varicose veins of bilateral lower extremities: Secondary | ICD-10-CM

## 2017-08-03 NOTE — Progress Notes (Signed)
X=.3% Sotradecol administered with a 27g butterfly.  Patient received a total of 12cc.  Treated all areas of concern with two syringes. Easy access. Tol well. Anticipate good results. Follow prn.      Compression stockings applied: Yes.

## 2017-08-04 ENCOUNTER — Encounter: Payer: Self-pay | Admitting: *Deleted

## 2017-08-23 ENCOUNTER — Ambulatory Visit (INDEPENDENT_AMBULATORY_CARE_PROVIDER_SITE_OTHER): Payer: PPO | Admitting: Internal Medicine

## 2017-08-23 ENCOUNTER — Other Ambulatory Visit (INDEPENDENT_AMBULATORY_CARE_PROVIDER_SITE_OTHER): Payer: PPO

## 2017-08-23 ENCOUNTER — Other Ambulatory Visit: Payer: Self-pay | Admitting: Internal Medicine

## 2017-08-23 ENCOUNTER — Other Ambulatory Visit: Payer: PPO

## 2017-08-23 VITALS — BP 122/70 | HR 64 | Temp 98.0°F | Wt 168.2 lb

## 2017-08-23 DIAGNOSIS — E2839 Other primary ovarian failure: Secondary | ICD-10-CM | POA: Diagnosis not present

## 2017-08-23 DIAGNOSIS — Z0000 Encounter for general adult medical examination without abnormal findings: Secondary | ICD-10-CM | POA: Diagnosis not present

## 2017-08-23 DIAGNOSIS — R300 Dysuria: Secondary | ICD-10-CM

## 2017-08-23 DIAGNOSIS — Z23 Encounter for immunization: Secondary | ICD-10-CM | POA: Diagnosis not present

## 2017-08-23 DIAGNOSIS — J309 Allergic rhinitis, unspecified: Secondary | ICD-10-CM | POA: Diagnosis not present

## 2017-08-23 LAB — BASIC METABOLIC PANEL
BUN: 17 mg/dL (ref 6–23)
CHLORIDE: 103 meq/L (ref 96–112)
CO2: 29 meq/L (ref 19–32)
Calcium: 9.8 mg/dL (ref 8.4–10.5)
Creatinine, Ser: 1.19 mg/dL (ref 0.40–1.20)
GFR: 46.53 mL/min — ABNORMAL LOW (ref >60.00)
GLUCOSE: 95 mg/dL (ref 70–99)
Potassium: 4.3 mEq/L (ref 3.5–5.1)
Sodium: 141 mEq/L (ref 135–145)

## 2017-08-23 LAB — URINALYSIS, ROUTINE W REFLEX MICROSCOPIC
Bilirubin Urine: NEGATIVE
Ketones, ur: NEGATIVE
Nitrite: NEGATIVE
PH: 6 (ref 5.0–8.0)
TOTAL PROTEIN, URINE-UPE24: NEGATIVE
UROBILINOGEN UA: 0.2 (ref 0.0–1.0)
Urine Glucose: NEGATIVE

## 2017-08-23 LAB — HEPATIC FUNCTION PANEL
ALBUMIN: 4.1 g/dL (ref 3.5–5.2)
ALK PHOS: 82 U/L (ref 39–117)
ALT: 10 U/L (ref 0–35)
AST: 16 U/L (ref 0–37)
BILIRUBIN DIRECT: 0.1 mg/dL (ref 0.0–0.3)
TOTAL PROTEIN: 7.2 g/dL (ref 6.0–8.3)
Total Bilirubin: 0.4 mg/dL (ref 0.2–1.2)

## 2017-08-23 LAB — CBC WITH DIFFERENTIAL/PLATELET
BASOS ABS: 0.1 10*3/uL (ref 0.0–0.1)
Basophils Relative: 1.5 % (ref 0.0–3.0)
EOS ABS: 0.2 10*3/uL (ref 0.0–0.7)
Eosinophils Relative: 2.6 % (ref 0.0–5.0)
HCT: 44.1 % (ref 36.0–46.0)
Hemoglobin: 14.6 g/dL (ref 12.0–15.0)
LYMPHS ABS: 1.7 10*3/uL (ref 0.7–4.0)
Lymphocytes Relative: 22.5 % (ref 12.0–46.0)
MCHC: 33.1 g/dL (ref 30.0–36.0)
MCV: 88.6 fl (ref 78.0–100.0)
MONO ABS: 0.6 10*3/uL (ref 0.1–1.0)
Monocytes Relative: 8 % (ref 3.0–12.0)
NEUTROS ABS: 4.9 10*3/uL (ref 1.4–7.7)
NEUTROS PCT: 65.4 % (ref 43.0–77.0)
PLATELETS: 275 10*3/uL (ref 150.0–400.0)
RBC: 4.97 Mil/uL (ref 3.87–5.11)
RDW: 14.5 % (ref 11.5–15.5)
WBC: 7.6 10*3/uL (ref 4.0–10.5)

## 2017-08-23 LAB — LIPID PANEL
CHOL/HDL RATIO: 3
Cholesterol: 210 mg/dL — ABNORMAL HIGH (ref 0–200)
HDL: 74.7 mg/dL (ref >39.00)
LDL Cholesterol: 114 mg/dL — ABNORMAL HIGH (ref 0–99)
NonHDL: 135.28
TRIGLYCERIDES: 104 mg/dL (ref 0.0–149.0)
VLDL: 20.8 mg/dL (ref 0.0–40.0)

## 2017-08-23 LAB — TSH: TSH: 7.31 u[IU]/mL — AB (ref 0.35–4.50)

## 2017-08-23 MED ORDER — LEVOTHYROXINE SODIUM 137 MCG PO TABS: 137.0000 ug | ORAL_TABLET | Freq: Every day | ORAL | 3 refills | Status: DC

## 2017-08-23 MED ORDER — NITROFURANTOIN MACROCRYSTAL 50 MG PO CAPS: 50.0000 mg | ORAL_CAPSULE | Freq: Two times a day (BID) | ORAL | 0 refills | Status: DC

## 2017-08-23 NOTE — Assessment & Plan Note (Signed)
?   uTI - for urine studies 

## 2017-08-23 NOTE — Patient Instructions (Addendum)
You had the Tetanus Tdap shot today  Please schedule the bone density test before leaving today at the scheduling desk (where you check out)  Please continue all other medications as before, and refills have been done if requested.  Please have the pharmacy call with any other refills you may need.  Please continue your efforts at being more active, low cholesterol diet, and weight control.  You are otherwise up to date with prevention measures today.  Please keep your appointments with your specialists as you may have planned  Please consider Dr Phineas Real at [facility] for GYN routine care  You will be contacted regarding the referral for: Allergy  Please go to the LAB in the Basement (turn left off the elevator) for the tests to be done today  You will be contacted by phone if any changes need to be made immediately.  Otherwise, you will receive a letter about your results with an explanation, but please check with MyChart first.  Please remember to sign up for MyChart if you have not done so, as this will be important to you in the future with finding out test results, communicating by private email, and scheduling acute appointments online when needed.  If you have Medicare related insurance (such as traditional Medicare, Blue [employer] or [employer], or similar), Please make an appointment at the [facility] with Sharee Pimple, the [employer], for your Wellness Visit in this office, which is a benefit with your insurance.  Please return in 1 year for your yearly visit, or sooner if needed, with Lab testing done 3-5 days before

## 2017-08-23 NOTE — Assessment & Plan Note (Signed)
Uncontrolled, for restart meds, refer allergy per pt request

## 2017-08-23 NOTE — Progress Notes (Signed)
Subjective:    Patient ID: Joan Townsend, female    DOB: [birth year], 76 y.o.   MRN: [id number]  HPI Here for wellness and f/u;  Overall doing ok;  Pt denies Chest pain, worsening SOB, DOE, wheezing, orthopnea, PND, worsening LE edema, palpitations, dizziness or syncope.  Pt denies neurological change such as new headache, facial or extremity weakness.  Pt denies polydipsia, polyuria, or low sugar symptoms. Pt states overall good compliance with treatment and medications, good tolerability, and has been trying to follow appropriate diet.  Pt denies worsening depressive symptoms, suicidal ideation or panic. No fever, night sweats, wt loss, loss of appetite, or other constitutional symptoms.  Pt states good ability with ADL's, has low fall risk, home safety reviewed and adequate, no other significant changes in hearing or vision, and only occasionally active with exercise.  Declines flu shot or pna shot, ok for Tdap. 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  No other interval hx except: c/o 2-3  urinary symptoms such with dysuria, frequency, but denies urgency, flank pain, hematuria or n/v, fever, chills.  Does have several wks ongoing nasal allergy symptoms with clearish congestion, itch and sneezing, without fever, pain, ST, cough, swelling or wheezing. Past Medical History:  Diagnosis Date  . Acquired solitary kidney    s/p right nephrectomy  . Endometriosis   . Hemorrhoids   . Hepatitis    result of Scarlet fever  . History of scarlet fever   . Hyperlipidemia 06/29/2012  . Hypothyroidism   . HYPOTHYROIDISM 01/05/2011   Qualifier: Diagnosis of  By: Carlean Purl MD, Dimas Millin    Past Surgical History:  Procedure Laterality Date  . ABDOMINAL HYSTERECTOMY    . APPENDECTOMY    . CHOLECYSTECTOMY  2012  . COLON RESECTION     part of endometriosis surgery  . COLONOSCOPY  2006   hemorrhoids(Dr. Lajoyce Corners)  . HEMORRHOID BANDING  2014  . NEPHRECTOMY     right, due to birth deformity    reports that  she quit smoking about 38 years ago. Her smoking use included Cigarettes. She has never used smokeless tobacco. She reports that she drinks alcohol. She reports that she does not use drugs. family history includes Breast cancer in her unknown relative; Colon cancer in her unknown relative; Heart disease in her maternal grandfather; Ovarian cancer in her unknown relative; Uterine cancer in her mother. Allergies  Allergen Reactions  . Ciprofloxacin Rash    REACTION: rash REACTION: rash  . Penicillins Rash    REACTION: rash REACTION: rash  . Sulfa Antibiotics     Hives  . Sulfacetamide Sodium Hives    Hives  . Cefaclor    Current Outpatient Prescriptions on File Prior to Visit  Medication Sig Dispense Refill  . Ascorbic Acid (VITAMIN C) 1000 MG tablet Take 1,000 mg by mouth daily.    . Cholecalciferol (VITAMIN D-3) 5000 UNITS TABS Take by mouth daily.    . Coenzyme Q10 (CO Q 10) 100 MG CAPS Take 1 capsule by mouth daily.    Marland Kitchen estradiol (ESTRACE) 1 MG tablet Take 1 tablet (1 mg total) by mouth daily. 90 tablet 3  . hydrocortisone (ANUSOL-HC) 2.5 % rectal cream Place 1 application rectally 2 (two) times daily as needed for hemorrhoids or itching. 30 g 2  . hydrocortisone (ANUSOL-HC) 25 MG suppository Place 1 suppository (25 mg total) rectally every 12 (twelve) hours. 12 suppository 0  . Magnesium 100 MG CAPS Take by mouth.    Marland Kitchen  Misc Natural Products (TART CHERRY ADVANCED) CAPS Take 1 capsule by mouth 2 (two) times daily.    [name] 150 MG CAPS Take by mouth.    Marland Kitchen POTASSIUM IODIDE, ANTIDOTE, PO Take by mouth daily.    . TURMERIC PO Take 750 mg by mouth daily.     . vitamin B-12 (CYANOCOBALAMIN) 1000 MCG tablet Take 1,000 mcg by mouth daily.     No current facility-administered medications on file prior to visit.    Review of Systems Constitutional: Negative for other unusual diaphoresis, sweats, appetite or weight changes HENT: Negative for other worsening hearing loss, ear  pain, facial swelling, mouth sores or neck stiffness.   Eyes: Negative for other worsening pain, redness or other visual disturbance.  Respiratory: Negative for other stridor or swelling Cardiovascular: Negative for other palpitations or other chest pain  Gastrointestinal: Negative for worsening diarrhea or loose stools, blood in stool, distention or other pain Genitourinary: Negative for hematuria, flank pain or other change in urine volume.  Musculoskeletal: Negative for myalgias or other joint swelling.  Skin: Negative for other color change, or other wound or worsening drainage.  Neurological: Negative for other syncope or numbness. Hematological: Negative for other adenopathy or swelling Psychiatric/Behavioral: Negative for hallucinations, other worsening agitation, SI, self-injury, or new decreased concentration All other system neg per pt    Objective:   Physical Exam BP 122/70 (BP Location: Left Arm, Patient Position: Sitting, Cuff Size: Normal)   Pulse 64   Temp 98 F (36.7 C) (Oral)   Wt 168 lb 4 oz (76.3 kg)   SpO2 98%   BMI 27.16 kg/m  VS noted,  Constitutional: Pt is oriented to person, place, and time. Appears well-developed and well-nourished, in no significant distress and comfortable Head: Normocephalic and atraumatic  Eyes: Conjunctivae and EOM are normal. Pupils are equal, round, and reactive to light Right Ear: External ear normal without discharge Left Ear: External ear normal without discharge Nose: Nose without discharge or deformity Mouth/Throat: Oropharynx is without other ulcerations and moist  Neck: Normal range of motion. Neck supple. No JVD present. No tracheal deviation present or significant neck LA or mass Cardiovascular: Normal rate, regular rhythm, normal heart sounds and intact distal pulses.   Pulmonary/Chest: WOB normal and breath sounds without rales or wheezing  Abdominal: Soft. Bowel sounds are normal. NT. No HSM  Musculoskeletal: Normal  range of motion. Exhibits no edema Lymphadenopathy: Has no other cervical adenopathy.  Neurological: Pt is alert and oriented to person, place, and time. Pt has normal reflexes. No cranial nerve deficit. Motor grossly intact, Gait intact Skin: Skin is warm and dry. No rash noted or new ulcerations Psychiatric:  Has normal mood and affect. Behavior is normal without agitation No other exam findings    Assessment & Plan:

## 2017-08-23 NOTE — Assessment & Plan Note (Signed)

## 2017-08-24 ENCOUNTER — Ambulatory Visit: Payer: PPO | Admitting: Internal Medicine

## 2017-08-25 LAB — URINE CULTURE
MICRO NUMBER:: 81215127
SPECIMEN QUALITY: ADEQUATE

## 2017-08-31 ENCOUNTER — Ambulatory Visit (INDEPENDENT_AMBULATORY_CARE_PROVIDER_SITE_OTHER)
Admission: RE | Admit: 2017-08-31 | Discharge: 2017-08-31 | Disposition: A | Discharge status: Home / Self Care | Payer: PPO | Source: Ambulatory Visit | Attending: Internal Medicine | Admitting: Internal Medicine

## 2017-08-31 DIAGNOSIS — E2839 Other primary ovarian failure: Secondary | ICD-10-CM

## 2017-10-13 ENCOUNTER — Encounter: Payer: Self-pay | Admitting: Allergy & Immunology

## 2017-10-13 ENCOUNTER — Ambulatory Visit (INDEPENDENT_AMBULATORY_CARE_PROVIDER_SITE_OTHER): Payer: PPO | Admitting: Allergy & Immunology

## 2017-10-13 VITALS — BP 136/96 | HR 78 | Ht 66.0 in | Wt 166.0 lb

## 2017-10-13 DIAGNOSIS — J310 Chronic rhinitis: Secondary | ICD-10-CM

## 2017-10-13 DIAGNOSIS — R059 Cough, unspecified: Secondary | ICD-10-CM

## 2017-10-13 DIAGNOSIS — R05 Cough: Secondary | ICD-10-CM | POA: Diagnosis not present

## 2017-10-13 DIAGNOSIS — Z889 Allergy status to unspecified drugs, medicaments and biological substances status: Secondary | ICD-10-CM

## 2017-10-13 DIAGNOSIS — J329 Chronic sinusitis, unspecified: Secondary | ICD-10-CM | POA: Insufficient documentation

## 2017-10-13 NOTE — Patient Instructions (Addendum)
1. Chronic rhinitis - We will get some blood work to look for allergies. - We will call you in 1-2 weeks with the results. - Continue with nasal saline rinses.   2. Return in about 3 months (around 01/11/2018).   Please inform us of any Emergency Department visits, hospitalizations, or changes in symptoms. Call us before going to the ED for breathing or allergy symptoms since we might be able to fit you in for a sick visit. Feel free to contact us anytime with any questions, problems, or concerns.  It was a pleasure to meet you today! Enjoy the holiday season!  Websites that have reliable patient information: 1. American Academy of Asthma, Allergy, and Immunology: www.aaaai.org 2. Food Allergy Research and Education (FARE): foodallergy.org 3. Mothers of Asthmatics: http://www.asthmacommunitynetwork.org 4. American College of Allergy, Asthma, and Immunology: www.acaai.org

## 2017-10-13 NOTE — Progress Notes (Signed)
NEW PATIENT  Date of Service/Encounter:  10/13/17  Referring provider: Biagio Borg, MD   Assessment:   Chronic rhinitis  Cough  Multiple antibiotic allergies   Plan/Recommendations:   1. Chronic rhinitis - We will get some blood work to look for allergies. - We will call in 1-2 weeks with the results. - Continue with nasal saline rinses.  - I did offer samples of other nasal sprays, but she was not interested in pursuing this at all.   2. Cough - did not cough at all during the visit - Spirometry was not normal at all today, but she deferred on a further workup today. - She has not history of confirmed wheezing or need for nebulizer treatments. - She was not interested in working this up at all today.  - We will defer further workup at this time.   3. Multiple antibiotic allergies - with some likely intolerances rather than allergies - We did not have time to address this much today since she was in such a hurry. - I did add clindamycin to her list of allergies, given the swelling of her face with exposure to that medication. - We can delve deeper into this when she follows up.   4. Return in about 3 months (around 01/11/2018).   Subjective:   Joan Townsend is a 76 y.o. female presenting today for evaluation of  Chief Complaint  Patient presents with  . Allergy Testing    Pt presents for allergy testing. Pt has had a cough after taking her Tetanus shot approximately 2 months ago    Joan Townsend has a history of the following: Patient Active Problem List   Diagnosis Date Noted  . Chronic rhinitis 10/13/2017  . Dysuria 08/23/2017  . UTI (urinary tract infection) 04/08/2017  . Spider veins of both lower extremities 09/13/2016  . Other bursal cyst, right shoulder 07/26/2016  . 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 Meniscal cyst 06/21/2016  . Allergic rhinitis 06/10/2016  . Pain due to varicose veins of  lower extremity 06/10/2016  . Bunion of great toe of right foot 04/23/2016  . Facial lesion 04/15/2016  . Right leg pain 04/15/2016  . Right knee pain 04/15/2016  . Palpitations 09/05/2014  . Essential hypertension 09/05/2014  . Hypersomnia 06/05/2014  . Prolapsed internal hemorrhoids, grade 2 07/18/2013  . Bladder pain 03/12/2013  . Abdominal pain, other specified site 03/12/2013  . Chest pain 06/29/2012  . Hyperlipidemia 06/29/2012  . Acquired solitary kidney   . Preventative health care 06/28/2012  . Hypothyroidism 01/05/2011    History obtained from: chart review and patient.  Joan Townsend was referred by Biagio Borg, MD.     Joan Townsend is a 76 y.o. female presenting for an asthma and allergy evaluation and to "shut her PCP up". She got her Tetanus shot which seems to have triggered the coughing. Coughing has continued since that time. She never had a problem with coughing prior to that. Overall it has calmed down some since it started. She does report that she was wheezing, although she has never needed a nebulizer. She treated with a diffuser with oils, which seems to have helped. However, she is also here for allergy testing which is what she seems most intent on focusing.    She does endorse thick sinus drainage for six months or more. Prior to this, she never had problems with this. She  will occasionally have issues prior to this, but it definitely worsened over the last six months. She was referred to an ENT for the drainage. She was diagnosed with an infection and started on clindamycin. Unfortunately, she seems to have not tolerated this medication at all, developing swelling of her face. Review of the notes by Dr. Janace Hoard reveals that she has a deviated septum and he noted only some thickening within the ethmoid sinuses. He also mentions a "spur" in his note. Results below:   IMPRESSION: 1.No active sinusitis or sinus obstruction. 2.  Bilateral maxillary infundibular narrowing  from infraorbital septations/pneumatization. 3.  Narrower right middle meatus due to rightward septal deviation and paradoxical turbinate rotation.  She evidently has tried some type of nasal steroid, but overall she is steroid phobic. She does use a nasal saline rinse with some improvement in her symptoms. She is not interested in samples and actually tells me that she does not have time for testing today since she has to be back at work "seven minutes ago".   She has a history of multiple antibiotic allergies, including most recently the clindamycin. She has gotten cow's milk out of her diet, although she is not allergic to it. She feels that this has made her mucous less viscous. Otherwise, there is no history of other atopic diseases, including asthma, drug allergies, stinging insect allergies, or urticaria. There is no significant infectious history. Vaccinations are up to date.    Past Medical History: Patient Active Problem List   Diagnosis Date Noted  . Chronic rhinitis 10/13/2017  . Dysuria 08/23/2017  . UTI (urinary tract infection) 04/08/2017  . Spider veins of both lower extremities 09/13/2016  . Other bursal cyst, right shoulder 07/26/2016  . 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 Meniscal cyst 06/21/2016  . Allergic rhinitis 06/10/2016  . 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 Bunion of great toe of right foot 04/23/2016  . Facial lesion 04/15/2016  . Right leg pain 04/15/2016  . Right knee pain 04/15/2016  . Palpitations 09/05/2014  . Essential hypertension 09/05/2014  . Hypersomnia 06/05/2014  . Prolapsed internal hemorrhoids, grade 2 07/18/2013  . Bladder pain 03/12/2013  . Abdominal pain, other specified site 03/12/2013  . Chest pain 06/29/2012  . Hyperlipidemia 06/29/2012  . Acquired solitary kidney   . Preventative health care 06/28/2012  . Hypothyroidism 01/05/2011    Medication  List:  Allergies as of 10/13/2017      Reactions   Ciprofloxacin Rash   REACTION: rash REACTION: rash   Clindamycin/lincomycin Anaphylaxis   Penicillins Rash   REACTION: rash REACTION: rash   Sulfa Antibiotics    Hives   Sulfacetamide Sodium Hives   Hives   Cefaclor       Medication List        Accurate as of 10/13/17  5:05 PM. Always use your most recent med list.          Co Q 10 100 MG Caps Take 1 capsule by mouth daily.   estradiol 1 MG tablet Commonly known as:  ESTRACE Take 1 tablet (1 mg total) by mouth daily.   levothyroxine 137 MCG tablet Commonly known as:  SYNTHROID, LEVOTHROID Take 1 tablet (137 mcg total) by mouth daily before breakfast.   Magnesium 100 MG Caps Take by mouth.   nitrofurantoin 50 MG capsule Commonly known as:  MACRODANTIN Take 1 capsule (50 mg total) by  mouth 2 (two) times daily.   Olive Leaf Extract 150 MG Caps Take by mouth.   POTASSIUM IODIDE (ANTIDOTE) PO Take by mouth daily.   TART CHERRY ADVANCED Caps Take 1 capsule by mouth 2 (two) times daily.   TURMERIC PO Take 750 mg by mouth daily.   vitamin B-12 1000 MCG tablet Commonly known as:  CYANOCOBALAMIN Take 1,000 mcg by mouth daily.   vitamin C 1000 MG tablet Take 1,000 mg by mouth daily.   Vitamin D-3 5000 units Tabs Take by mouth daily.       Birth History: non-contributory.   Developmental History: non-contributory.   Past Surgical History: Past Surgical History:  Procedure Laterality Date  . ABDOMINAL HYSTERECTOMY    . APPENDECTOMY    . CHOLECYSTECTOMY  2012  . COLON RESECTION     part of endometriosis surgery  . COLONOSCOPY  2006   hemorrhoids(Dr. Lajoyce Corners)  . HEMORRHOID BANDING  2014  . NEPHRECTOMY     right, due to birth deformity     Family History: Family History  Problem Relation Age of Onset  . Breast cancer Unknown        great aunts x2, cousin  . Ovarian cancer Unknown        aunt  . Uterine cancer Mother   . Colon cancer Unknown         great aunts x3  . Heart disease Maternal Grandfather      Social History: Chayla lives at home by herself. She moved to [place] in 1983, living in [place] prior to this. She lives by herself, as her husband died 10 years ago. This was her second marriage, each of which lasted 74 years. She has a 8yo daughter who is around fairly often. 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     Review of Systems: a 14-point review of systems is pertinent for what is mentioned in HPI.  Otherwise, all other systems were negative. Constitutional: negative other than that listed in the HPI Eyes: negative other than that listed in the HPI Ears, nose, mouth, throat, and face: negative other than that listed in the HPI Respiratory: negative other than that listed in the HPI Cardiovascular: negative other than that listed in the HPI Gastrointestinal: negative other than that listed in the HPI Genitourinary: negative other than that listed in the HPI Integument: negative other than that listed in the HPI Hematologic: negative other than that listed in the HPI Musculoskeletal: negative other than that listed in the HPI Neurological: negative other than that listed in the HPI Allergy/Immunologic: negative other than that listed in the HPI    Objective:   Blood pressure (!) 136/96, pulse 78, height 5\' 6"  (1.676 m), weight 166 lb (75.3 kg), SpO2 96 %. Body mass index is 26.79 kg/m.   Physical Exam:  General: Alert, interactive, in no acute distress. Talkative but somewhat terse at times.  Eyes: No conjunctival injection bilaterally, no discharge on the right, no discharge on the left and no Horner-Trantas dots present. PERRL bilaterally. EOMI without pain. No photophobia.  Ears: Right TM pearly gray with normal light reflex, Left TM pearly gray with normal light reflex, Right TM intact without perforation and Left TM intact without perforation.  Nose/Throat: External nose within normal  limits and septum midline. Deviated septum appreciated. No polyps. Turbinates edematous without discharge. Posterior oropharynx mildly erythematous without cobblestoning in the posterior oropharynx. Tonsils 2+ without exudates.  Tongue without thrush. Neck: Supple without thyromegaly. Trachea  midline. Adenopathy: no enlarged lymph nodes appreciated in the anterior cervical, occipital, axillary, epitrochlear, inguinal, or popliteal regions. Lungs: Clear to auscultation without wheezing, rhonchi or rales. No increased work of breathing. CV: Normal S1/S2. No murmurs. Capillary refill <2 seconds.  Abdomen: Nondistended, nontender. No guarding or rebound tenderness. Bowel sounds faint and hypoactive  Skin: Warm and dry, without lesions or rashes. Extremities:  No clubbing, cyanosis or edema. Neuro:   Grossly intact. No focal deficits appreciated. Responsive to questions.  Diagnostic studies:   Spirometry: results abnormal (FEV1: 1.64/59%, FVC: 2.19/61%, FEV1/FVC: 75%).    Spirometry consistent with possible restrictive disease.   Allergy Studies: refused because she did not have time     Salvatore Marvel, MD [facility]

## 2017-10-16 LAB — IGE+ALLERGENS ZONE 2(30)
Amer Sycamore IgE Qn: <0.1 kU/L
Cladosporium Herbarum IgE: <0.1 kU/L
Cockroach, American IgE: <0.1 kU/L
D Farinae IgE: <0.1 kU/L
D Pteronyssinus IgE: <0.1 kU/L
Dog Dander IgE: <0.1 kU/L
Hickory, White IgE: <0.1 kU/L
IGE (IMMUNOGLOBULIN E), SERUM: 52 [IU]/mL (ref 0–100)
Maple/Box Elder IgE: <0.1 kU/L
Mucor Racemosus IgE: <0.1 kU/L
Mugwort IgE Qn: <0.1 kU/L
Nettle IgE: <0.1 kU/L
Oak, White IgE: <0.1 kU/L
Penicillium Chrysogen IgE: <0.1 kU/L
Plantain, English IgE: <0.1 kU/L
Sheep Sorrel IgE Qn: <0.1 kU/L
Sweet gum IgE RAST Ql: <0.1 kU/L

## 2017-11-05 ENCOUNTER — Ambulatory Visit (INDEPENDENT_AMBULATORY_CARE_PROVIDER_SITE_OTHER): Payer: PPO | Admitting: Internal Medicine

## 2017-11-05 ENCOUNTER — Encounter: Payer: Self-pay | Admitting: Internal Medicine

## 2017-11-05 ENCOUNTER — Ambulatory Visit: Payer: PPO | Admitting: Internal Medicine

## 2017-11-05 VITALS — BP 130/82 | HR 73 | Temp 98.1°F | Resp 14 | Ht 66.0 in | Wt 173.0 lb

## 2017-11-05 DIAGNOSIS — M25572 Pain in left ankle and joints of left foot: Secondary | ICD-10-CM

## 2017-11-05 DIAGNOSIS — M25472 Effusion, left ankle: Secondary | ICD-10-CM

## 2017-11-05 MED ORDER — NAPROXEN 375 MG PO TABS: 375.0000 mg | ORAL_TABLET | Freq: Two times a day (BID) | ORAL | 0 refills | Status: DC

## 2017-11-05 NOTE — Patient Instructions (Signed)
Naproxen and naproxen sodium oral immediate-release tablets What is this medicine? NAPROXEN (na PROX en) is a non-steroidal anti-inflammatory drug (NSAID). It is used to reduce swelling and to treat pain. This medicine may be used for dental pain, headache, or painful monthly periods. It is also used for painful joint and muscular problems such as arthritis, tendinitis, bursitis, and gout. This medicine may be used for other purposes; ask your health care provider or pharmacist if you have questions. COMMON BRAND NAME(S): Aflaxen, Aleve, Aleve Arthritis, All Day Relief, Anaprox, Anaprox DS, Naprosyn, [facility] Naproxen Sodium What should I tell my health care provider before I take this medicine? They need to know if you have any of these conditions: -asthma -cigarette smoker -drink more than 3 alcohol containing drinks a day -heart disease or circulation problems such as heart failure or leg edema (fluid retention) -high blood pressure -kidney disease -liver disease -stomach bleeding or ulcers -an unusual or allergic reaction to naproxen, aspirin, other NSAIDs, other medicines, foods, dyes, or preservatives -pregnant or trying to get pregnant -breast-feeding How should I use this medicine? Take this medicine by mouth with a glass of water. Follow the directions on the prescription label. Take it with food if your stomach gets upset. Try to not lie down for at least 10 minutes after you take it. Take your medicine at regular intervals. Do not take your medicine more often than directed. Long-term, continuous use may increase the risk of heart attack or stroke. A special MedGuide will be given to you by the pharmacist with each prescription and refill. Be sure to read this information carefully each time. Talk to your pediatrician regarding the use of this medicine in children. Special care may be needed. Overdosage: If you think you have taken too much of this medicine contact a poison control  center or emergency room at once. NOTE: This medicine is only for you. Do not share this medicine with others. What if I miss a dose? If you miss a dose, take it as soon as you can. If it is almost time for your next dose, take only that dose. Do not take double or extra doses. What may interact with this medicine? -alcohol -aspirin -cidofovir -diuretics -lithium -methotrexate -other drugs for inflammation like ketorolac or prednisone -pemetrexed -probenecid -warfarin This list may not describe all possible interactions. Give your health care provider a list of all the medicines, herbs, non-prescription drugs, or dietary supplements you use. Also tell them if you smoke, drink alcohol, or use illegal drugs. Some items may interact with your medicine. What should I watch for while using this medicine? Tell your doctor or health care professional if your pain does not get better. Talk to your doctor before taking another medicine for pain. Do not treat yourself. This medicine does not prevent heart attack or stroke. In fact, this medicine may increase the chance of a heart attack or stroke. The chance may increase with longer use of this medicine and in people who have heart disease. If you take aspirin to prevent heart attack or stroke, talk with your doctor or health care professional. Do not take other medicines that contain aspirin, ibuprofen, or naproxen with this medicine. Side effects such as stomach upset, nausea, or ulcers may be more likely to occur. Many medicines available without a prescription should not be taken with this medicine. This medicine can cause ulcers and bleeding in the stomach and intestines at any time during treatment. Do not smoke cigarettes or drink   alcohol. These increase irritation to your stomach and can make it more susceptible to damage from this medicine. Ulcers and bleeding can happen without warning symptoms and can cause death. You may get drowsy or dizzy.  Do not drive, use machinery, or do anything that needs mental alertness until you know how this medicine affects you. Do not stand or sit up quickly, especially if you are an older patient. This reduces the risk of dizzy or fainting spells. This medicine can cause you to bleed more easily. Try to avoid damage to your teeth and gums when you brush or floss your teeth. What side effects may I notice from receiving this medicine? Side effects that you should report to your doctor or health care professional as soon as possible: -black or bloody stools, blood in the urine or vomit -blurred vision -chest pain -difficulty breathing or wheezing -nausea or vomiting -severe stomach pain -skin rash, skin redness, blistering or peeling skin, hives, or itching -slurred speech or weakness on one side of the body -swelling of eyelids, throat, lips -unexplained weight gain or swelling -unusually weak or tired -yellowing of eyes or skin Side effects that usually do not require medical attention (report to your doctor or health care professional if they continue or are bothersome): -constipation -headache -heartburn This list may not describe all possible side effects. Call your doctor for medical advice about side effects. You may report side effects to FDA at 1-800-FDA-1088. Where should I keep my medicine? Keep out of the reach of children. Store at room temperature between 15 and 30 degrees C (59 and 86 degrees F). Keep container tightly closed. Throw away any unused medicine after the expiration date. NOTE: This sheet is a summary. It may not cover all possible information. If you have questions about this medicine, talk to your doctor, pharmacist, or health care provider.  2018 Elsevier/Gold Standard (2009-10-13 20:10:16)  

## 2017-11-05 NOTE — Progress Notes (Signed)
Subjective:    Patient ID: Joan Townsend, female    DOB: [birth year], 77 y.o.   MRN: [id number]  HPI  Pt presents to the clinic today with c/o left ankle pain and swelling. She reports this started yesterday. She describes the pain as sore and achy. The pain does not radiate. She denies numbness or tingling in the foot. The pain does seem worse with ambulation. She denies any injury to the area. She elevated her leg yesterday, but has not taken anything OTC for her symptoms.   Review of Systems      Past Medical History:  Diagnosis Date  . Acquired solitary kidney    s/p right nephrectomy  . Endometriosis   . Hemorrhoids   . Hepatitis    result of Scarlet fever  . History of scarlet fever   . Hyperlipidemia 06/29/2012  . Hypothyroidism   . HYPOTHYROIDISM 01/05/2011   Qualifier: Diagnosis of  By: Carlean Purl MD, Dimas Millin     Current Outpatient Medications  Medication Sig Dispense Refill  . Ascorbic Acid (VITAMIN C) 1000 MG tablet Take 1,000 mg by mouth daily.    . Cholecalciferol (VITAMIN D-3) 5000 UNITS TABS Take by mouth daily.    . Coenzyme Q10 (CO Q 10) 100 MG CAPS Take 1 capsule by mouth daily.    Marland Kitchen estradiol (ESTRACE) 1 MG tablet Take 1 tablet (1 mg total) by mouth daily. 90 tablet 3  . levothyroxine (SYNTHROID, LEVOTHROID) 137 MCG tablet Take 1 tablet (137 mcg total) by mouth daily before breakfast. 90 tablet 3  . Magnesium 100 MG CAPS Take by mouth.    . Misc Natural Products (TART CHERRY ADVANCED) CAPS Take 1 capsule by mouth 2 (two) times daily.    . nitrofurantoin (MACRODANTIN) 50 MG capsule Take 1 capsule (50 mg total) by mouth 2 (two) times daily. 20 capsule 0  . Olive Leaf Extract 150 MG CAPS Take by mouth.    Marland Kitchen POTASSIUM IODIDE, ANTIDOTE, PO Take by mouth daily.    . TURMERIC PO Take 750 mg by mouth daily.     . vitamin B-12 (CYANOCOBALAMIN) 1000 MCG tablet Take 1,000 mcg by mouth daily.     No current facility-administered medications for this visit.      Allergies  Allergen Reactions  . Ciprofloxacin Rash    REACTION: rash REACTION: rash  . Clindamycin/Lincomycin Anaphylaxis  . Penicillins Rash    REACTION: rash REACTION: rash  . Sulfa Antibiotics     Hives  . Sulfacetamide Sodium Hives    Hives  . Cefaclor     Family History  Problem Relation Age of Onset  . Breast cancer Unknown        great aunts x2, cousin  . Ovarian cancer Unknown        aunt  . Uterine cancer Mother   . Colon cancer Unknown        great aunts x3  . Heart disease Maternal Grandfather     Social History   Socioeconomic History  . Marital status: Widowed    Spouse name: Not on file  . Number of children: Not on file  . Years of education: Not on file  . Highest education level: Not on file  Social Needs  . Financial resource strain: Not on file  . Food insecurity - worry: Not on file  . Food insecurity - inability: Not on file  . Transportation needs - medical: Not on file  . Transportation needs -  non-medical: Not on file  Occupational History  . Occupation: retired    Fish farm manager: RETIRED  Tobacco Use  . Smoking status: Former Smoker    Types: Cigarettes    Last attempt to quit: 10/25/1978    Years since quitting: 39.0  . Smokeless tobacco: Never Used  Substance and Sexual Activity  . Alcohol use: Yes    Comment: occasional   . Drug use: No  . Sexual activity: Not on file  Other Topics Concern  . Not on file  Social History Narrative  . Not on file     Constitutional: Denies fever, malaise, fatigue, headache or abrupt weight changes.  Musculoskeletal: Pt reports left ankle pain and swelling. Denies decrease in range of motion, difficulty with gait, muscle pain .  Skin: Denies redness, rashes, lesions or ulcercations.    No other specific complaints in a complete review of systems (except as listed in HPI above).  Objective:   Physical Exam   BP 130/82 (BP Location: Left Arm, Patient Position: Sitting, Cuff Size: Normal)    Pulse 73   Temp 98.1 F (36.7 C) (Oral)   Resp 14   Ht 5\' 6"  (1.676 m)   Wt 173 lb (78.5 kg)   SpO2 98%   BMI 27.92 kg/m  Wt Readings from Last 3 Encounters:  11/05/17 173 lb (78.5 kg)  10/13/17 166 lb (75.3 kg)  08/23/17 168 lb 4 oz (76.3 kg)    General: Appears her stated age, in NAD. Skin: Discoloration noted over the lateral malleolus and dorsal surface of foot, but no redness or warmth.  Musculoskeletal: Normal flexion, extension and rotation of the left ankle. Swelling noted over the lateral malleolus. Pain with palpation over the lateral malleolus. No difficulty with gait.   BMET    Component Value Date/Time   NA 141 08/23/2017 1222   K 4.3 08/23/2017 1222   CL 103 08/23/2017 1222   CO2 29 08/23/2017 1222   GLUCOSE 95 08/23/2017 1222   BUN 17 08/23/2017 1222   CREATININE 1.19 08/23/2017 1222   CALCIUM 9.8 08/23/2017 1222    Lipid Panel     Component Value Date/Time   CHOL 210 (H) 08/23/2017 1222   TRIG 104.0 08/23/2017 1222   HDL 74.70 08/23/2017 1222   CHOLHDL 3 08/23/2017 1222   VLDL 20.8 08/23/2017 1222   LDLCALC 114 (H) 08/23/2017 1222    CBC    Component Value Date/Time   WBC 7.6 08/23/2017 1222   RBC 4.97 08/23/2017 1222   HGB 14.6 08/23/2017 1222   HCT 44.1 08/23/2017 1222   PLT 275.0 08/23/2017 1222   MCV 88.6 08/23/2017 1222   MCHC 33.1 08/23/2017 1222   RDW 14.5 08/23/2017 1222   LYMPHSABS 1.7 08/23/2017 1222   MONOABS 0.6 08/23/2017 1222   EOSABS 0.2 08/23/2017 1222   BASOSABS 0.1 08/23/2017 1222    Hgb A1C No results found for: HGBA1C        Assessment & Plan:   Left Ankle Pain and Swelling:  Will try Naproxen 375 mg BID x 7 days- take with food Encouraged elevation Discussed compression if she she is gong to be doing a lot of standing/walking  Follow up with PCP if no improvement in pain or swelling , , NP

## 2017-11-05 NOTE — Progress Notes (Signed)
Pre visit review using our clinic review tool, if applicable. No additional management support is needed unless otherwise documented below in the visit note. 

## 2017-11-08 ENCOUNTER — Ambulatory Visit: Payer: Self-pay | Admitting: *Deleted

## 2017-11-08 NOTE — Telephone Encounter (Signed)
FYI

## 2017-11-08 NOTE — Telephone Encounter (Signed)
Patient is calling with possible reaction to medication. She was given naproxen at last office visit. She is having lip and mouth swelling for 2 days. She reports she has not eaten out or changed anything at home. She is going to stop the naproxen and start benadryl. She is on her way to work and she was only available for an appointment on Thursday morning.  Reason for Disposition . Lip swelling lasts > 3 days  Answer Assessment - Initial Assessment Questions 1. ONSET: "When did the swelling start?" (e.g., minutes, hours, days)     Yesterday- patient reports she is having lip and mouth swelling 2. SEVERITY: "How swollen is it?"     Patient reports she looks like she has had "bad botox" 3. ITCHING: "Is there any itching?" If so, ask: "How much?"   (Scale 1-10; mild, moderate or severe)     No itch reported 4. PAIN: "Is the swelling painful to touch?" If so, ask: "How painful is it?"   (Scale 1-10; mild, moderate or severe)     No pain reported 5. CAUSE: "What do you think is causing the lip swelling?"     Patient states she was given naproxen to treat Gout symptoms- that is the only thing that she has changed- no change in diet or foods. 6. RECURRENT SYMPTOM: "Have you had lip swelling before?" If so, ask: "When was the last time?" "What happened that time?"     Yes- patient reports she has this reaction to many medications- she thinks she is allergic to the naproxin 7. OTHER SYMPTOMS: "Do you have any other symptoms?" (e.g., toothache)     No other symptoms- no problems breathing 8. PREGNANCY: "Is there any chance you are pregnant?" "When was your last menstrual period?"     n/a  Protocols used: [facility]

## 2017-11-10 ENCOUNTER — Telehealth: Payer: Self-pay

## 2017-11-10 ENCOUNTER — Encounter: Payer: Self-pay | Admitting: Internal Medicine

## 2017-11-10 ENCOUNTER — Ambulatory Visit (INDEPENDENT_AMBULATORY_CARE_PROVIDER_SITE_OTHER): Payer: PPO | Admitting: Internal Medicine

## 2017-11-10 ENCOUNTER — Other Ambulatory Visit: Payer: Self-pay | Admitting: Internal Medicine

## 2017-11-10 ENCOUNTER — Other Ambulatory Visit (INDEPENDENT_AMBULATORY_CARE_PROVIDER_SITE_OTHER): Payer: PPO

## 2017-11-10 VITALS — BP 144/84 | HR 74 | Temp 97.8°F | Ht 66.0 in | Wt 175.0 lb

## 2017-11-10 DIAGNOSIS — N289 Disorder of kidney and ureter, unspecified: Secondary | ICD-10-CM

## 2017-11-10 DIAGNOSIS — N183 Chronic kidney disease, stage 3 unspecified: Secondary | ICD-10-CM | POA: Insufficient documentation

## 2017-11-10 DIAGNOSIS — E039 Hypothyroidism, unspecified: Secondary | ICD-10-CM

## 2017-11-10 DIAGNOSIS — M25472 Effusion, left ankle: Secondary | ICD-10-CM

## 2017-11-10 DIAGNOSIS — J309 Allergic rhinitis, unspecified: Secondary | ICD-10-CM

## 2017-11-10 DIAGNOSIS — T7840XA Allergy, unspecified, initial encounter: Secondary | ICD-10-CM

## 2017-11-10 LAB — URINALYSIS, ROUTINE W REFLEX MICROSCOPIC
BILIRUBIN URINE: NEGATIVE
KETONES UR: NEGATIVE
LEUKOCYTES UA: NEGATIVE
NITRITE: NEGATIVE
Specific Gravity, Urine: 1.01 (ref 1.000–1.030)
TOTAL PROTEIN, URINE-UPE24: NEGATIVE
Urine Glucose: NEGATIVE
Urobilinogen, UA: 0.2 (ref 0.0–1.0)
pH: 6 (ref 5.0–8.0)

## 2017-11-10 LAB — BASIC METABOLIC PANEL
BUN: 17 mg/dL (ref 6–23)
CALCIUM: 9.3 mg/dL (ref 8.4–10.5)
CO2: 30 mEq/L (ref 19–32)
Chloride: 106 mEq/L (ref 96–112)
Creatinine, Ser: 1.06 mg/dL (ref 0.40–1.20)
GFR: 53.15 mL/min — AB (ref >60.00)
Glucose, Bld: 93 mg/dL (ref 70–99)
POTASSIUM: 4.9 meq/L (ref 3.5–5.1)
SODIUM: 142 meq/L (ref 135–145)

## 2017-11-10 LAB — T4, FREE: FREE T4: 1.45 ng/dL (ref 0.60–1.60)

## 2017-11-10 LAB — TSH: TSH: 0.17 u[IU]/mL — ABNORMAL LOW (ref 0.35–4.50)

## 2017-11-10 MED ORDER — LEVOTHYROXINE SODIUM 125 MCG PO TABS
125.0000 ug | ORAL_TABLET | Freq: Every day | ORAL | 3 refills | Status: DC
Start: 2017-11-10 — End: 2020-08-11

## 2017-11-10 MED ORDER — METHYLPREDNISOLONE ACETATE 80 MG/ML IJ SUSP
80.0000 mg | Freq: Once | INTRAMUSCULAR | Status: AC
  Administered 2017-11-10: 80 mg via INTRAMUSCULAR

## 2017-11-10 MED ORDER — METHYLPREDNISOLONE 4 MG PO TBPK: ORAL_TABLET | ORAL | 0 refills | Status: DC

## 2017-11-10 NOTE — Patient Instructions (Addendum)
You had the steroid shot today  Please take all new medication as prescribed - the steroid pak  Please continue all other medications as before, and refills have been done if requested.  Please have the pharmacy call with any other refills you may need.  Please keep your appointments with your specialists as you may have planned  Please go to the LAB in the Basement (turn left off the elevator) for the tests to be done today  You will be contacted by phone if any changes need to be made immediately.  Otherwise, you will receive a letter about your results with an explanation, but please check with MyChart first.  Please remember to sign up for MyChart if you have not done so, as this will be important to you in the future with finding out test results, communicating by private email, and scheduling acute appointments online when needed.

## 2017-11-10 NOTE — Progress Notes (Signed)
Subjective:    Patient ID: Joan Townsend, female    DOB: 1941-10-25, 77 y.o.   MRN: [id number]  HPI  Here to f/u after seen recently with left lateral ankle tarsal area swelling and mild discomfort and yellow green healing bruise area, though she is adamant there was no trauma or turned ankle, and was told she has gout at last visit.  Was tx with naproxyn and tart cherry, but after a few doses of naproxyn she c/o facial swelling and itching and points to her face like it is obvious but to me with little to no residual swelling. Has taken benadryl abd tart cherry was taken after the swelling started.  No other known med or food allergy.  Has hx of Sinus symptoms, having seen ENT for chronic sinusitis, initially tx with clindamycin but CT neg for infection per pt report, and had similar facial allergy swelling symptoms with clindamycin.  Also referred to allergy recently with neg allergy blood testing.  Also gained 11 lbs after recent thyroid med change.  Is quite keen to blame her wt change and ankle and maybe even facial swelling on her thyroid med change.  Very argumentative, angry and frustrated demeanor today trying to parse her symptom timing to some kind, none of which seem to make sense. Wt Readings from Last 3 Encounters:  11/10/17 175 lb (79.4 kg)  11/05/17 173 lb (78.5 kg)  10/13/17 166 lb (75.3 kg)   Past Medical History:  Diagnosis Date  . Acquired solitary kidney    s/p right nephrectomy  . Endometriosis   . Hemorrhoids   . Hepatitis    result of Scarlet fever  . History of scarlet fever   . Hyperlipidemia 06/29/2012  . Hypothyroidism   . HYPOTHYROIDISM 01/05/2011   Qualifier: Diagnosis of  By: Carlean Purl MD, Dimas Millin    Past Surgical History:  Procedure Laterality Date  . ABDOMINAL HYSTERECTOMY    . APPENDECTOMY    . CHOLECYSTECTOMY  2012  . COLON RESECTION     part of endometriosis surgery  . COLONOSCOPY  2006   hemorrhoids(Dr. Lajoyce Corners)  . HEMORRHOID BANDING  2014  .  NEPHRECTOMY     right, due to birth deformity    reports that she quit smoking about 39 years ago. Her smoking use included cigarettes. she has never used smokeless tobacco. She reports that she drinks alcohol. She reports that she does not use drugs. family history includes Breast cancer in her unknown relative; Colon cancer in her unknown relative; Heart disease in her maternal grandfather; Ovarian cancer in her unknown relative; Uterine cancer in her mother. Allergies  Allergen Reactions  . Ciprofloxacin Rash    REACTION: rash REACTION: rash  . Clindamycin/Lincomycin Anaphylaxis  . Penicillins Rash    REACTION: rash REACTION: rash  . Sulfa Antibiotics     Hives  . Sulfacetamide Sodium Hives    Hives  . Cefaclor   . Naproxen Other (See Comments)    Facial swelling   Current Outpatient Medications on File Prior to Visit  Medication Sig Dispense Refill  . Ascorbic Acid (VITAMIN C) 1000 MG tablet Take 1,000 mg by mouth daily.    . Cholecalciferol (VITAMIN D-3) 5000 UNITS TABS Take by mouth daily.    . Coenzyme Q10 (CO Q 10) 100 MG CAPS Take 1 capsule by mouth daily.    Marland Kitchen estradiol (ESTRACE) 1 MG tablet Take 1 tablet (1 mg total) by mouth daily. 90 tablet 3  .  Magnesium 100 MG CAPS Take by mouth.    . Misc Natural Products (TART CHERRY ADVANCED) CAPS Take 1 capsule by mouth 2 (two) times daily.    . naproxen (NAPROSYN) 375 MG tablet Take 1 tablet (375 mg total) by mouth 2 (two) times daily with a meal. 14 tablet 0  . Olive Leaf Extract 150 MG CAPS Take by mouth.    Marland Kitchen POTASSIUM IODIDE, ANTIDOTE, PO Take by mouth daily.    . vitamin B-12 (CYANOCOBALAMIN) 1000 MCG tablet Take 1,000 mcg by mouth daily.     No current facility-administered medications on file prior to visit.    Review of Systems  Constitutional: Negative for other unusual diaphoresis or sweats HENT: Negative for ear discharge or swelling Eyes: Negative for other worsening visual disturbances Respiratory: Negative  for stridor or other swelling  Gastrointestinal: Negative for worsening distension or other blood Genitourinary: Negative for retention or other urinary change Musculoskeletal: Negative for other MSK pain or swelling Skin: Negative for color change or other new lesions Neurological: Negative for worsening tremors and other numbness  Psychiatric/Behavioral: Negative for worsening agitation or other fatigue All other system neg per pt    Objective:   Physical Exam BP (!) 144/84   Pulse 74   Temp 97.8 F (36.6 C) (Oral)   Ht 5\' 6"  (1.676 m)   Wt 175 lb (79.4 kg)   SpO2 98%   BMI 28.25 kg/m  VS noted, not ill appaering Constitutional: Pt appears in NAD HENT: Head: NCAT.  Right Ear: External ear normal.  Left Ear: External ear normal.  Eyes: . Pupils are equal, round, and reactive to light. Conjunctivae and EOM are normal I am unable to appreciate facial sweling at this time, sinus nontender Nose: without d/c or deformity Neck: Neck supple. Gross normal ROM Cardiovascular: Normal rate and regular rhythm.   Pulmonary/Chest: Effort normal and breath sounds without rales or wheezing.  Abd:  Soft, NT, ND, + BS, no organomegaly Neurological: Pt is alert. At baseline orientation, motor grossly intact Skin: Skin is warm. No rashes, other new lesions, no LE edema except for trace swelling and nontender yellow greenish bruising like area 1 x 2 x cm area at left lateral tarsal tunnell Psychiatric: Pt behavior is normal without agitation  Angry stressed frustrated, not at all satisfied with exam today     Assessment & Plan:

## 2017-11-10 NOTE — Telephone Encounter (Signed)
Pt has been informed and expressed understanding.  

## 2017-11-10 NOTE — Telephone Encounter (Signed)
-----   Message from Biagio Borg, MD sent at 11/10/2017 12:28 PM EST ----- Left message on MyChart, pt to cont same tx except  The test results show that your current treatment is OK, except the thyroid testing does show the dose of the current medication is slightly too high.  We need to reduce the thyroid medication from 127 to 125 mcg per day, and this can be recheked at your next visit.Redmond Baseman to please inform pt, I will do rx, and add labs for pt to do in 4 wks if she likes

## 2017-11-11 ENCOUNTER — Telehealth: Payer: Self-pay | Admitting: Internal Medicine

## 2017-11-11 NOTE — Telephone Encounter (Signed)
I really cant prove her symptoms one way or another, but it was apparent from the blood tests that she was on slightly too high dose.  I have sent a new rx for the new dose. Thanks

## 2017-11-11 NOTE — Telephone Encounter (Signed)
Pt stated that the gave her a higher dose levothyroxine than was prescribed but she wanted to know would her low thyroid condition be causing her to feel bad like how she has been for the last 2 months. Please advise.

## 2017-11-11 NOTE — Telephone Encounter (Signed)
Copied from [place] [id number] [id number]. Topic: Quick Communication - See Telephone Encounter >> Nov 11, 2017 10:02 AM Ahmed Prima L wrote: CRM for notification. See Telephone encounter for:   11/11/17.  Patient wants a nurse to call back again about the lab results. She is having some confusion about a medicine that was on the [place].  Call back is [phone]

## 2017-11-11 NOTE — Telephone Encounter (Addendum)
Pt has been informed however she can not comprehend the answer given by Dr. Jenny Reichmann. After explaining to her that the medication dose she was on was too high and causing her thyroid numbers to be low and out of range she still could not understand that her symptoms that she discuss during her OV of high blood pressure and heart rate issues could not be confirmed nor denied to be caused by her thyroid. She would like for Dr. Jenny Reichmann to call her personally but I informed patient that he was in clinic and more than likely he would request that she come back in for an OV to further discuss. She then stated that she has an appt in 30 days and if Dr. Jenny Reichmann doesn't know what causes her symptoms then she would go ask another doctor and hung up on me.   Dr. Jenny Reichmann should I forward this to office manager? Please advise.

## 2017-11-12 NOTE — Assessment & Plan Note (Signed)
Also for f/u today, stable overall by history and exam, recent data reviewed with pt, and pt to continue medical treatment as before,  to f/u any worsening symptoms or concerns

## 2017-11-12 NOTE — Assessment & Plan Note (Signed)
Not clear today if gout related, has healing bruise area but she is adamant it cant be a bruise as she recalls no trauma, wants to blame it on her thyroid med change or something else

## 2017-11-12 NOTE — Assessment & Plan Note (Addendum)
mild, c/o bitterly her facial swelling which I am unable to appreciate facial swelling today, to certainly stop the nsaid and place on allergy list, pt is demanding some type of tx, suspect actual issue likely out if proportion to her concern, for depomedrol 80 IM, predpac asd which may help left ankle as well?  to f/u any worsening symptoms or concerns

## 2017-11-12 NOTE — Assessment & Plan Note (Addendum)
With recent increased to 137 qd, for recheck, doubt relates to her wt gain,  to f/u any worsening symptoms or concerns

## 2018-02-24 ENCOUNTER — Telehealth: Payer: Self-pay

## 2018-02-24 NOTE — Telephone Encounter (Signed)
Follow up lab are already ordered. Physical labs due later in 2019.  Copied from [place]. Topic: Quick Communication - See Telephone Encounter >> Feb 24, 2018 11:04 AM Antonieta Iba C wrote: CRM for notification. See Telephone encounter for: 02/24/18.  Pt is coming in to have her AMW visit and would like to have lab orders placed for missing labs. Pt says that she was suppose to have came in before to complete labs, pt would like to have them while she's at the office.

## 2018-02-28 NOTE — Telephone Encounter (Signed)
Dr. Jenny Reichmann I do not see any labs ordered for this patient but for a TSH and T4, free that has expired. Should those be the only labs drawn or all of the CPE labs? Please advise.

## 2018-02-28 NOTE — Telephone Encounter (Signed)
All labs are already ordered

## 2018-02-28 NOTE — Telephone Encounter (Signed)
Patient would also like a urinalysis put in for when she comes in for her labs.

## 2018-02-28 NOTE — Telephone Encounter (Signed)
Sorry I've over looked them.  Patient has been informed.

## 2018-03-08 ENCOUNTER — Ambulatory Visit: Payer: PPO

## 2018-03-13 ENCOUNTER — Telehealth: Payer: Self-pay | Admitting: Internal Medicine

## 2018-03-13 NOTE — Telephone Encounter (Signed)
Call pt June 5th to reschedule AWV.

## 2018-04-03 NOTE — Telephone Encounter (Signed)
Patient will call back to reschedule.

## 2018-04-14 ENCOUNTER — Other Ambulatory Visit (INDEPENDENT_AMBULATORY_CARE_PROVIDER_SITE_OTHER): Payer: PPO

## 2018-04-14 ENCOUNTER — Telehealth: Payer: Self-pay

## 2018-04-14 DIAGNOSIS — E039 Hypothyroidism, unspecified: Secondary | ICD-10-CM | POA: Diagnosis not present

## 2018-04-14 DIAGNOSIS — Z0000 Encounter for general adult medical examination without abnormal findings: Secondary | ICD-10-CM | POA: Diagnosis not present

## 2018-04-14 LAB — URINALYSIS, ROUTINE W REFLEX MICROSCOPIC
Bilirubin Urine: NEGATIVE
KETONES UR: NEGATIVE
Leukocytes, UA: NEGATIVE
Nitrite: NEGATIVE
PH: 6 (ref 5.0–8.0)
Total Protein, Urine: NEGATIVE
UROBILINOGEN UA: 0.2 (ref 0.0–1.0)
Urine Glucose: NEGATIVE

## 2018-04-14 LAB — TSH: TSH: 4.79 u[IU]/mL — AB (ref 0.35–4.50)

## 2018-04-14 LAB — T4, FREE: Free T4: 0.88 ng/dL (ref 0.60–1.60)

## 2018-04-14 NOTE — Telephone Encounter (Signed)
TSH order expired New order entered

## 2018-05-10 DIAGNOSIS — Z1231 Encounter for screening mammogram for malignant neoplasm of breast: Secondary | ICD-10-CM | POA: Diagnosis not present

## 2018-05-10 DIAGNOSIS — Z01419 Encounter for gynecological examination (general) (routine) without abnormal findings: Secondary | ICD-10-CM | POA: Diagnosis not present

## 2018-06-08 ENCOUNTER — Encounter: Payer: Self-pay | Admitting: Internal Medicine

## 2018-06-08 ENCOUNTER — Ambulatory Visit (INDEPENDENT_AMBULATORY_CARE_PROVIDER_SITE_OTHER): Payer: PPO | Admitting: Internal Medicine

## 2018-06-08 DIAGNOSIS — J321 Chronic frontal sinusitis: Secondary | ICD-10-CM | POA: Diagnosis not present

## 2018-06-08 MED ORDER — DOXYCYCLINE HYCLATE 100 MG PO TABS: 100.0000 mg | ORAL_TABLET | Freq: Two times a day (BID) | ORAL | 0 refills | Status: DC

## 2018-06-08 MED ORDER — DOXYCYCLINE HYCLATE 100 MG PO TABS
100.0000 mg | ORAL_TABLET | Freq: Two times a day (BID) | ORAL | 0 refills | Status: DC
Start: 2018-06-08 — End: 2018-06-08

## 2018-06-08 NOTE — Patient Instructions (Signed)
We have sent in the doxycyline to take 1 pill twice a day for 10 days.

## 2018-06-08 NOTE — Assessment & Plan Note (Signed)
10 day course doxycycline. She does have many allergies to antibiotics. If reaction would do extended azithromycin as she has tolerated this in the past.

## 2018-06-08 NOTE — Progress Notes (Signed)
   Subjective:    Patient ID: Joan Townsend, female    DOB: [birth year], 77 y.o.   MRN: [id number]  HPI The patient is a 77 YO female coming in for possible sinus infection. She had x-rays done at the dentist about 2 weeks ago and told she had a bad infection. They gave her a z-pack which helped temporarily She has been feeling poorly off and on for about 1 year. Denies fevers or chills. Denies SOB. She has been to many people about her sinuses. She has taken allegra from ENT and given an antibiotic. This caused her swelling and she stopped taking. She then went to allergist and was told to take allegra and z-pack. She is still having sinus pressure and pain. She is coughing up green sputum.   Review of Systems  Constitutional: Positive for activity change and fatigue. Negative for appetite change, chills, fever and unexpected weight change.  HENT: Positive for congestion, ear pain, postnasal drip, rhinorrhea, sinus pressure, sore throat and voice change. Negative for ear discharge, sinus pain, sneezing, tinnitus and trouble swallowing.        Tooth pain  Eyes: Negative.   Respiratory: Positive for cough. Negative for chest tightness, shortness of breath and wheezing.   Cardiovascular: Negative.   Gastrointestinal: Negative.   Musculoskeletal: Positive for myalgias.  Neurological: Negative.       Objective:   Physical Exam  Constitutional: She is oriented to person, place, and time. She appears well-developed and well-nourished.  HENT:  Head: Normocephalic and atraumatic.  Oropharynx with redness and clear drainage, nose with swollen turbinates, TM normal right, TM bulging clear fluid left, frontal sinus tenderness bilaterally  Eyes: EOM are normal.  Neck: Normal range of motion. No thyromegaly present.  Cardiovascular: Normal rate and regular rhythm.  Pulmonary/Chest: Effort normal and breath sounds normal. No respiratory distress. She has no wheezes. She has no rales.  Abdominal: Soft.    Lymphadenopathy:    She has no cervical adenopathy.  Neurological: She is alert and oriented to person, place, and time.  Skin: Skin is warm and dry.   Vitals:   06/08/18 1503  BP: (!) 144/80  Pulse: 72  Temp: 97.9 F (36.6 C)  TempSrc: Oral  SpO2: 97%  Weight: 175 lb (79.4 kg)  Height: 5\' 6"  (1.676 m)      Assessment & Plan:

## 2018-06-21 DIAGNOSIS — Z90710 Acquired absence of both cervix and uterus: Secondary | ICD-10-CM | POA: Diagnosis not present

## 2018-06-21 DIAGNOSIS — E039 Hypothyroidism, unspecified: Secondary | ICD-10-CM | POA: Diagnosis not present

## 2018-06-22 ENCOUNTER — Encounter: Payer: Self-pay | Admitting: Internal Medicine

## 2018-06-22 ENCOUNTER — Ambulatory Visit (INDEPENDENT_AMBULATORY_CARE_PROVIDER_SITE_OTHER): Payer: PPO | Admitting: Internal Medicine

## 2018-06-22 DIAGNOSIS — J321 Chronic frontal sinusitis: Secondary | ICD-10-CM

## 2018-06-22 MED ORDER — MONTELUKAST SODIUM 10 MG PO TABS
10.0000 mg | ORAL_TABLET | Freq: Every day | ORAL | 3 refills | Status: DC
Start: 2018-06-22 — End: 2018-08-10

## 2018-06-22 NOTE — Progress Notes (Signed)
   Subjective:    Patient ID: Joan Townsend, female    DOB: [birth year], 77 y.o.   MRN: [id number]  HPI The patient is a 77 YO female coming in with sinus problems. She did just see [place] about 2 weeks ago for possible sinus infection and was given doxycycline. She has taken this and denies any improvement. When asked she is having less pressure in her sinuses. She is still having drainage and coughing up some green stuff. Denies SOB and the cough feels like from the drainage. She denies fevers or chills. Some lessening of the ear pressure although she still has some. She is not taking anything for allergies or drainage. She is not taking anything right now for this. She is wanting to know why this happens. She has tried claritin in the past and zyrtec for a day or two and did not decreased drainage but states that since we do not know what is going on in there that this is just a band aid.   Review of Systems  Constitutional: Positive for activity change and fatigue. Negative for appetite change, chills, fever and unexpected weight change.  HENT: Positive for congestion, ear pain, postnasal drip, rhinorrhea and sinus pressure. Negative for ear discharge, sinus pain, sneezing, sore throat, tinnitus, trouble swallowing and voice change.   Eyes: Negative.   Respiratory: Positive for cough. Negative for chest tightness, shortness of breath and wheezing.   Cardiovascular: Negative.   Gastrointestinal: Negative.   Musculoskeletal: Negative.   Neurological: Negative.       Objective:   Physical Exam  Constitutional: She is oriented to person, place, and time. She appears well-developed and well-nourished.  HENT:  Head: Normocephalic and atraumatic.  TMs without fluid, oropharynx with redness and drainage, no crusting in the nose but redness and swelling in the turbinates.   Eyes: EOM are normal.  Neck: Normal range of motion.  Cardiovascular: Normal rate and regular rhythm.  Pulmonary/Chest: Effort  normal and breath sounds normal. No respiratory distress. She has no wheezes. She has no rales.  Abdominal: Soft. Bowel sounds are normal. She exhibits no distension. There is no tenderness. There is no rebound.  Musculoskeletal: She exhibits no edema.  Neurological: She is alert and oriented to person, place, and time. Coordination normal.  Skin: Skin is warm and dry.   Vitals:   06/22/18 1538  BP: (!) 150/78  Pulse: 62  Temp: 97.7 F (36.5 C)  TempSrc: Oral  SpO2: 94%  Weight: 176 lb (79.8 kg)  Height: 5\' 6"  (1.676 m)      Assessment & Plan:  Visit time 25 minutes: greater than 50% of that time was spent in face to face counseling and coordination of care with the patient: counseled about the nature of sinus drainage and mucus production and that she likely does not have infection anymore but needs medication to help decrease production so that she does not get accumulation and lack of indication for sinus infection or antibiotic today.

## 2018-06-22 NOTE — Patient Instructions (Signed)
We have sent in singulair to take 1 pill daily for 1-2 weeks then let us know how you are doing.

## 2018-06-23 ENCOUNTER — Telehealth: Payer: Self-pay | Admitting: Internal Medicine

## 2018-06-23 NOTE — Assessment & Plan Note (Signed)
Do not suspect infection at this time but rather just increase mucus amount. She is asked to continue zyrtec and sent in rx for singulair to help with production. Talked to her about sinus rinse.

## 2018-06-23 NOTE — Telephone Encounter (Unsigned)
Copied from [place] [id number]. Topic: Quick Communication - See Telephone Encounter >> Jun 23, 2018  9:54 AM Marja Kays F wrote: Pt is wanting it documented in her chart that the pharmacy is trying to say that the doxycycline was called in as a refill on 06/14/18 but we see nothing in the chart and patient is thinking about changing pharmacie

## 2018-07-18 ENCOUNTER — Ambulatory Visit: Payer: PPO | Admitting: Internal Medicine

## 2018-07-25 DIAGNOSIS — L304 Erythema intertrigo: Secondary | ICD-10-CM | POA: Diagnosis not present

## 2018-08-09 ENCOUNTER — Ambulatory Visit: Payer: PPO | Admitting: Family

## 2018-08-09 ENCOUNTER — Telehealth: Payer: Self-pay | Admitting: Emergency Medicine

## 2018-08-09 NOTE — Telephone Encounter (Signed)
Pt came in for an appt with Jodi Mourning for congestion, sinus issues and not feeling well. Pt checked in at 1:52 for a 2:00 appt. Pt was handed a mask due to symptoms. At 2:18 the patient came up to the desk and stated we were taking to long and she was on her lunch break so she was going to need to leave without being seen. I called CMA who was unavailable to answer the phone. I let patient know that I would walk back to the back and speak with the CMA to see how much longer it would be. Pt stated it was suppose to be an appt to discuss something very important but not to worry about it anymore. Geni Bers was at the front desk and asked since its something important is it something she could help her with. She stated I have this spot and I saw the dermatologist (which is something the patient was not down to be seen for). Patient stopped mid sentence and said you know what you all just dont worry about it. She then threw her mask on the desk at [place] and slammed the door open and walked out. Pt called [facility] afterwards and rescheduled appt for 10/17 at 9:00 am with a different provider.

## 2018-08-10 ENCOUNTER — Encounter: Payer: Self-pay | Admitting: Family Medicine

## 2018-08-10 ENCOUNTER — Ambulatory Visit (INDEPENDENT_AMBULATORY_CARE_PROVIDER_SITE_OTHER): Payer: PPO | Admitting: Family Medicine

## 2018-08-10 ENCOUNTER — Ambulatory Visit (INDEPENDENT_AMBULATORY_CARE_PROVIDER_SITE_OTHER)
Admission: RE | Admit: 2018-08-10 | Discharge: 2018-08-10 | Disposition: A | Discharge status: Home / Self Care | Payer: PPO | Source: Ambulatory Visit | Attending: Family Medicine | Admitting: Family Medicine

## 2018-08-10 VITALS — BP 152/82 | HR 77 | Temp 98.2°F | Ht 66.0 in | Wt 175.0 lb

## 2018-08-10 DIAGNOSIS — R05 Cough: Secondary | ICD-10-CM

## 2018-08-10 DIAGNOSIS — R21 Rash and other nonspecific skin eruption: Secondary | ICD-10-CM

## 2018-08-10 DIAGNOSIS — J329 Chronic sinusitis, unspecified: Secondary | ICD-10-CM

## 2018-08-10 DIAGNOSIS — R059 Cough, unspecified: Secondary | ICD-10-CM

## 2018-08-10 NOTE — Patient Instructions (Signed)
Please obtain your X-ray before you leave. We will contact you with the results within one week or sooner if needed.  Follow up with your allergy provider.  You can continue Zyrtec for symptoms and nasal sinus rinses.  We also discussed symptoms of reflux and food triggers. See information below.   Food Choices for Gastroesophageal Reflux Disease, Adult When you have gastroesophageal reflux disease (GERD), the foods you eat and your eating habits are very important. Choosing the right foods can help ease your discomfort. What guidelines do I need to follow?  Choose fruits, vegetables, whole grains, and low-fat dairy products.  Choose low-fat meat, fish, and poultry.  Limit fats such as oils, salad dressings, butter, nuts, and avocado.  Keep a food diary. This helps you identify foods that cause symptoms.  Avoid foods that cause symptoms. These may be different for everyone.  Eat small meals often instead of 3 large meals a day.  Eat your meals slowly, in a place where you are relaxed.  Limit fried foods.  Cook foods using methods other than frying.  Avoid drinking alcohol.  Avoid drinking large amounts of liquids with your meals.  Avoid bending over or lying down until 2-3 hours after eating. What foods are not recommended? These are some foods and drinks that may make your symptoms worse: Vegetables Tomatoes. Tomato juice. Tomato and spaghetti sauce. Chili peppers. Onion and garlic. Horseradish. Fruits Oranges, grapefruit, and lemon (fruit and juice). Meats High-fat meats, fish, and poultry. This includes hot dogs, ribs, ham, sausage, salami, and bacon. Dairy Whole milk and chocolate milk. Sour cream. Cream. Butter. Ice cream. Cream cheese. Drinks Coffee and tea. Bubbly (carbonated) drinks or energy drinks. Condiments Hot sauce. Barbecue sauce. Sweets/Desserts Chocolate and cocoa. Donuts. Peppermint and spearmint. Fats and Oils High-fat foods. This includes  [place] fries and potato chips. Other Vinegar. Strong spices. This includes black pepper, white pepper, red pepper, cayenne, curry powder, cloves, ginger, and chili powder. The items listed above may not be a complete list of foods and drinks to avoid. Contact your dietitian for more information. This information is not intended to replace advice given to you by your health care provider. Make sure you discuss any questions you have with your health care provider. Document Released: 04/11/2012 Document Revised: 03/18/2016 Document Reviewed: 08/15/2013 Elsevier Interactive Patient Education  2017 [employer].

## 2018-08-10 NOTE — Progress Notes (Signed)
Subjective:    Patient ID: Joan Townsend, female    DOB: 07-10-1941, 77 y.o.   MRN: [id number]  HPI  [name] is a 77 year old female who presents today with cough and increased mucous production that has been present for "some time." She reports that she has a history of chronic sinusitis and reports that while sinus pressure is present it is not as a severe.  She denies SOB, but endorses occasional wheezing. She states that she does feel at times that cough may be coming from sinus drainage.   Onset: Unclear from patient report however she endorses greater than one month Trigger: Unknown Course: Cough does not interfere with sleep. She reports having mucous production daily. She was treated for possible sinus infection with doxycycline which she completed. She reports that sinus pressure improved but cough remained.   Treatment/efficacy: Singulair was provided at visit in August 2019 and patient is not longer taking. Does not believe this was of benefit to her.   Associated symptoms: Fever/Chills/Sweats: No Facial Pain: No Frontal Headaches: No Nasal congestion/purulence: No Dental pain: No Sore throat: No Ear pain/pressure: No Itchy/watery eyes: Watery eyes Cough: Yes Pleuritic pain: No Dyspnea: No Wheezing: occasional wheezing; not present today.   ACE Inhibitor: No History of asthma/bronchitis/COPD: No Recent sick contact exposure: No Recent antibiotic ZLD:JTTSVX 2019 GI symptoms of dyspepsia, dysphagia, or reflux:No  Reports being evaluated by an allergist and ENT. She states that her blood work at the allergy clinic did not indicate any obvious allergies.   She denies symptoms of reflux today and also denies triggers such as spicy food, alcohol, nicotine, NSAIDs, peppermint, and soda intake. She reports drinking coffee in the morning. She has not taken anything for reflux before.  She reports seeing a dermatologist for a rash that was noted on her lower abdomen. She was  prescribed some "creams" that she has used for 3 weeks and symptom has improved and nearly resolved.    Review of Systems  HENT: Positive for postnasal drip, sinus pressure and sneezing. Negative for congestion, ear pain, sinus pain, sore throat and trouble swallowing.   Eyes: Negative for discharge, redness and itching.  Respiratory: Positive for cough. Negative for shortness of breath and wheezing.   Cardiovascular: Negative for chest pain and palpitations.  Gastrointestinal: Negative for abdominal pain, constipation and vomiting.  Musculoskeletal: Negative for myalgias.  Skin: Positive for rash.  Neurological: Negative for dizziness.   Past Medical History:  Diagnosis Date  . Acquired solitary kidney    s/p right nephrectomy  . Endometriosis   . Hemorrhoids   . Hepatitis    result of Scarlet fever  . History of scarlet fever   . Hyperlipidemia 06/29/2012  . Hypothyroidism   . HYPOTHYROIDISM 01/05/2011   Qualifier: Diagnosis of  By: Carlean Purl MD, Dimas Millin      Social History   Socioeconomic History  . Marital status: Widowed    Spouse name: Not on file  . Number of children: Not on file  . Years of education: Not on file  . Highest education level: Not on file  Occupational History  . Occupation: retired    Fish farm manager: RETIRED  Social Needs  . Financial resource strain: Not on file  . Food insecurity:    Worry: Not on file    Inability: Not on file  . Transportation needs:    Medical: Not on file    Non-medical: Not on file  Tobacco Use  .  Smoking status: Former Smoker    Types: Cigarettes    Last attempt to quit: 10/25/1978    Years since quitting: 39.8  . Smokeless tobacco: Never Used  Substance and Sexual Activity  . Alcohol use: Yes    Comment: occasional   . Drug use: No  . Sexual activity: Not on file  Lifestyle  . Physical activity:    Days per week: Not on file    Minutes per session: Not on file  . Stress: Not on file  Relationships  . Social  connections:    Talks on phone: Not on file    Gets together: Not on file    Attends religious service: Not on file    Active member of club or organization: Not on file    Attends meetings of clubs or organizations: Not on file    Relationship status: Not on file  . Intimate partner violence:    Fear of current or ex partner: Not on file    Emotionally abused: Not on file    Physically abused: Not on file    Forced sexual activity: Not on file  Other Topics Concern  . Not on file  Social History Narrative  . Not on file    Past Surgical History:  Procedure Laterality Date  . ABDOMINAL HYSTERECTOMY    . APPENDECTOMY    . CHOLECYSTECTOMY  2012  . COLON RESECTION     part of endometriosis surgery  . COLONOSCOPY  2006   hemorrhoids(Dr. Lajoyce Corners)  . HEMORRHOID BANDING  2014  . NEPHRECTOMY     right, due to birth deformity    Family History  Problem Relation Age of Onset  . Breast cancer Unknown        great aunts x2, cousin  . Ovarian cancer Unknown        aunt  . Uterine cancer Mother   . Colon cancer Unknown        great aunts x3  . Heart disease Maternal Grandfather     Allergies  Allergen Reactions  . Ciprofloxacin Rash    REACTION: rash REACTION: rash  . Clindamycin/Lincomycin Anaphylaxis  . Penicillins Rash    REACTION: rash REACTION: rash  . Sulfa Antibiotics     Hives  . Sulfacetamide Sodium Hives    Hives  . Cefaclor   . Naproxen Other (See Comments)    Facial swelling    Current Outpatient Medications on File Prior to Visit  Medication Sig Dispense Refill  . Ascorbic Acid (VITAMIN C) 1000 MG tablet Take 1,000 mg by mouth daily.    . Cholecalciferol (VITAMIN D-3) 5000 UNITS TABS Take by mouth daily.    . Coenzyme Q10 (CO Q 10) 100 MG CAPS Take 1 capsule by mouth daily.    Marland Kitchen estradiol (ESTRACE) 1 MG tablet Take 1 tablet (1 mg total) by mouth daily. 90 tablet 3  . fluticasone (CUTIVATE) 0.05 % cream APPLY ON THE SKIN TWICE A DAY AS NEEDED AS NEEDED  USE 1 WEEK ON/OFF AS NEEDED  2  . ketoconazole (NIZORAL) 2 % cream APPLY ON THE SKIN TWICE A DAY AS NEEDED  2  . levothyroxine (SYNTHROID, LEVOTHROID) 125 MCG tablet Take 1 tablet (125 mcg total) by mouth daily. 90 tablet 3  . Magnesium 100 MG CAPS Take by mouth.    . Misc Natural Products (TART CHERRY ADVANCED) CAPS Take 1 capsule by mouth 2 (two) times daily.    [name] 150 MG CAPS  Take by mouth.    Marland Kitchen POTASSIUM IODIDE, ANTIDOTE, PO Take by mouth daily.    . vitamin B-12 (CYANOCOBALAMIN) 1000 MCG tablet Take 1,000 mcg by mouth daily.     No current facility-administered medications on file prior to visit.     BP (!) 152/82 (BP Location: Left Arm, Patient Position: Sitting, Cuff Size: Normal)   Pulse 77   Temp 98.2 F (36.8 C) (Oral)   Ht 5\' 6"  (1.676 m)   Wt 175 lb (79.4 kg)   SpO2 96%   BMI 28.25 kg/m        Objective:   Physical Exam  Constitutional: She is oriented to person, place, and time. She appears well-developed and well-nourished.  HENT:  Mouth/Throat: Oropharynx is clear and moist.  Eyes: Pupils are equal, round, and reactive to light. No scleral icterus.  Neck: Neck supple.  Cardiovascular: Normal rate and regular rhythm.  Pulmonary/Chest: Effort normal and breath sounds normal. She has no wheezes. She has no rales.  Abdominal: Soft. Bowel sounds are normal.  Lymphadenopathy:    She has no cervical adenopathy.  Neurological: She is alert and oriented to person, place, and time.  Skin: Skin is warm. Capillary refill takes less than 2 seconds.  Mild erythema located under skin fold approximately 1.0 cm in length  on lower abdomen present. No drainage present. No edema or drainage present.   Psychiatric: She has a normal mood and affect. Her behavior is normal. Judgment and thought content normal.       Assessment & Plan:  1. Cough Cough greater than one month present. While cough does not interfere with sleep and is mildly productive, she is  interested in further investigation today. She states that she wants to "get to the bottom of this"  Lung exam benign today. RR: 14, O2 sat 96%.  Suspect that symptom is related to post nasal drainage. We discussed recommendations from allergy provider that included further work up due to spirometry that was not normal. She was unclear about next follow up which was due in March 2019. She is not interested in further treatment but has agreed to see allergist. Advised that she follow up with allergy provider as recommended. - DG Chest 2 View; Future  2. Chronic sinusitis, unspecified location Sinus pressure improved, she has completed doxycycline. No obvious signs of infection today. We discussed that mucous production may also be related to allergies and she endorses being around her daughter's cat. Advised avoidance of allergens, continue Zyrtec, nasal saline rinses, and follow up with allergy provider as recommended. She declines flonase stating she experienced nose bleeds when using this previously. She declines follow up with ENT. She reports that imaging of her sinuses did not reveal an infection. She cannot state timeline of when imaging was completed and imaging is not available today.   3. Rash Improving and nearly resolved. She will continue treatment from dermatology as recommended. She is unable to state name of medication. No other rash present. Advised follow up with dermatology provider as recommended.  We discussed reasons for cough including allergies, asthma, reflux, and bacterial/viral infections. She did not cough during exam and appeared well overall. Will obtain chest X-ray today due to duration of symptoms, patient's concerns, and further advised her to follow up with allergy provider and ENT as recommended. She voiced understanding and agreed with plan.  I spent 25 minutes with this patient and greater than 50% of the time was spent in face to face counseling  regarding reasons for  cough, triggers to avoid related to allergies and acid reflux, discussing recommendations from allergist that patient stated she did not realize follow up was recommended and she will do so today.  Further counseling related to sinus drainage, mucous production, and reasons for antibiotic use. Advised close follow up if she has symptoms of fever, shortness of breath, wheezing, or new symptoms.   Delano Metz, FNP-C

## 2018-08-17 DIAGNOSIS — L304 Erythema intertrigo: Secondary | ICD-10-CM | POA: Diagnosis not present

## 2018-08-24 ENCOUNTER — Encounter: Payer: Self-pay | Admitting: Allergy & Immunology

## 2018-08-24 ENCOUNTER — Ambulatory Visit (INDEPENDENT_AMBULATORY_CARE_PROVIDER_SITE_OTHER): Payer: PPO | Admitting: Allergy & Immunology

## 2018-08-24 VITALS — BP 140/70 | HR 79 | Temp 98.1°F | Resp 18 | Ht 64.0 in | Wt 178.4 lb

## 2018-08-24 DIAGNOSIS — J3089 Other allergic rhinitis: Secondary | ICD-10-CM

## 2018-08-24 DIAGNOSIS — R059 Cough, unspecified: Secondary | ICD-10-CM

## 2018-08-24 DIAGNOSIS — J302 Other seasonal allergic rhinitis: Secondary | ICD-10-CM | POA: Diagnosis not present

## 2018-08-24 DIAGNOSIS — R05 Cough: Secondary | ICD-10-CM

## 2018-08-24 MED ORDER — AZELASTINE HCL 0.1 % NA SOLN: NASAL | 5 refills | Status: DC

## 2018-08-24 MED ORDER — FLUTICASONE PROPIONATE 50 MCG/ACT NA SUSP: NASAL | 5 refills | Status: DC

## 2018-08-24 NOTE — Patient Instructions (Addendum)
1. Chronic rhinitis - Testing today showed: grasses, indoor molds, outdoor molds, cat and cockroach - Avoidance measures provided. - Start taking: Claritin (loratadine) 10mg  tablet once daily and Flonase (fluticasone) one spray per nostril on Mon/Wed/Fri and Astelin (azelastine) one spray per nostril on Sun/Tues/Thurs/Sat - You can use an extra dose of the antihistamine, if needed, for breakthrough symptoms.  - Consider nasal saline rinses 1-2 times daily to remove allergens from the nasal cavities as well as help with mucous clearance (this is especially helpful to do before the nasal sprays are given) - Consider allergy shots as a means of long-term control. - Allergy shots "re-train" and "reset" the immune system to ignore environmental allergens and decrease the resulting immune response to those allergens (sneezing, itchy watery eyes, runny nose, nasal congestion, etc).    - Allergy shots improve symptoms in 75-85% of patients.   2. Cough - Spirometry (breathing test) was normal. - This does not sound like asthma at all.  - However, we could do full pulmonary function testing in the future if needed, but I would like to give the new medications some time to work.   3. Return in about 3 months (around 11/24/2018).   Please inform us of any Emergency Department visits, hospitalizations, or changes in symptoms. Call us before going to the ED for breathing or allergy symptoms since we might be able to fit you in for a sick visit. Feel free to contact us anytime with any questions, problems, or concerns.  It was a pleasure to see you again today!  Websites that have reliable patient information: 1. American Academy of Asthma, Allergy, and Immunology: www.aaaai.org 2. Food Allergy Research and Education (FARE): foodallergy.org 3. Mothers of Asthmatics: http://www.asthmacommunitynetwork.org 4. American College of Allergy, Asthma, and Immunology: MonthlyElectricBill.co.uk   Make sure you are registered  to vote! If you have moved or changed any of your contact information, you will need to get this updated before voting!     Reducing Pollen Exposure  The American Academy of Allergy, Asthma and Immunology suggests the following steps to reduce your exposure to pollen during allergy seasons.    1. Do not hang sheets or clothing out to dry; pollen may collect on these items. 2. Do not mow lawns or spend time around freshly cut grass; mowing stirs up pollen. 3. Keep windows closed at night.  Keep car windows closed while driving. 4. Minimize morning activities outdoors, a time when pollen counts are usually at their highest. 5. Stay indoors as much as possible when pollen counts or humidity is high and on windy days when pollen tends to remain in the air longer. 6. Use air conditioning when possible.  Many air conditioners have filters that trap the pollen spores. 7. Use a HEPA room air filter to remove pollen form the indoor air you breathe.  Control of Mold Allergen   Mold and fungi can grow on a variety of surfaces provided certain temperature and moisture conditions exist.  Outdoor molds grow on plants, decaying vegetation and soil.  The major outdoor mold, Alternaria and Cladosporium, are found in very high numbers during hot and dry conditions.  Generally, a late Summer - Fall peak is seen for common outdoor fungal spores.  Rain will temporarily lower outdoor mold spore count, but counts rise rapidly when the rainy period ends.  The most important indoor molds are Aspergillus and Penicillium.  Dark, humid and poorly ventilated basements are ideal sites for mold growth.  The next  most common sites of mold growth are the bathroom and the kitchen.  Outdoor (Seasonal) Mold Control  Positive outdoor molds via skin testing: Alternaria, Cladosporium, Bipolaris (Helminthsporium), Drechslera (Curvalaria) and Mucor  1. Use air conditioning and keep windows closed 2. Avoid exposure to decaying  vegetation. 3. Avoid leaf raking. 4. Avoid grain handling. 5. Consider wearing a face mask if working in moldy areas.  6.   Indoor (Perennial) Mold Control   Positive indoor molds via skin testing: Aspergillus and Penicillium  1. Maintain humidity below 50%. 2. Clean washable surfaces with 5% bleach solution. 3. Remove sources e.g. contaminated carpets.     Control of Cockroach Allergen  Cockroach allergen has been identified as an important cause of acute attacks of asthma, especially in urban settings.  There are fifty-five species of cockroach that exist in the [place], however only three, the [place], [place] species produce allergen that can affect patients with Asthma.  Allergens can be obtained from fecal particles, egg casings and secretions from cockroaches.    1. Remove food sources. 2. Reduce access to water. 3. Seal access and entry points. 4. Spray runways with 0.5-1% Diazinon or Chlorpyrifos 5. Blow boric acid power under stoves and refrigerator. 6. Place bait stations (hydramethylnon) at feeding sites.  Control of Dog or Cat Allergen  Avoidance is the best way to manage a dog or cat allergy. If you have a dog or cat and are allergic to dog or cats, consider removing the dog or cat from the home. If you have a dog or cat but don't want to find it a new home, or if your family wants a pet even though someone in the household is allergic, here are some strategies that may help keep symptoms at bay:  1. Keep the pet out of your bedroom and restrict it to only a few rooms. Be advised that keeping the dog or cat in only one room will not limit the allergens to that room. 2. Don't pet, hug or kiss the dog or cat; if you do, wash your hands with soap and water. 3. High-efficiency particulate air (HEPA) cleaners run continuously in a bedroom or living room can reduce allergen levels over time. 4. Regular use of a high-efficiency vacuum cleaner or a central  vacuum can reduce allergen levels. 5. Giving your dog or cat a bath at least once a week can reduce airborne allergen.  Allergy Shots   Allergies are the result of a chain reaction that starts in the immune system. Your immune system controls how your body defends itself. For instance, if you have an allergy to pollen, your immune system identifies pollen as an invader or allergen. Your immune system overreacts by producing antibodies called Immunoglobulin E (IgE). These antibodies travel to cells that release chemicals, causing an allergic reaction.  The concept behind allergy immunotherapy, whether it is received in the form of shots or tablets, is that the immune system can be desensitized to specific allergens that trigger allergy symptoms. Although it requires time and patience, the payback can be long-term relief.  How Do Allergy Shots Work?  Allergy shots work much like a vaccine. Your body responds to injected amounts of a particular allergen given in increasing doses, eventually developing a resistance and tolerance to it. Allergy shots can lead to decreased, minimal or no allergy symptoms.  There generally are two phases: build-up and maintenance. Build-up often ranges from three to six months and involves receiving injections with increasing  amounts of the allergens. The shots are typically given once or twice a week, though more rapid build-up schedules are sometimes used.  The maintenance phase begins when the most effective dose is reached. This dose is different for each person, depending on how allergic you are and your response to the build-up injections. Once the maintenance dose is reached, there are longer periods between injections, typically two to four weeks.  Occasionally doctors give cortisone-type shots that can temporarily reduce allergy symptoms. These types of shots are different and should not be confused with allergy immunotherapy shots.  Who Can Be Treated with  Allergy Shots?  Allergy shots may be a good treatment approach for people with allergic rhinitis (hay fever), allergic asthma, conjunctivitis (eye allergy) or stinging insect allergy.   Before deciding to begin allergy shots, you should consider:  . The length of allergy season and the severity of your symptoms . Whether medications and/or changes to your environment can control your symptoms . Your desire to avoid long-term medication use . Time: allergy immunotherapy requires a major time commitment . Cost: may vary depending on your insurance coverage  Allergy shots for children age 65 and older are effective and often well tolerated. They might prevent the onset of new allergen sensitivities or the progression to asthma.  Allergy shots are not started on patients who are pregnant but can be continued on patients who become pregnant while receiving them. In some patients with other medical conditions or who take certain common medications, allergy shots may be of risk. It is important to mention other medications you talk to your allergist.   When Will I Feel Better?  Some may experience decreased allergy symptoms during the build-up phase. For others, it may take as long as 12 months on the maintenance dose. If there is no improvement after a year of maintenance, your allergist will discuss other treatment options with you.  If you aren't responding to allergy shots, it may be because there is not enough dose of the allergen in your vaccine or there are missing allergens that were not identified during your allergy testing. Other reasons could be that there are high levels of the allergen in your environment or major exposure to non-allergic triggers like tobacco smoke.  What Is the Length of Treatment?  Once the maintenance dose is reached, allergy shots are generally continued for three to five years. The decision to stop should be discussed with your allergist at that time. Some  people may experience a permanent reduction of allergy symptoms. Others may relapse and a longer course of allergy shots can be considered.  What Are the Possible Reactions?  The two types of adverse reactions that can occur with allergy shots are local and systemic. Common local reactions include very mild redness and swelling at the injection site, which can happen immediately or several hours after. A systemic reaction, which is less common, affects the entire body or a particular body system. They are usually mild and typically respond quickly to medications. Signs include increased allergy symptoms such as sneezing, a stuffy nose or hives.  Rarely, a serious systemic reaction called anaphylaxis can develop. Symptoms include swelling in the throat, wheezing, a feeling of tightness in the chest, nausea or dizziness. Most serious systemic reactions develop within 30 minutes of allergy shots. This is why it is strongly recommended you wait in your doctor's office for 30 minutes after your injections. Your allergist is trained to watch for reactions, and his or  her staff is trained and equipped with the proper medications to identify and treat them.  Who Should Administer Allergy Shots?  The preferred location for receiving shots is your prescribing allergist's office. Injections can sometimes be given at another facility where the physician and staff are trained to recognize and treat reactions, and have received instructions by your prescribing allergist.

## 2018-08-24 NOTE — Progress Notes (Signed)
FOLLOW UP  Date of Service/Encounter:  08/24/18   Assessment:   Seasonal and perennial allergic rhinitis (grasses, indoor molds, outdoor molds, cat and cockroach)  Cough - with normal spirometry today  Plan/Recommendations:   1. Chronic rhinitis - Testing today showed: grasses, indoor molds, outdoor molds, cat and cockroach - Avoidance measures provided. - Start taking: Claritin (loratadine) 10mg  tablet once daily and Flonase (fluticasone) one spray per nostril on Mon/Wed/Fri and Astelin (azelastine) one spray per nostril on Sun/Tues/Thurs/Sat - You can use an extra dose of the antihistamine, if needed, for breakthrough symptoms.  - Consider nasal saline rinses 1-2 times daily to remove allergens from the nasal cavities as well as help with mucous clearance (this is especially helpful to do before the nasal sprays are given) - Consider allergy shots as a means of long-term control. - Allergy shots "re-train" and "reset" the immune system to ignore environmental allergens and decrease the resulting immune response to those allergens (sneezing, itchy watery eyes, runny nose, nasal congestion, etc).    - Allergy shots improve symptoms in 75-85% of patients.   2. Cough - Spirometry (breathing test) was normal. - This does not sound like asthma at all.  - However, we could do full pulmonary function testing in the future if needed, but I would like to give the new medications some time to work.   3. Return in about 3 months (around 11/24/2018).  Subjective:   Joan Townsend is a 77 y.o. female presenting today for follow up of  Chief Complaint  Patient presents with  . Allergies  . Nasal Congestion    Joan Townsend has a history of the following: Patient Active Problem List   Diagnosis Date Noted  . Seasonal and perennial allergic rhinitis 08/24/2018  . Renal insufficiency 11/10/2017  . Left ankle swelling 11/10/2017  . Chronic sinusitis 10/13/2017  . Multiple drug allergies  10/13/2017  . Dysuria 08/23/2017  . UTI (urinary tract infection) 04/08/2017  . Spider veins of both lower extremities 09/13/2016  . Other bursal cyst, right shoulder 07/26/2016  . 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 Meniscal cyst 06/21/2016  . Allergic rhinitis 06/10/2016  . 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 Bunion of great toe of right foot 04/23/2016  . Facial lesion 04/15/2016  . Right leg pain 04/15/2016  . Right knee pain 04/15/2016  . Palpitations 09/05/2014  . Essential hypertension 09/05/2014  . Hypersomnia 06/05/2014  . Prolapsed internal hemorrhoids, grade 2 07/18/2013  . Bladder pain 03/12/2013  . Abdominal pain, other specified site 03/12/2013  . Chest pain 06/29/2012  . Hyperlipidemia 06/29/2012  . Acquired solitary kidney   . Preventative health care 06/28/2012  . Hypothyroidism 01/05/2011    History obtained from: chart review and patient.  Aletha Halim Lant's Primary Care Provider is Biagio Borg, MD.     [name] is a 77 y.o. female presenting for a follow up visit.  She was last seen in December 2018.  At that time, we did obtain lab work to look for allergies.  Continued with nasal saline rinses.  She did have a cough, and the spirometry was not normal.  However, she declined to work this up further.  She has no history of wheezing or need for any inhalers.  She also has a history of multiple antibiotic allergies, which she did not have time to address at that first visit.  It seems that she must of been in a hurry during this first visit.  In any case, she was asked to follow-up in 3 months but presents now almost a year later.  Since the last visit, she has continued to have problems. She has been on multiple inhalers without any improvement. She has not undergone full pulmonary function testing. She did have a CXR earlier this month that was normal. She is no longer using any inhalers  whatsoever. She was on Singulair at one point - and while she did not feel that it actually helped - she remembers that she developed a rash over her lower abdomin.   Her main concern today revolves around a sensation of a "wad of mucous" in her lower throat. She has been on multiple antihistamines without improvement. She was last put on Singulair which caused a rash on her abdomen. It did not hlep her symptoms anyway. She has tried using mullen oil which she puts in one once and she drinks it. This seems to help. She also remembers that at some point, she underwent treatments some kind of liquid soaked cotton swabs in her nose, but this was years ago. She does not note any worsening of her symptoms in any kind of environment.   Otherwise, there have been no changes to her past medical history, surgical history, family history, or social history. 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     Review of Systems: a 14-point review of systems is pertinent for what is mentioned in HPI.  Otherwise, all other systems were negative.  Constitutional: negative other than that listed in the HPI Eyes: negative other than that listed in the HPI Ears, nose, mouth, throat, and face: negative other than that listed in the HPI Respiratory: negative other than that listed in the HPI Cardiovascular: negative other than that listed in the HPI Gastrointestinal: negative other than that listed in the HPI Genitourinary: negative other than that listed in the HPI Integument: negative other than that listed in the HPI Hematologic: negative other than that listed in the HPI Musculoskeletal: negative other than that listed in the HPI Neurological: negative other than that listed in the HPI Allergy/Immunologic: negative other than that listed in the HPI    Objective:   Blood pressure 140/70, pulse 79, temperature 98.1 F (36.7 C), temperature source Oral, resp. rate 18, height  5\' 4"  (1.626 m), weight 178 lb 6.4 oz (80.9 kg), SpO2 95 %. Body mass index is 30.62 kg/m.   Physical Exam:  General: Alert, interactive, in no acute distress. Pleasant female. Somewhat sassy female.  Eyes: No conjunctival injection bilaterally, no discharge on the right, no discharge on the left and no Horner-Trantas dots present. PERRL bilaterally. EOMI without pain. No photophobia.  Ears: Right TM pearly gray with normal light reflex, Left TM pearly gray with normal light reflex, Right TM intact without perforation and Left TM intact without perforation.  Nose/Throat: External nose within normal limits, nasal crease present and septum midline. Turbinates markedly edematous and pale with clear discharge. Posterior oropharynx erythematous without cobblestoning in the posterior oropharynx. Tonsils 2+ without exudates.  Tongue without thrush. Lungs: Clear to auscultation without wheezing, rhonchi or rales. No increased work of breathing. CV: Normal S1/S2. No murmurs. Capillary refill <2 seconds.  Skin: Warm and dry, without lesions or rashes. Neuro:   Grossly intact. No focal deficits appreciated. Responsive to questions.  Diagnostic studies:   Spirometry: results normal (FEV1:  1.67/84%, FVC: 2.14/80%, FEV1/FVC: 78%).    Spirometry consistent with normal pattern.   Allergy Studies:   Airborne Adult Perc - August 26, 2018 1455    Time Antigen Placed  1454    Allergen Manufacturer  Lavella Hammock    Location  Back    Number of Test  59    Panel 1  Select    1. Control-Buffer 50% Glycerol  Negative    2. Control-Histamine 1 mg/ml  3+    3. Albumin saline  Negative    4. [place]  Negative    5. [place]  Negative    6. [place]  Negative    7. [place] Blue  Negative    8. Meadow Fescue  Negative    9. Perennial Rye  Negative    10. Sweet Vernal  Negative    11. Timothy  Negative    12. Cocklebur  Negative    13. Burweed Marshelder  Negative    14. Ragweed, short  Negative    15. Ragweed, Giant   Negative    16. Plantain,  English  Negative    17. Lamb's Quarters  Negative    18. Sheep Sorrell  Negative    19. Rough Pigweed  Negative    20. Marsh Elder, Rough  Negative    21. Mugwort, Common  Negative    22. Ash mix  Negative    23. Birch mix  Negative    24. Beech American  Negative    25. Box, Elder  Negative    26. Cedar, red  Negative    27. Cottonwood, [place]  Negative    28. Elm mix  Negative    29. Hickory mix  Negative    30. Maple mix  Negative    31. Oak, [place] mix  Negative    32. Pecan Pollen  Negative    33. Pine mix  Negative    34. Sycamore Eastern  Negative    35. [place], Black Pollen  Negative    36. Alternaria alternata  Negative    37. Cladosporium Herbarum  Negative    38. Aspergillus mix  Negative    39. Penicillium mix  Negative    40. Bipolaris sorokiniana (Helminthosporium)  Negative    41. Drechslera spicifera (Curvularia)  Negative    42. Mucor plumbeus  Negative    43. Fusarium moniliforme  Negative    44. Aureobasidium pullulans (pullulara)  Negative    45. Rhizopus oryzae  Negative    46. Botrytis cinera  Negative    47. Epicoccum nigrum  Negative    48. Phoma betae  Negative    49. Candida Albicans  Negative    50. Trichophyton mentagrophytes  Negative    51. Mite, D Farinae  5,000 AU/ml  Negative    52. Mite, D Pteronyssinus  5,000 AU/ml  Negative    53. Cat Hair 10,000 BAU/ml  Negative    54.  Dog Epithelia  Negative    55. Mixed Feathers  Negative    56. Horse Epithelia  Negative    57. Cockroach, German  Negative    58. Mouse  Negative    59. Tobacco Leaf  Negative     Intradermal - 08-26-2018 1515    Time Antigen Placed  1524    Allergen Manufacturer  Lavella Hammock    Location  Arm    Number of Test  15    Intradermal  Select    Control  Negative  [place]  1+    Johnson  1+    7 Grass  1+    Ragweed mix  Negative    Weed mix  Negative    Tree mix  1+    Mold 1  2+    Mold 2  2+    Mold 3  2+    Mold 4  Negative     Cat  3+    Dog  Negative    Cockroach  Negative    Mite mix  Negative        Allergy testing results were read and interpreted by myself, documented by clinical staff.      Salvatore Marvel, MD  [facility]

## 2018-09-14 ENCOUNTER — Encounter: Payer: Self-pay | Admitting: Allergy

## 2018-09-14 ENCOUNTER — Ambulatory Visit (INDEPENDENT_AMBULATORY_CARE_PROVIDER_SITE_OTHER): Payer: PPO | Admitting: Allergy

## 2018-09-14 VITALS — BP 184/92 | HR 88 | Temp 98.4°F | Resp 18

## 2018-09-14 DIAGNOSIS — R05 Cough: Secondary | ICD-10-CM

## 2018-09-14 DIAGNOSIS — J302 Other seasonal allergic rhinitis: Secondary | ICD-10-CM | POA: Diagnosis not present

## 2018-09-14 DIAGNOSIS — J3089 Other allergic rhinitis: Secondary | ICD-10-CM

## 2018-09-14 DIAGNOSIS — R059 Cough, unspecified: Secondary | ICD-10-CM | POA: Insufficient documentation

## 2018-09-14 NOTE — Assessment & Plan Note (Signed)
Past history - 2019 skin testing was positive to grass, tree, mold, cat. Interim history - Claritin caused perioral rash, allegra was ineffective, Singulair apparently caused rash as well. Still complaining of sinus pressure, PND and coughing. Denies any heartburn symptoms.  May use over the counter antihistamines such as Zyrtec (cetirizine) or Xyzal (levocetirizine) daily as needed.  Flonase (fluticasone) one spray per nostril on Mon/Wed/Fri and Astelin (azelastine) one spray per nostril on Sun/Tues/Thurs/Sat as there is a history of epistaxis.   Discussed with patient about silent reflux and trying medications for this but she declines at this time.  Consider allergy injections if above regimen does not control symptoms.

## 2018-09-14 NOTE — Progress Notes (Signed)
Follow Up Note  RE: Joan Townsend MRN: [id number] DOB: 1941/06/11 Date of Office Visit: 09/14/2018  Referring provider: Biagio Borg, MD Primary care provider: Biagio Borg, MD  Chief Complaint: No chief complaint on file.  History of Present Illness: I had the pleasure of seeing Joan Townsend for a follow up visit at the [facility] on 09/14/2018. She is a 77 y.o. female, who is being followed for allergic rhinitis and cough. Today she is here for new complaint of perioral rash and persistent coughing. Her previous allergy office visit was on 08/24/2018 with Dr. Ernst Bowler.   1. Chronic rhinitis Patient broke out in perioral rash after Claritin. Tried allegra in the past which did not help.   Currently on Flonase 1 spray M/W/F and azelastine 1 spray on the other days. No epistaxis but she has history of epistaxis.   Patient has 1 cat at home who stays mainly in the garage.  Patient tried Singulair in the past which caused rash as well. She is not sure about starting allergy injections yet.   2. Cough Denies any reflux symptoms. Denies any SOB, chest tightness or wheezing.  Assessment and Plan: Joan Townsend is a 77 y.o. female with: Seasonal and perennial allergic rhinitis Past history - 2019 skin testing was positive to grass, tree, mold, cat. Interim history - Claritin caused perioral rash, allegra was ineffective, Singulair apparently caused rash as well. Still complaining of sinus pressure, PND and coughing. Denies any heartburn symptoms.  May use over the counter antihistamines such as Zyrtec (cetirizine) or Xyzal (levocetirizine) daily as needed.  Flonase (fluticasone) one spray per nostril on Mon/Wed/Fri and Astelin (azelastine) one spray per nostril on Sun/Tues/Thurs/Sat as there is a history of epistaxis.   Discussed with patient about silent reflux and trying medications for this but she declines at this time.  Consider allergy injections if above regimen does  not control symptoms.   Cough Persistent coughing. Discussed with patient - The most common causes of chronic cough include the following: upper airway cough syndrome (UACS) which is caused by variety of rhinitis conditions; asthma; gastroesophageal reflux disease (GERD); chronic bronchitis from cigarette smoking or other inhaled environmental irritants; non-asthmatic eosinophilic bronchitis; and bronchiectasis. In prospective studies, these conditions have accounted for up to 94% of the causes of chronic cough in immunocompetent adults.   Monitor symptoms. If not improved then consider reflux medications next.     Return in about 2 months (around 11/14/2018).  Diagnostics: Spirometry:  Tracings reviewed. Her effort: It was hard to get consistent efforts and there is a question as to whether this reflects a maximal maneuver. FVC: 1.82L FEV1: 1.20L, 60% predicted FEV1/FVC ratio: 66% Interpretation: Spirometry consistent with mild obstructive disease however effort was not ideal.  Please see scanned spirometry results for details.  Medication List:  Current Outpatient Medications  Medication Sig Dispense Refill  . Ascorbic Acid (VITAMIN C) 1000 MG tablet Take 1,000 mg by mouth daily.    Marland Kitchen azelastine (ASTELIN) 0.1 % nasal spray One spray per nostril on Sunday/Tuesday/Thursday/ and Saturday 30 mL 5  . Cholecalciferol (VITAMIN D-3) 5000 UNITS TABS Take by mouth daily.    . Coenzyme Q10 (CO Q 10) 100 MG CAPS Take 1 capsule by mouth daily.    Marland Kitchen estradiol (ESTRACE) 1 MG tablet Take 1 tablet (1 mg total) by mouth daily. 90 tablet 3  . fluticasone (CUTIVATE) 0.05 % cream APPLY ON THE SKIN TWICE A DAY AS NEEDED  AS NEEDED USE 1 WEEK ON/OFF AS NEEDED  2  . fluticasone (FLONASE) 50 MCG/ACT nasal spray One spray per nostril on Monday/Wednesday/ Friday 16 g 5  . ketoconazole (NIZORAL) 2 % cream APPLY ON THE SKIN TWICE A DAY AS NEEDED  2  . levothyroxine (SYNTHROID, LEVOTHROID) 125 MCG tablet Take 1  tablet (125 mcg total) by mouth daily. 90 tablet 3  . Magnesium 100 MG CAPS Take by mouth.    [name] 150 MG CAPS Take by mouth.    Marland Kitchen POTASSIUM IODIDE, ANTIDOTE, PO Take by mouth daily.    . vitamin B-12 (CYANOCOBALAMIN) 1000 MCG tablet Take 1,000 mcg by mouth daily.     No current facility-administered medications for this visit.    Allergies: Allergies  Allergen Reactions  . Ciprofloxacin Rash    REACTION: rash REACTION: rash  . Clindamycin/Lincomycin Anaphylaxis  . Penicillins Rash    REACTION: rash REACTION: rash  . Sulfa Antibiotics     Hives  . Sulfacetamide Sodium Hives    Hives  . Cefaclor   . Naproxen Other (See Comments)    Facial swelling   I reviewed her past medical history, social history, family history, and environmental history and no significant changes have been reported from previous visit on 08/24/2018.  Review of Systems  Constitutional: Negative for appetite change, chills, fever and unexpected weight change.  HENT: Positive for congestion and postnasal drip. Negative for rhinorrhea.   Eyes: Negative for itching.  Respiratory: Positive for cough. Negative for chest tightness, shortness of breath and wheezing.   Gastrointestinal: Negative for abdominal pain.  Skin: Negative for rash.  Allergic/Immunologic: Positive for environmental allergies.  Neurological: Positive for headaches.   Objective: BP (!) 184/92 (BP Location: Left Arm, Patient Position: Sitting, Cuff Size: Normal)   Pulse 88   Temp 98.4 F (36.9 C) (Oral)   Resp 18   SpO2 97%  There is no height or weight on file to calculate BMI. Physical Exam  Constitutional: She is oriented to person, place, and time. She appears well-developed and well-nourished.  HENT:  Head: Normocephalic and atraumatic.  Right Ear: External ear normal.  Left Ear: External ear normal.  Nose: Mucosal edema (on right side) present.  Mouth/Throat: Oropharynx is clear and moist.  Eyes:  Conjunctivae and EOM are normal.  Neck: Neck supple.  Cardiovascular: Normal rate, regular rhythm and normal heart sounds. Exam reveals no gallop and no friction rub.  No murmur heard. Pulmonary/Chest: Effort normal and breath sounds normal. She has no wheezes. She has no rales.  Lymphadenopathy:    She has no cervical adenopathy.  Neurological: She is alert and oriented to person, place, and time.  Skin: Skin is warm. No rash noted.  Psychiatric: She has a normal mood and affect. Her behavior is normal.  Nursing note and vitals reviewed.  Previous notes and tests were reviewed. The plan was reviewed with the patient/family, and all questions/concerned were addressed.  It was my pleasure to see Sayana today and participate in her care. Please feel free to contact me with any questions or concerns.  Sincerely,  Rexene Alberts, DO Allergy & Immunology  [facility] office: [phone] [place]

## 2018-09-14 NOTE — Patient Instructions (Addendum)
   May use over the counter antihistamines such as Zyrtec (cetirizine) or Xyzal (levocetirizine) daily as needed.  Flonase (fluticasone) one spray per nostril on Mon/Wed/Fri and Astelin (azelastine) one spray per nostril on Sun/Tues/Thurs/Sat  Consider allergy injections.  Follow up in 2 months

## 2018-09-14 NOTE — Assessment & Plan Note (Signed)
Persistent coughing. Discussed with patient - The most common causes of chronic cough include the following: upper airway cough syndrome (UACS) which is caused by variety of rhinitis conditions; asthma; gastroesophageal reflux disease (GERD); chronic bronchitis from cigarette smoking or other inhaled environmental irritants; non-asthmatic eosinophilic bronchitis; and bronchiectasis. In prospective studies, these conditions have accounted for up to 94% of the causes of chronic cough in immunocompetent adults.   Monitor symptoms. If not improved then consider reflux medications next.

## 2018-09-18 ENCOUNTER — Ambulatory Visit (INDEPENDENT_AMBULATORY_CARE_PROVIDER_SITE_OTHER): Payer: PPO | Admitting: Family

## 2018-09-18 ENCOUNTER — Encounter: Payer: Self-pay | Admitting: Family

## 2018-09-18 ENCOUNTER — Ambulatory Visit: Payer: Self-pay

## 2018-09-18 VITALS — BP 148/82 | HR 86 | Temp 98.2°F | Ht 64.0 in | Wt 172.0 lb

## 2018-09-18 DIAGNOSIS — R053 Chronic cough: Secondary | ICD-10-CM

## 2018-09-18 DIAGNOSIS — R05 Cough: Secondary | ICD-10-CM

## 2018-09-18 MED ORDER — AZITHROMYCIN 250 MG PO TABS: ORAL_TABLET | ORAL | 0 refills | Status: DC

## 2018-09-18 NOTE — Progress Notes (Signed)
Joan Townsend is a 77 y.o. female with the following history as recorded in [employer]:  Patient Active Problem List   Diagnosis Date Noted  . Cough 09/14/2018  . Seasonal and perennial allergic rhinitis 08/24/2018  . Renal insufficiency 11/10/2017  . Left ankle swelling 11/10/2017  . Chronic sinusitis 10/13/2017  . Multiple drug allergies 10/13/2017  . Dysuria 08/23/2017  . UTI (urinary tract infection) 04/08/2017  . Spider veins of both lower extremities 09/13/2016  . Other bursal cyst, right shoulder 07/26/2016  . 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 Meniscal cyst 06/21/2016  . Allergic rhinitis 06/10/2016  . 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 Bunion of great toe of right foot 04/23/2016  . Facial lesion 04/15/2016  . Right leg pain 04/15/2016  . Right knee pain 04/15/2016  . Palpitations 09/05/2014  . Essential hypertension 09/05/2014  . Hypersomnia 06/05/2014  . Prolapsed internal hemorrhoids, grade 2 07/18/2013  . Bladder pain 03/12/2013  . Abdominal pain, other specified site 03/12/2013  . Chest pain 06/29/2012  . Hyperlipidemia 06/29/2012  . Acquired solitary kidney   . Preventative health care 06/28/2012  . Hypothyroidism 01/05/2011    Current Outpatient Medications  Medication Sig Dispense Refill  . Ascorbic Acid (VITAMIN C) 1000 MG tablet Take 1,000 mg by mouth daily.    Marland Kitchen azelastine (ASTELIN) 0.1 % nasal spray One spray per nostril on Sunday/Tuesday/Thursday/ and Saturday 30 mL 5  . Cholecalciferol (VITAMIN D-3) 5000 UNITS TABS Take by mouth daily.    . Coenzyme Q10 (CO Q 10) 100 MG CAPS Take 1 capsule by mouth daily.    Marland Kitchen estradiol (ESTRACE) 1 MG tablet Take 1 tablet (1 mg total) by mouth daily. 90 tablet 3  . fluticasone (FLONASE) 50 MCG/ACT nasal spray One spray per nostril on Monday/Wednesday/ Friday 16 g 5  . levothyroxine (SYNTHROID, LEVOTHROID) 125 MCG tablet Take 1 tablet  (125 mcg total) by mouth daily. 90 tablet 3  . Magnesium 100 MG CAPS Take by mouth.    [name] 150 MG CAPS Take by mouth.    Marland Kitchen POTASSIUM IODIDE, ANTIDOTE, PO Take by mouth daily.    . vitamin B-12 (CYANOCOBALAMIN) 1000 MCG tablet Take 1,000 mcg by mouth daily.    Marland Kitchen azithromycin (ZITHROMAX) 250 MG tablet 2 tabs po qd x 1 day; 1 tablet per day x 4 days; 6 tablet 0   No current facility-administered medications for this visit.     Allergies: Ciprofloxacin; Clindamycin/lincomycin; Penicillins; Sulfa antibiotics; Sulfacetamide sodium; Cefaclor; and Naproxen  Past Medical History:  Diagnosis Date  . Acquired solitary kidney    s/p right nephrectomy  . Angio-edema   . Endometriosis   . Hemorrhoids   . Hepatitis    result of Scarlet fever  . History of scarlet fever   . Hyperlipidemia 06/29/2012  . Hypothyroidism   . HYPOTHYROIDISM 01/05/2011   Qualifier: Diagnosis of  By: Carlean Purl MD, Dimas Millin Urticaria     Past Surgical History:  Procedure Laterality Date  . ABDOMINAL HYSTERECTOMY    . APPENDECTOMY    . CHOLECYSTECTOMY  2012  . COLON RESECTION     part of endometriosis surgery  . COLONOSCOPY  2006   hemorrhoids(Dr. Lajoyce Corners)  . HEMORRHOID BANDING  2014  . NEPHRECTOMY     right, due to birth deformity    Family History  Problem Relation Age of  Onset  . Breast cancer Unknown        great aunts x2, cousin  . Ovarian cancer Unknown        aunt  . Uterine cancer Mother   . Colon cancer Unknown        great aunts x3  . Heart disease Maternal Grandfather     Social History   Tobacco Use  . Smoking status: Former Smoker    Types: Cigarettes    Last attempt to quit: 10/25/1978    Years since quitting: 39.9  . Smokeless tobacco: Never Used  Substance Use Topics  . Alcohol use: Yes    Comment: occasional     Subjective:   Patient presents today again with concerns for chronic cough;  Very difficult historian- she said symptoms present x 1 month but in  reviewing her notes it looks like she has had chronic sinus problems since 2018- saw ENT in August 2018/ refusing to go back; saw her allergist about these symptoms last Thursday- did not feel like they were listening to her; notes allergy shots have been recommended but does not want to make that type of commitment; did have normal CXR earlier this fall;  Is concerned that she may have some type of mold/ fungal source or infection; wants to get tested for this; is requesting Rx for Zithromax today to help for the holiday week;      Objective:  Vitals:   09/18/18 1544  BP: (!) 148/82  Pulse: 86  Temp: 98.2 F (36.8 C)  TempSrc: Oral  SpO2: 95%  Weight: 172 lb 0.6 oz (78 kg)  Height: 5\' 4"  (1.626 m)    General: Well developed, well nourished, in no acute distress  Skin : Warm and dry.  Head: Normocephalic and atraumatic  Eyes: Sclera and conjunctiva clear; pupils round and reactive to light; extraocular movements intact  Ears: External normal; canals clear; tympanic membranes normal  Oropharynx: Pink, supple. No suspicious lesions  Neck: Supple without thyromegaly, adenopathy  Lungs: Respirations unlabored; clear to auscultation bilaterally without wheeze, rales, rhonchi  Neurologic: Alert and oriented; speech intact; face symmetrical; moves all extremities well; CNII-XII intact without focal deficit   Assessment:  1. Chronic cough     Plan:  Agree to treat with Z-pak per patient request; discussed prednisone as treatment but she notes she is allergic; she will contact her allergist to see how to get testing done for fungal or mold infection/ discussed having her home checked as well; she does not want to go back to ENT she has seen previously- if needs ENT, requesting to see a different specialist; she will let our office know what she needs after speaking to her allergist.   Spent 30 minutes with patient; greater than 50% spent in counseling/ reviewing notes/ determining timelines  of treatment;    No follow-ups on file.  No orders of the defined types were placed in this encounter.   Requested Prescriptions   Signed Prescriptions Disp Refills  . azithromycin (ZITHROMAX) 250 MG tablet 6 tablet 0    Sig: 2 tabs po qd x 1 day; 1 tablet per day x 4 days;

## 2018-09-18 NOTE — Telephone Encounter (Signed)
Pt c/o sinus pain and nonproductive cough for the past month. Pt stated that she is having headache, and pain to cheeks, forehead, around both eyes and teeth pain. Pt c/o ear congestion and is hearing a  popping sound. Pt stated that she is having greenish yellow and thick nasal secretions. Pt c/o cough that cause her to cough for 2 minutes that tire her. Pt stated that she is tired of feeling this way and is frustrated that she is going from "doctor to doctor without any relief." Pt is requesting Z pack. Pt denies fever, sore throat, difficulty breathing. Pt stated that she is taking "arnica", Mucinex and saline nasal spray, Flonase and Astelin.  Care advice given and pt verbalized understanding. No available appointments with PCP. Appt made with Jodi Mourning NP today at 3:40.   Reason for Disposition . Earache  Answer Assessment - Initial Assessment Questions 1. LOCATION: "Where does it hurt?"      Cheeks, forehead, around eyes, entire head hurts, teeth  2. ONSET: "When did the sinus pain start?"  (e.g., hours, days)      1 month ago 3. SEVERITY: "How bad is the pain?"   (Scale 1-10; mild, moderate or severe)   - MILD (1-3): doesn't interfere with normal activities    - MODERATE (4-7): interferes with normal activities (e.g., work or school) or awakens from sleep   - SEVERE (8-10): excruciating pain and patient unable to do any normal activities       moderate 4. RECURRENT SYMPTOM: "Have you ever had sinus problems before?" If so, ask: "When was the last time?" and "What happened that time?"      Yes 50 years ago packed nose and sat under a light pt stated that is the most relief you have had  5. NASAL CONGESTION: "Is the nose blocked?" If so, ask, "Can you open it or must you breathe through the mouth?"     Both at night mouth breathe 6. NASAL DISCHARGE: "Do you have discharge from your nose?" If so ask, "What color?"     Yes- greenish yellow and thick 7. FEVER: "Do you have a fever?" If so,  ask: "What is it, how was it measured, and when did it start?"      No  8. OTHER SYMPTOMS: "Do you have any other symptoms?" (e.g., sore throat, cough, earache, difficulty breathing)     Cough, ear congestion and popping 9. PREGNANCY: "Is there any chance you are pregnant?" "When was your last menstrual period?"     n/a  Protocols used: SINUS PAIN OR CONGESTION-A-AH

## 2018-09-19 ENCOUNTER — Telehealth: Payer: Self-pay | Admitting: *Deleted

## 2018-09-19 DIAGNOSIS — J310 Chronic rhinitis: Secondary | ICD-10-CM

## 2018-09-19 NOTE — Telephone Encounter (Signed)
I think she needs to see an ear nose and throat doctor for this.  Referral placed for ENT for evaluation of chronic rhinitis?  I will let [facility] know.  Salvatore Marvel, MD [facility]

## 2018-09-19 NOTE — Telephone Encounter (Signed)
Patient would like to talk to Dr Ernst Bowler in regards to getting sinus infections. Patient states that went to pcp yesterday and they want her to have a test to determine if the infections are being caused by a bacteria or fungus. Dr Ernst Bowler please advise if we can do it or can we refer her to someone.

## 2018-09-20 NOTE — Telephone Encounter (Signed)
Referral placed to Dr Velvet Bathe office. Patient has been notified with their office contact info.

## 2018-09-25 ENCOUNTER — Telehealth: Payer: Self-pay | Admitting: *Deleted

## 2018-09-25 NOTE — Telephone Encounter (Signed)
Noted. Thanks, Dee.   , MD [facility]  

## 2018-09-25 NOTE — Telephone Encounter (Signed)
Patient called stating Dr Deeann Saint next available is 10/27/18, and she is not waiting that long to be seen. Please advise [phone]

## 2018-09-25 NOTE — Telephone Encounter (Signed)
Patient didn't want to wait to go to Dr Benjamine Mola on 10/27/2018. She is set up to see Dr Ernesto Rutherford on 09/26/2018.   Thanks

## 2018-09-28 ENCOUNTER — Other Ambulatory Visit: Payer: Self-pay | Admitting: Otolaryngology

## 2018-09-28 ENCOUNTER — Ambulatory Visit
Admission: RE | Admit: 2018-09-28 | Discharge: 2018-09-28 | Disposition: A | Discharge status: Home / Self Care | Payer: PPO | Source: Ambulatory Visit | Attending: Otolaryngology | Admitting: Otolaryngology

## 2018-09-28 DIAGNOSIS — J320 Chronic maxillary sinusitis: Secondary | ICD-10-CM

## 2018-09-28 DIAGNOSIS — K210 Gastro-esophageal reflux disease with esophagitis: Secondary | ICD-10-CM | POA: Diagnosis not present

## 2018-09-28 DIAGNOSIS — J019 Acute sinusitis, unspecified: Secondary | ICD-10-CM | POA: Diagnosis not present

## 2018-09-28 DIAGNOSIS — J322 Chronic ethmoidal sinusitis: Secondary | ICD-10-CM

## 2018-10-27 DIAGNOSIS — H10023 Other mucopurulent conjunctivitis, bilateral: Secondary | ICD-10-CM | POA: Diagnosis not present

## 2018-11-07 ENCOUNTER — Ambulatory Visit: Payer: PPO | Admitting: Physician Assistant

## 2018-11-07 ENCOUNTER — Encounter: Payer: Self-pay | Admitting: Physician Assistant

## 2018-11-07 ENCOUNTER — Other Ambulatory Visit (INDEPENDENT_AMBULATORY_CARE_PROVIDER_SITE_OTHER): Payer: PPO

## 2018-11-07 VITALS — BP 124/80 | HR 82 | Ht 65.0 in | Wt 169.0 lb

## 2018-11-07 DIAGNOSIS — K644 Residual hemorrhoidal skin tags: Secondary | ICD-10-CM | POA: Diagnosis not present

## 2018-11-07 DIAGNOSIS — R1033 Periumbilical pain: Secondary | ICD-10-CM

## 2018-11-07 DIAGNOSIS — S301XXA Contusion of abdominal wall, initial encounter: Secondary | ICD-10-CM

## 2018-11-07 LAB — CBC WITH DIFFERENTIAL/PLATELET
Basophils Absolute: 0.1 10*3/uL (ref 0.0–0.1)
Basophils Relative: 1.7 % (ref 0.0–3.0)
Eosinophils Absolute: 0.1 10*3/uL (ref 0.0–0.7)
Eosinophils Relative: 2.5 % (ref 0.0–5.0)
HCT: 43.1 % (ref 36.0–46.0)
Hemoglobin: 14.2 g/dL (ref 12.0–15.0)
LYMPHS PCT: 27.1 % (ref 12.0–46.0)
Lymphs Abs: 1.6 10*3/uL (ref 0.7–4.0)
MCHC: 32.9 g/dL (ref 30.0–36.0)
MCV: 89 fl (ref 78.0–100.0)
Monocytes Absolute: 0.5 10*3/uL (ref 0.1–1.0)
Monocytes Relative: 9 % (ref 3.0–12.0)
Neutro Abs: 3.6 10*3/uL (ref 1.4–7.7)
Neutrophils Relative %: 59.7 % (ref 43.0–77.0)
Platelets: 260 10*3/uL (ref 150.0–400.0)
RBC: 4.84 Mil/uL (ref 3.87–5.11)
RDW: 15.1 % (ref 11.5–15.5)
WBC: 6 10*3/uL (ref 4.0–10.5)

## 2018-11-07 LAB — BASIC METABOLIC PANEL
BUN: 15 mg/dL (ref 6–23)
CO2: 28 mEq/L (ref 19–32)
Calcium: 9.7 mg/dL (ref 8.4–10.5)
Chloride: 106 mEq/L (ref 96–112)
Creatinine, Ser: 1.12 mg/dL (ref 0.40–1.20)
GFR: 49.75 mL/min — ABNORMAL LOW (ref >60.00)
Glucose, Bld: 90 mg/dL (ref 70–99)
Potassium: 4.4 mEq/L (ref 3.5–5.1)
Sodium: 142 mEq/L (ref 135–145)

## 2018-11-07 LAB — PROTIME-INR
INR: 1 ratio (ref 0.8–1.0)
Prothrombin Time: 11.2 s (ref 9.6–13.1)

## 2018-11-07 MED ORDER — HYDROCORTISONE ACETATE 25 MG RE SUPP: RECTAL | 3 refills | Status: DC

## 2018-11-07 NOTE — Progress Notes (Signed)
Subjective:    Patient ID: Joan Townsend, female    DOB: 1940-11-26, 78 y.o.   MRN: [id number]  HPI Joan Townsend is a pleasant 78 year old white female, known to Dr. Carlean Townsend who was last here in August 2017 for hemorrhoidal banding.  She has history of hypertension, solitary kidney status post resection of nonfunctioning kidney at age 61, status post hysterectomy and bladder sling done in the early 1990s.  She has history of multiple drug allergies. She had colonoscopy apparently about 18 years ago and says that was her only colonoscopy she intends to have. She comes in today with concerns about an area of bruising on her mid abdomen which she noticed within the past week and became concerned.  She feels certain that she did not sustain any sort of injury or bump into anything.  She says this area was purplish and sore initially.  The soreness has improved but is still present.  She feels that she noticed some purplish appearing areas bilaterally in her perineal area which have resolved.  She has not noticed any other areas of bruising.  She has also had a flare of hemorrhoids over the past couple of weeks she says have been sore especially with sitting, and palpable externally.  She has not had any bleeding.  She denies any changes in her bowel habits, hard stools straining etc.  She has been using Preparation H which seems to be helping. She is not had any recent changes in medications or supplements.  Review of Systems Pertinent positive and negative review of systems were noted in the above HPI section.  All other review of systems was otherwise negative.  Outpatient Encounter Medications as of 11/07/2018  Medication Sig  . Ascorbic Acid (VITAMIN C) 1000 MG tablet Take 1,000 mg by mouth daily.  Marland Kitchen azelastine (ASTELIN) 0.1 % nasal spray One spray per nostril on Sunday/Tuesday/Thursday/ and Saturday  . Cholecalciferol (VITAMIN D-3) 5000 UNITS TABS Take by mouth daily.  . Coenzyme Q10 (CO Q 10) 100 MG CAPS  Take 1 capsule by mouth daily.  Marland Kitchen estradiol (ESTRACE) 1 MG tablet Take 1 tablet (1 mg total) by mouth daily.  . fluticasone (FLONASE) 50 MCG/ACT nasal spray One spray per nostril on Monday/Wednesday/ Friday  . levothyroxine (SYNTHROID, LEVOTHROID) 125 MCG tablet Take 1 tablet (125 mcg total) by mouth daily.  . Magnesium 100 MG CAPS Take by mouth.  Joan Townsend Leaf Extract 150 MG CAPS Take by mouth.  Marland Kitchen POTASSIUM IODIDE, ANTIDOTE, PO Take by mouth daily.  . vitamin B-12 (CYANOCOBALAMIN) 1000 MCG tablet Take 1,000 mcg by mouth daily.  . hydrocortisone (ANUSOL-HC) 25 MG suppository Place 1 suppository in the rectum at bedtime.  . [DISCONTINUED] azithromycin (ZITHROMAX) 250 MG tablet 2 tabs po qd x 1 day; 1 tablet per day x 4 days;   No facility-administered encounter medications on file as of 11/07/2018.    Allergies  Allergen Reactions  . Ciprofloxacin Rash    REACTION: rash REACTION: rash  . Clindamycin/Lincomycin Anaphylaxis  . Penicillins Rash    REACTION: rash REACTION: rash  . Sulfa Antibiotics     Hives  . Sulfacetamide Sodium Hives    Hives  . Cefaclor   . Naproxen Other (See Comments)    Facial swelling   Patient Active Problem List   Diagnosis Date Noted  . Cough 09/14/2018  . Seasonal and perennial allergic rhinitis 08/24/2018  . Renal insufficiency 11/10/2017  . Left ankle swelling 11/10/2017  . Chronic sinusitis 10/13/2017  .  Multiple drug allergies 10/13/2017  . Dysuria 08/23/2017  . UTI (urinary tract infection) 04/08/2017  . Spider veins of both lower extremities 09/13/2016  . Other bursal cyst, right shoulder 07/26/2016  . 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 Meniscal cyst 06/21/2016  . Allergic rhinitis 06/10/2016  . 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 Bunion of great toe of right foot 04/23/2016  . Facial lesion 04/15/2016  . Right leg pain 04/15/2016  . Right knee pain  04/15/2016  . Palpitations 09/05/2014  . Essential hypertension 09/05/2014  . Hypersomnia 06/05/2014  . Prolapsed internal hemorrhoids, grade 2 07/18/2013  . Bladder pain 03/12/2013  . Abdominal pain, other specified site 03/12/2013  . Chest pain 06/29/2012  . Hyperlipidemia 06/29/2012  . Acquired solitary kidney   . Preventative health care 06/28/2012  . Hypothyroidism 01/05/2011   Social History   Socioeconomic History  . Marital status: Widowed    Spouse name: Not on file  . Number of children: 1  . Years of education: Not on file  . Highest education level: Not on file  Occupational History  . Occupation: [employer]  Social Needs  . Financial resource strain: Not on file  . Food insecurity:    Worry: Not on file    Inability: Not on file  . Transportation needs:    Medical: Not on file    Non-medical: Not on file  Tobacco Use  . Smoking status: Former Smoker    Types: Cigarettes    Last attempt to quit: 10/25/1978    Years since quitting: 40.0  . Smokeless tobacco: Never Used  Substance and Sexual Activity  . Alcohol use: Yes    Comment: occasional   . Drug use: No  . Sexual activity: Not on file  Lifestyle  . Physical activity:    Days per week: Not on file    Minutes per session: Not on file  . Stress: Not on file  Relationships  . Social connections:    Talks on phone: Not on file    Gets together: Not on file    Attends religious service: Not on file    Active member of club or organization: Not on file    Attends meetings of clubs or organizations: Not on file    Relationship status: Not on file  . Intimate partner violence:    Fear of current or ex partner: Not on file    Emotionally abused: Not on file    Physically abused: Not on file    Forced sexual activity: Not on file  Other Topics Concern  . Not on file  Social History Narrative  . Not on file    Ms. Setzler's family history includes Breast cancer in an other family member; Colon  cancer in an other family member; Heart disease in her maternal grandfather; Lung cancer in her sister; Ovarian cancer in an other family member; Uterine cancer in her mother.      Objective:    Vitals:   11/07/18 0918  BP: 124/80  Pulse: 82    Physical Exam; Well-developed elderly white female in no acute distress, very pleasant, height 5 foot 5, weight 169, BMI 28.1.  HEENT; nontraumatic normocephalic EOMI PERRLA sclera anicteric mucosa moist, Cardiovascular; regular rate and rhythm with S1-S2, Pulmonary; clear bilaterally, Abdomen ;soft, bowel sounds are present, low midline incisional scars, she has an area of yellowish-green ecchymosis to the  left of the umbilicus about 5 inches in diameter, this area is tender, there is no palpable fluctuance or hematoma, she is also tender in the periumbilical area.  Examination of the groin benign.  Rectal ;exam patient has edematous external hemorrhoids nonbleeding, stool heme-negative.  Extremities; no clubbing cyanosis or edema, skin warm dry.  No other areas of ecchymosis. Neuropsych; alert and oriented, grossly nonfocal mood and affect appropriate       Assessment & Plan:   #72 78 year old white female with 1 to 2-week history of periumbilical/left mid abdominal ecchymosis which was spontaneous.  This is been associated with periumbilical/mid abdominal tenderness. Etiology not clear.  #2 symptomatic external hemorrhoids, edematous but not bleeding nonthrombosed. #3 status post prior internal hemorrhoidal banding times 11/2015 #4 status post nephrectomy remote, hysterectomy and prior bladder  sling remote #5 hypertension  Plan; CBC with differential, pro time/INR, be met Schedule for CT scan of the abdomen and pelvis with contrast For hemorrhoids she will be given Anusol suppositories to use nightly x10 days, then repeat as needed for exacerbations. Can continue using Preparation H externally, and also recommended she try RectiCare complete  OTC hemorrhoid preparation which may be more effective. Further plans pending results of above.   Genia Harold PA-C 11/07/2018   Cc: Biagio Borg, MD

## 2018-11-07 NOTE — Patient Instructions (Signed)
Please go to the basement level to have your labs drawn.  You have been scheduled for a CT scan of the abdomen and pelvis at [facility] ([address]---this is in the same building as [facility]).   You are scheduled on 11-10-18 at 3:00 pm. You should arrive at 2:45 pm to your appointment time for registration. Please follow the written instructions below on the day of your exam:  WARNING: IF YOU ARE ALLERGIC TO IODINE/X-RAY DYE, PLEASE NOTIFY RADIOLOGY IMMEDIATELY AT [phone]! YOU WILL BE GIVEN A 13 HOUR PREMEDICATION PREP.  1) Do not eat  anything after 11:00 am (4 hours prior to your test) 2) You have been given 2 bottles of oral contrast to drink. The solution may taste better if refrigerated, but do NOT add ice or any other liquid to this solution. Shake well before drinking.    Drink 1 bottle of contrast @ 1:00 Pm (2 hours prior to your exam)  Drink 1 bottle of contrast @ 2:00 pm (1 hour prior to your exam)  You may take any medications as prescribed with a small amount of water, if necessary. If you take any of the following medications: METFORMIN, GLUCOPHAGE, GLUCOVANCE, AVANDAMET, RIOMET, FORTAMET, [place] MET, JANUMET, GLUMETZA or METAGLIP, you MAY be asked to HOLD this medication 48 hours AFTER the exam.  The purpose of you drinking the oral contrast is to aid in the visualization of your intestinal tract. The contrast solution may cause some diarrhea. Depending on your individual set of symptoms, you may also receive an intravenous injection of x-ray contrast/dye. Plan on being at [facility] for 30 minutes or longer, depending on the type of exam you are having performed.  This test typically takes 30-45 minutes to complete.  If you have any questions regarding your exam or if you need to reschedule, you may call the CT department at [phone] between the hours of 8:00 am and 5:00 pm, Monday-Friday.  Use Preperation H suppositories, use 1 in  the rectum at bedtime for hemorrhoids.  We have given  You a coupon for Recticare advanced for external hemorrhoids.  ________________________________________________________________________

## 2018-11-08 ENCOUNTER — Telehealth: Payer: Self-pay | Admitting: *Deleted

## 2018-11-08 ENCOUNTER — Other Ambulatory Visit: Payer: Self-pay | Admitting: *Deleted

## 2018-11-08 MED ORDER — HYDROCORTISONE 2.5 % RE CREA: TOPICAL_CREAM | RECTAL | 1 refills | Status: DC

## 2018-11-08 NOTE — Telephone Encounter (Signed)
Called Joan Townsend to advise that the insurance company would not completely cover the Anusol HC Suppositories.  She said her portion to pay is 105.00.  She said she is going to pick them up this time but may not in the future.  I told her I could send Anusol HC cream that the insurance company will cover. She can apply the cream on the suppository and insert in the rectum at bedtime.  She asked me to send a prescription for the cream and thanked me.

## 2018-11-10 ENCOUNTER — Ambulatory Visit (INDEPENDENT_AMBULATORY_CARE_PROVIDER_SITE_OTHER)
Admission: RE | Admit: 2018-11-10 | Discharge: 2018-11-10 | Disposition: A | Discharge status: Home / Self Care | Payer: PPO | Source: Ambulatory Visit | Attending: Physician Assistant | Admitting: Physician Assistant

## 2018-11-10 DIAGNOSIS — S301XXA Contusion of abdominal wall, initial encounter: Secondary | ICD-10-CM | POA: Diagnosis not present

## 2018-11-10 DIAGNOSIS — N200 Calculus of kidney: Secondary | ICD-10-CM | POA: Diagnosis not present

## 2018-11-10 DIAGNOSIS — R1033 Periumbilical pain: Secondary | ICD-10-CM

## 2018-11-10 MED ORDER — IOPAMIDOL (ISOVUE-300) INJECTION 61%
100.0000 mL | Freq: Once | INTRAVENOUS | Status: AC | PRN
  Administered 2018-11-10: 100 mL via INTRAVENOUS

## 2018-11-13 ENCOUNTER — Telehealth: Payer: Self-pay | Admitting: Physician Assistant

## 2018-11-13 ENCOUNTER — Telehealth: Payer: Self-pay | Admitting: Internal Medicine

## 2018-11-13 NOTE — Telephone Encounter (Signed)
Patient thought she was calling GI for results- she will try them.

## 2018-11-13 NOTE — Telephone Encounter (Signed)
Left a voicemail that the results are in but have not be reviewed.

## 2018-11-13 NOTE — Telephone Encounter (Unsigned)
Copied from [place] [phone]. Topic: General - Other >> Nov 13, 2018  1:36 PM Yvette Rack wrote: Reason for CRM: Pt called in for CT results. Pt requests call back. Cb# [phone]

## 2018-11-13 NOTE — Telephone Encounter (Signed)
Faxed over the phone [facility] a note on the form they sent. I spoke to the patient on Friday 11-10-18. She had to pay a portion of the cost for the hydrocortisone suppositories. She said she was fine with that.  I noted this on the form and faxed it back to [phone].

## 2018-11-13 NOTE — Telephone Encounter (Signed)
Patient wanting to know if results from CT done on 1.17.20 are back.

## 2018-11-14 NOTE — Telephone Encounter (Signed)
Pt called to enquire about results from CT.

## 2018-11-15 NOTE — Telephone Encounter (Signed)
Discussed the report with the patient. She was asking for definitions on some of the results of her ct scan.

## 2018-11-16 DIAGNOSIS — H00021 Hordeolum internum right upper eyelid: Secondary | ICD-10-CM | POA: Diagnosis not present

## 2018-12-15 DIAGNOSIS — H00024 Hordeolum internum left upper eyelid: Secondary | ICD-10-CM | POA: Diagnosis not present

## 2018-12-20 DIAGNOSIS — H00022 Hordeolum internum right lower eyelid: Secondary | ICD-10-CM | POA: Diagnosis not present

## 2018-12-29 ENCOUNTER — Other Ambulatory Visit: Payer: Self-pay | Admitting: Internal Medicine

## 2018-12-30 DIAGNOSIS — H04123 Dry eye syndrome of bilateral lacrimal glands: Secondary | ICD-10-CM | POA: Diagnosis not present

## 2019-02-09 DIAGNOSIS — H1131 Conjunctival hemorrhage, right eye: Secondary | ICD-10-CM | POA: Diagnosis not present

## 2019-02-09 DIAGNOSIS — H04123 Dry eye syndrome of bilateral lacrimal glands: Secondary | ICD-10-CM | POA: Diagnosis not present

## 2019-02-09 DIAGNOSIS — H0100A Unspecified blepharitis right eye, upper and lower eyelids: Secondary | ICD-10-CM | POA: Diagnosis not present

## 2019-02-09 DIAGNOSIS — H0100B Unspecified blepharitis left eye, upper and lower eyelids: Secondary | ICD-10-CM | POA: Diagnosis not present

## 2019-02-22 DIAGNOSIS — H04123 Dry eye syndrome of bilateral lacrimal glands: Secondary | ICD-10-CM | POA: Diagnosis not present

## 2019-02-22 DIAGNOSIS — H16101 Unspecified superficial keratitis, right eye: Secondary | ICD-10-CM | POA: Diagnosis not present

## 2019-04-09 DIAGNOSIS — N200 Calculus of kidney: Secondary | ICD-10-CM | POA: Diagnosis not present

## 2019-04-09 DIAGNOSIS — R3121 Asymptomatic microscopic hematuria: Secondary | ICD-10-CM | POA: Diagnosis not present

## 2019-05-16 DIAGNOSIS — R3121 Asymptomatic microscopic hematuria: Secondary | ICD-10-CM | POA: Diagnosis not present

## 2019-05-16 DIAGNOSIS — N200 Calculus of kidney: Secondary | ICD-10-CM | POA: Diagnosis not present

## 2019-07-27 DIAGNOSIS — Z1231 Encounter for screening mammogram for malignant neoplasm of breast: Secondary | ICD-10-CM | POA: Diagnosis not present

## 2019-07-27 DIAGNOSIS — Z01419 Encounter for gynecological examination (general) (routine) without abnormal findings: Secondary | ICD-10-CM | POA: Diagnosis not present

## 2019-07-27 LAB — HM MAMMOGRAPHY

## 2019-09-17 ENCOUNTER — Other Ambulatory Visit: Payer: Self-pay

## 2020-01-30 DIAGNOSIS — M9903 Segmental and somatic dysfunction of lumbar region: Secondary | ICD-10-CM | POA: Diagnosis not present

## 2020-02-05 DIAGNOSIS — M9903 Segmental and somatic dysfunction of lumbar region: Secondary | ICD-10-CM | POA: Diagnosis not present

## 2020-02-12 DIAGNOSIS — M9903 Segmental and somatic dysfunction of lumbar region: Secondary | ICD-10-CM | POA: Diagnosis not present

## 2020-02-19 DIAGNOSIS — M9903 Segmental and somatic dysfunction of lumbar region: Secondary | ICD-10-CM | POA: Diagnosis not present

## 2020-02-26 DIAGNOSIS — M9903 Segmental and somatic dysfunction of lumbar region: Secondary | ICD-10-CM | POA: Diagnosis not present

## 2020-03-19 DIAGNOSIS — M9903 Segmental and somatic dysfunction of lumbar region: Secondary | ICD-10-CM | POA: Diagnosis not present

## 2020-03-31 DIAGNOSIS — M9903 Segmental and somatic dysfunction of lumbar region: Secondary | ICD-10-CM | POA: Diagnosis not present

## 2020-04-18 DIAGNOSIS — M1711 Unilateral primary osteoarthritis, right knee: Secondary | ICD-10-CM | POA: Diagnosis not present

## 2020-04-18 DIAGNOSIS — M5431 Sciatica, right side: Secondary | ICD-10-CM | POA: Diagnosis not present

## 2020-05-12 DIAGNOSIS — R300 Dysuria: Secondary | ICD-10-CM | POA: Diagnosis not present

## 2020-05-19 DIAGNOSIS — M1711 Unilateral primary osteoarthritis, right knee: Secondary | ICD-10-CM | POA: Diagnosis not present

## 2020-05-21 DIAGNOSIS — N200 Calculus of kidney: Secondary | ICD-10-CM | POA: Diagnosis not present

## 2020-05-21 DIAGNOSIS — N3000 Acute cystitis without hematuria: Secondary | ICD-10-CM | POA: Diagnosis not present

## 2020-06-10 DIAGNOSIS — M2011 Hallux valgus (acquired), right foot: Secondary | ICD-10-CM | POA: Diagnosis not present

## 2020-06-10 DIAGNOSIS — M67371 Transient synovitis, right ankle and foot: Secondary | ICD-10-CM | POA: Diagnosis not present

## 2020-06-10 DIAGNOSIS — M19071 Primary osteoarthritis, right ankle and foot: Secondary | ICD-10-CM | POA: Diagnosis not present

## 2020-06-17 DIAGNOSIS — M19072 Primary osteoarthritis, left ankle and foot: Secondary | ICD-10-CM | POA: Diagnosis not present

## 2020-06-17 DIAGNOSIS — M67371 Transient synovitis, right ankle and foot: Secondary | ICD-10-CM | POA: Diagnosis not present

## 2020-07-01 DIAGNOSIS — M7752 Other enthesopathy of left foot: Secondary | ICD-10-CM | POA: Diagnosis not present

## 2020-07-01 DIAGNOSIS — M7751 Other enthesopathy of right foot: Secondary | ICD-10-CM | POA: Diagnosis not present

## 2020-07-01 DIAGNOSIS — M67371 Transient synovitis, right ankle and foot: Secondary | ICD-10-CM | POA: Diagnosis not present

## 2020-07-07 ENCOUNTER — Ambulatory Visit: Payer: PPO | Admitting: Internal Medicine

## 2020-07-07 DIAGNOSIS — M1711 Unilateral primary osteoarthritis, right knee: Secondary | ICD-10-CM | POA: Diagnosis not present

## 2020-07-14 ENCOUNTER — Ambulatory Visit: Payer: PPO | Admitting: Podiatry

## 2020-07-14 DIAGNOSIS — M1711 Unilateral primary osteoarthritis, right knee: Secondary | ICD-10-CM | POA: Diagnosis not present

## 2020-07-21 DIAGNOSIS — M1711 Unilateral primary osteoarthritis, right knee: Secondary | ICD-10-CM | POA: Diagnosis not present

## 2020-07-29 DIAGNOSIS — M1711 Unilateral primary osteoarthritis, right knee: Secondary | ICD-10-CM | POA: Diagnosis not present

## 2020-08-05 DIAGNOSIS — M1711 Unilateral primary osteoarthritis, right knee: Secondary | ICD-10-CM | POA: Diagnosis not present

## 2020-08-06 ENCOUNTER — Telehealth: Payer: Self-pay | Admitting: Internal Medicine

## 2020-08-06 NOTE — Telephone Encounter (Signed)
Called pt to schedule AWV with [facility],but was unable to LVM because [facility] was full.

## 2020-08-07 IMAGING — DX DG CHEST 2V
2 series · 2 of 2 positions shown · non-contrast
Comparison: 06/29/2012

CLINICAL DATA: Cough for 1 month, mucus production

EXAM:
CHEST - 2 VIEW

[chest pa]
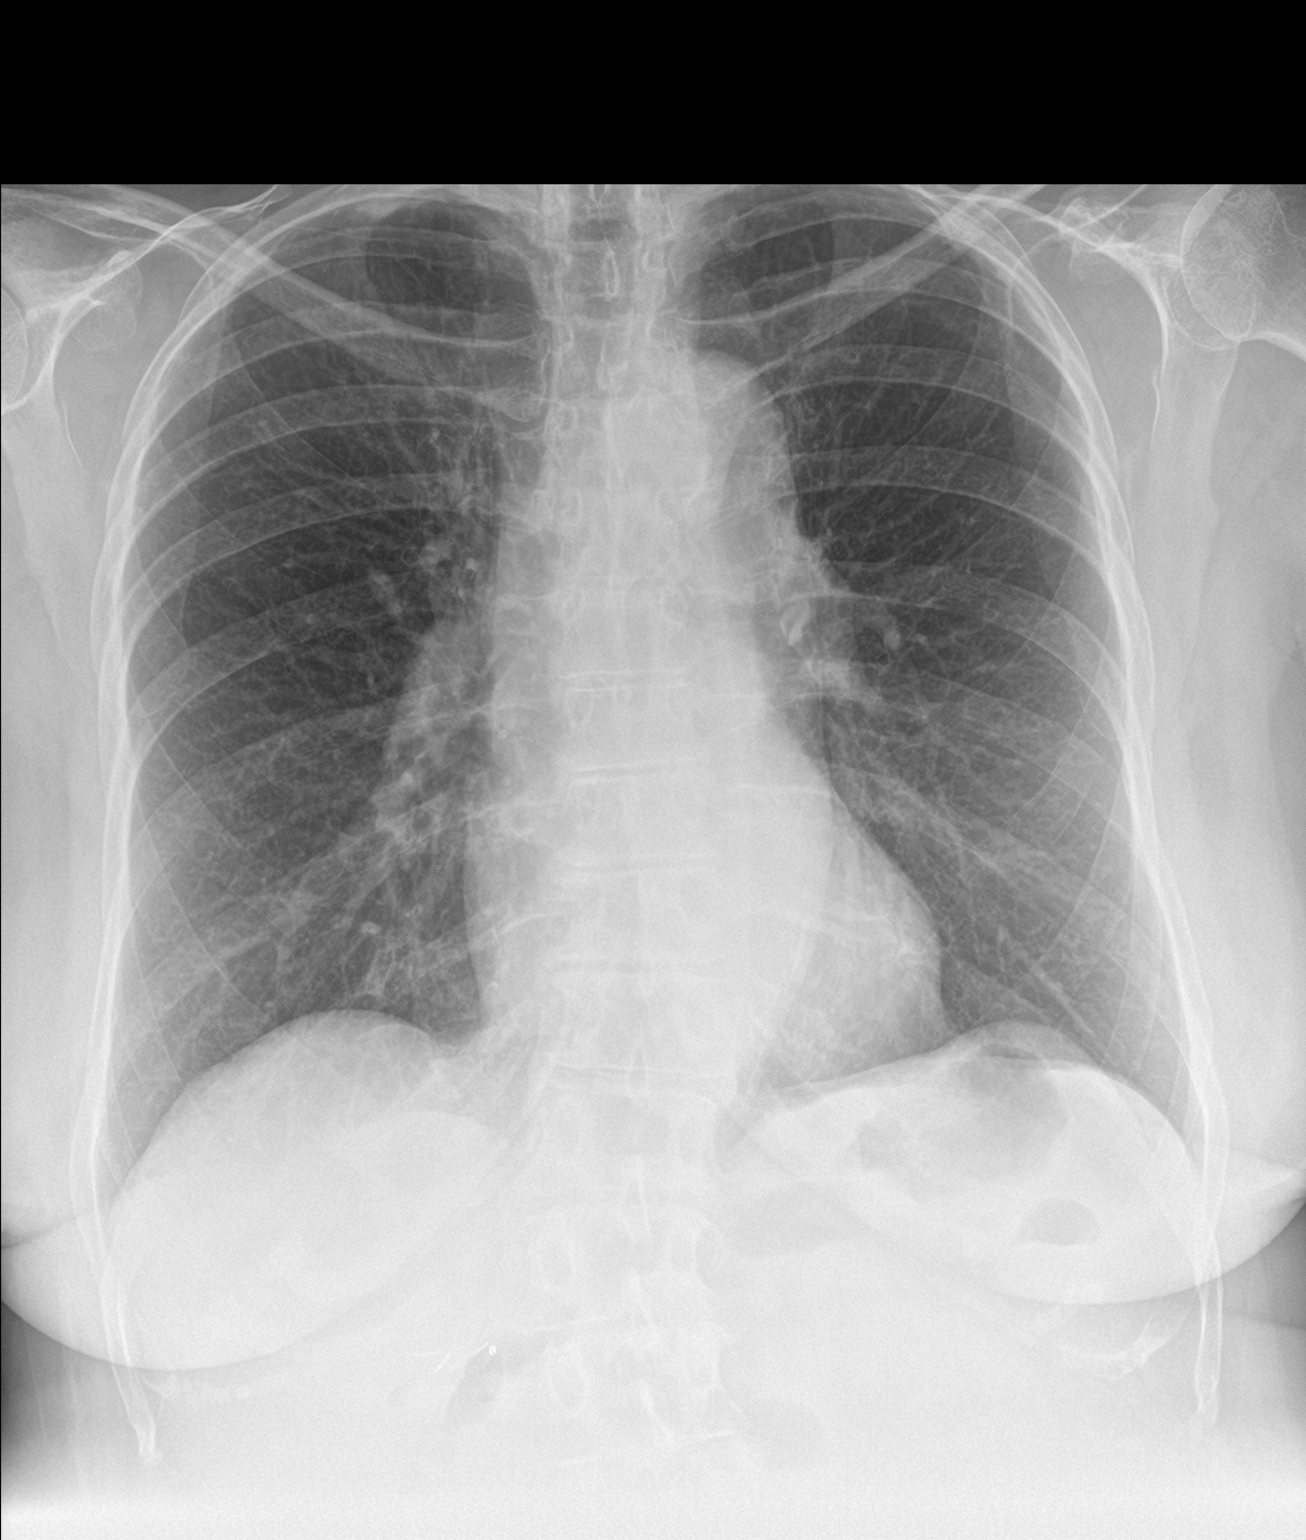

[chest lat]
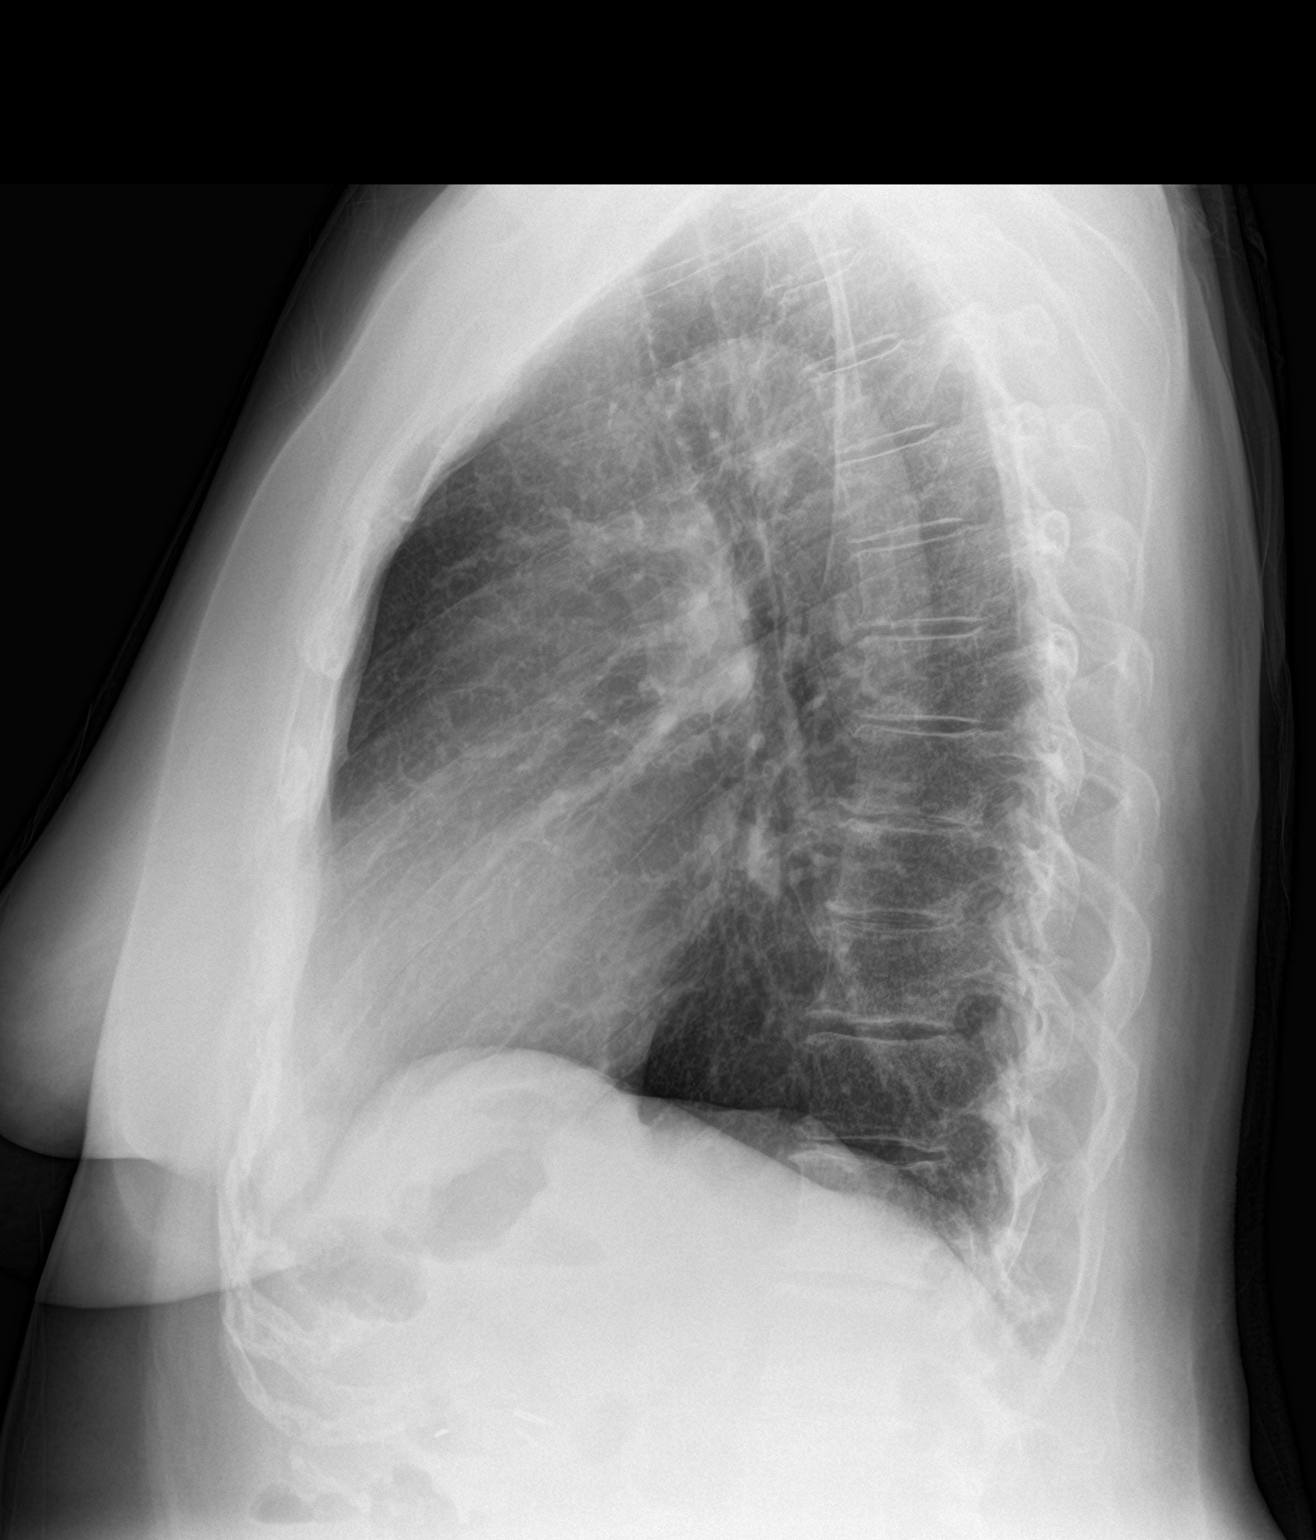

[2 of 2 positions shown; findings below may reference images not displayed]

FINDINGS: Normal heart size, mediastinal contours, and pulmonary vascularity.

Atherosclerotic calcification aorta.

Lungs clear.

No infiltrate, pleural effusion or pneumothorax.

Bones demineralized.
IMPRESSION: No acute abnormalities.

## 2020-08-11 ENCOUNTER — Encounter: Payer: Self-pay | Admitting: Internal Medicine

## 2020-08-11 ENCOUNTER — Ambulatory Visit: Payer: PPO | Admitting: Internal Medicine

## 2020-08-11 ENCOUNTER — Other Ambulatory Visit: Payer: Self-pay

## 2020-08-11 VITALS — BP 150/90 | HR 76 | Temp 98.1°F | Ht 65.0 in | Wt 180.0 lb

## 2020-08-11 NOTE — Progress Notes (Signed)
   Subjective:    Patient ID: Joan Townsend, female    DOB: [birth year], 79 y.o.   MRN: [id number]  HPI pt left without being seen    Review of Systems     Objective:   Physical Exam        Assessment & Plan:

## 2020-08-11 NOTE — Patient Instructions (Signed)
Pt left without being seen.

## 2020-08-14 ENCOUNTER — Other Ambulatory Visit: Payer: Self-pay

## 2020-08-14 ENCOUNTER — Encounter: Payer: Self-pay | Admitting: Internal Medicine

## 2020-08-14 ENCOUNTER — Ambulatory Visit (INDEPENDENT_AMBULATORY_CARE_PROVIDER_SITE_OTHER): Payer: PPO | Admitting: Internal Medicine

## 2020-08-14 ENCOUNTER — Other Ambulatory Visit: Payer: Self-pay | Admitting: Internal Medicine

## 2020-08-14 VITALS — BP 140/86 | HR 75 | Temp 98.7°F | Ht 65.0 in | Wt 179.0 lb

## 2020-08-14 DIAGNOSIS — N1831 Chronic kidney disease, stage 3a: Secondary | ICD-10-CM

## 2020-08-14 DIAGNOSIS — E538 Deficiency of other specified B group vitamins: Secondary | ICD-10-CM

## 2020-08-14 DIAGNOSIS — Z0000 Encounter for general adult medical examination without abnormal findings: Secondary | ICD-10-CM | POA: Diagnosis not present

## 2020-08-14 DIAGNOSIS — Z905 Acquired absence of kidney: Secondary | ICD-10-CM | POA: Diagnosis not present

## 2020-08-14 DIAGNOSIS — M1711 Unilateral primary osteoarthritis, right knee: Secondary | ICD-10-CM | POA: Diagnosis not present

## 2020-08-14 DIAGNOSIS — E559 Vitamin D deficiency, unspecified: Secondary | ICD-10-CM | POA: Diagnosis not present

## 2020-08-14 DIAGNOSIS — E039 Hypothyroidism, unspecified: Secondary | ICD-10-CM

## 2020-08-14 LAB — CBC WITH DIFFERENTIAL/PLATELET
Basophils Absolute: 0.1 10*3/uL (ref 0.0–0.1)
Basophils Relative: 0.9 % (ref 0.0–3.0)
Eosinophils Absolute: 0.2 10*3/uL (ref 0.0–0.7)
Eosinophils Relative: 2.5 % (ref 0.0–5.0)
HCT: 44.2 % (ref 36.0–46.0)
Hemoglobin: 14.5 g/dL (ref 12.0–15.0)
Lymphocytes Relative: 25.6 % (ref 12.0–46.0)
Lymphs Abs: 2 10*3/uL (ref 0.7–4.0)
MCHC: 32.8 g/dL (ref 30.0–36.0)
MCV: 87.3 fl (ref 78.0–100.0)
Monocytes Absolute: 0.6 10*3/uL (ref 0.1–1.0)
Monocytes Relative: 7.6 % (ref 3.0–12.0)
Neutro Abs: 4.9 10*3/uL (ref 1.4–7.7)
Neutrophils Relative %: 63.4 % (ref 43.0–77.0)
Platelets: 269 10*3/uL (ref 150.0–400.0)
RBC: 5.06 Mil/uL (ref 3.87–5.11)
RDW: 14.2 % (ref 11.5–15.5)
WBC: 7.7 10*3/uL (ref 4.0–10.5)

## 2020-08-14 LAB — URINALYSIS, ROUTINE W REFLEX MICROSCOPIC
Bilirubin Urine: NEGATIVE
Hgb urine dipstick: NEGATIVE
Ketones, ur: NEGATIVE
Leukocytes,Ua: NEGATIVE
Nitrite: NEGATIVE
RBC / HPF: NONE SEEN (ref 0–?)
Specific Gravity, Urine: 1.02 (ref 1.000–1.030)
Total Protein, Urine: NEGATIVE
Urine Glucose: NEGATIVE
Urobilinogen, UA: 0.2 (ref 0.0–1.0)
pH: 7 (ref 5.0–8.0)

## 2020-08-14 LAB — BASIC METABOLIC PANEL
BUN: 17 mg/dL (ref 6–23)
CO2: 31 mEq/L (ref 19–32)
Calcium: 10.2 mg/dL (ref 8.4–10.5)
Chloride: 103 mEq/L (ref 96–112)
Creatinine, Ser: 1.36 mg/dL — ABNORMAL HIGH (ref 0.40–1.20)
GFR: 36.47 mL/min — ABNORMAL LOW (ref >60.00)
Glucose, Bld: 98 mg/dL (ref 70–99)
Potassium: 6.4 mEq/L (ref 3.5–5.1)
Sodium: 141 mEq/L (ref 135–145)

## 2020-08-14 LAB — HEPATIC FUNCTION PANEL
ALT: 10 U/L (ref 0–35)
AST: 21 U/L (ref 0–37)
Albumin: 4 g/dL (ref 3.5–5.2)
Alkaline Phosphatase: 88 U/L (ref 39–117)
Bilirubin, Direct: 0.1 mg/dL (ref 0.0–0.3)
Total Bilirubin: 0.4 mg/dL (ref 0.2–1.2)
Total Protein: 6.9 g/dL (ref 6.0–8.3)

## 2020-08-14 LAB — LIPID PANEL
Cholesterol: 229 mg/dL — ABNORMAL HIGH (ref 0–200)
HDL: 77.4 mg/dL (ref >39.00)
LDL Cholesterol: 133 mg/dL — ABNORMAL HIGH (ref 0–99)
NonHDL: 151.89
Total CHOL/HDL Ratio: 3
Triglycerides: 96 mg/dL (ref 0.0–149.0)
VLDL: 19.2 mg/dL (ref 0.0–40.0)

## 2020-08-14 LAB — TSH: TSH: 3.42 u[IU]/mL (ref 0.35–4.50)

## 2020-08-14 LAB — T4, FREE: Free T4: 0.65 ng/dL (ref 0.60–1.60)

## 2020-08-14 LAB — VITAMIN D 25 HYDROXY (VIT D DEFICIENCY, FRACTURES): VITD: 47.66 ng/mL (ref 30.00–100.00)

## 2020-08-14 LAB — VITAMIN B12: Vitamin B-12: 491 pg/mL (ref 211–911)

## 2020-08-14 MED ORDER — EUTHYROX 150 MCG PO TABS: 150.0000 ug | ORAL_TABLET | Freq: Every day | ORAL | 3 refills | Status: DC

## 2020-08-14 NOTE — Patient Instructions (Addendum)
You are given the letter today regarding the covid vaccination mandate  Please continue all other medications as before, and refills have been done if requested - the euthyrox (brand name to [facility] - let us know if this is not correct)  Please have the pharmacy call with any other refills you may need.  Please continue your efforts at being more active, low cholesterol diet, and weight control.  You are otherwise up to date with prevention measures today.  Please keep your appointments with your specialists as you may have planned  Please go to the LAB at the blood drawing area for the tests to be done  You will be contacted by phone if any changes need to be made immediately.  Otherwise, you will receive a letter about your results with an explanation, but please check with MyChart first.  Please remember to sign up for MyChart if you have not done so, as this will be important to you in the future with finding out test results, communicating by private email, and scheduling acute appointments online when needed.  Please make an Appointment to return for your 1 year visit, or sooner if needed

## 2020-08-14 NOTE — Progress Notes (Signed)
Subjective:    Patient ID: Joan Townsend, female    DOB: May 26, 1941, 79 y.o.   MRN: [id number]  HPI  Here for wellness and f/u;  Overall doing ok;  Pt denies Chest pain, worsening SOB, DOE, wheezing, orthopnea, PND, worsening LE edema, palpitations, dizziness or syncope.  Pt denies neurological change such as new headache, facial or extremity weakness.  Pt denies polydipsia, polyuria, or low sugar symptoms. Pt states overall good compliance with treatment and medications, good tolerability, and has been trying to follow appropriate diet.  Pt denies worsening depressive symptoms, suicidal ideation or panic. No fever, night sweats, wt loss, loss of appetite, or other constitutional symptoms.  Pt states good ability with ADL's, has low fall risk, home safety reviewed and adequate, no other significant changes in hearing or vision, and only occasionally active with exercise  Did have gel shot to right knee this AM.  Denies hyper or hypo thyroid symptoms such as voice, skin or hair change. Past Medical History:  Diagnosis Date  . Acquired solitary kidney    s/p right nephrectomy  . Angio-edema   . Endometriosis   . Hemorrhoids   . Hepatitis    result of Scarlet fever  . History of scarlet fever   . Hyperlipidemia 06/29/2012  . Hypothyroidism   . HYPOTHYROIDISM 01/05/2011   Qualifier: Diagnosis of  By: Carlean Purl MD, Dimas Millin Seasonal allergies   . Urticaria    Past Surgical History:  Procedure Laterality Date  . ABDOMINAL HYSTERECTOMY    . APPENDECTOMY    . CHOLECYSTECTOMY  2012  . COLON RESECTION     part of endometriosis surgery  . COLONOSCOPY  2006   hemorrhoids(Dr. Lajoyce Corners)  . HEMORRHOID BANDING  2014  . NEPHRECTOMY     right, due to birth deformity    reports that she quit smoking about 41 years ago. Her smoking use included cigarettes. She has never used smokeless tobacco. She reports current alcohol use. She reports that she does not use drugs. family history includes Breast  cancer in an other family member; Colon cancer in an other family member; Heart disease in her maternal grandfather; Lung cancer in her sister; Ovarian cancer in an other family member; Uterine cancer in her mother. Allergies  Allergen Reactions  . Ciprofloxacin Rash    REACTION: rash REACTION: rash  . Clindamycin/Lincomycin Anaphylaxis  . Penicillins Rash    REACTION: rash REACTION: rash  . Sulfa Antibiotics     Hives  . Sulfacetamide Sodium Hives    Hives  . Cefaclor   . Naproxen Other (See Comments)    Facial swelling   Current Outpatient Medications on File Prior to Visit  Medication Sig Dispense Refill  . Ascorbic Acid (VITAMIN C) 1000 MG tablet Take 1,000 mg by mouth daily.    Marland Kitchen azelastine (ASTELIN) 0.1 % nasal spray One spray per nostril on Sunday/Tuesday/Thursday/ and Saturday 30 mL 5  . Cholecalciferol (VITAMIN D-3) 5000 UNITS TABS Take by mouth daily.    . Coenzyme Q10 (CO Q 10) 100 MG CAPS Take 1 capsule by mouth daily.    Marland Kitchen estradiol (ESTRACE) 1 MG tablet Take 1 tablet (1 mg total) by mouth daily. 90 tablet 3  . fluticasone (FLONASE) 50 MCG/ACT nasal spray One spray per nostril on Monday/Wednesday/ Friday 16 g 5  . hydrocortisone (ANUSOL-HC) 2.5 % rectal cream Apply small amount of cream to the suppository and insert in the rectum at bedtime as needed. [place]  g 1  . hydrocortisone (ANUSOL-HC) 25 MG suppository Place 1 suppository in the rectum at bedtime. 12 suppository 3  . Magnesium 100 MG CAPS Take by mouth.    [name] 150 MG CAPS Take by mouth.    Marland Kitchen POTASSIUM IODIDE, ANTIDOTE, PO Take by mouth daily.    . vitamin B-12 (CYANOCOBALAMIN) 1000 MCG tablet Take 1,000 mcg by mouth daily.     No current facility-administered medications on file prior to visit.   Review of Systems All otherwise neg per pt    Objective:   Physical Exam BP 140/86 (BP Location: Left Arm, Patient Position: Sitting, Cuff Size: Large)   Pulse 75   Temp 98.7 F (37.1 C) (Oral)    Ht 5\' 5"  (1.651 m)   Wt 179 lb (81.2 kg)   SpO2 96%   BMI 29.79 kg/m  VS noted,  Constitutional: Pt appears in NAD HENT: Head: NCAT.  Right Ear: External ear normal.  Left Ear: External ear normal.  Eyes: . Pupils are equal, round, and reactive to light. Conjunctivae and EOM are normal Nose: without d/c or deformity Neck: Neck supple. Gross normal ROM Cardiovascular: Normal rate and regular rhythm.   Pulmonary/Chest: Effort normal and breath sounds without rales or wheezing.  Abd:  Soft, NT, ND, + BS, no organomegaly Neurological: Pt is alert. At baseline orientation, motor grossly intact Skin: Skin is warm. No rashes, other new lesions, no LE edema Psychiatric: Pt behavior is normal without agitation  All otherwise neg per pt Lab Results  Component Value Date   WBC 7.7 08/14/2020   HGB 14.5 08/14/2020   HCT 44.2 08/14/2020   PLT 269.0 08/14/2020   GLUCOSE 98 08/14/2020   CHOL 229 (H) 08/14/2020   TRIG 96.0 08/14/2020   HDL 77.40 08/14/2020   LDLDIRECT 121.5 06/29/2012   LDLCALC 133 (H) 08/14/2020   ALT 10 08/14/2020   AST 21 08/14/2020   NA 141 08/14/2020   K 6.4 No hemolysis seen (HH) 08/14/2020   CL 103 08/14/2020   CREATININE 1.36 (H) 08/14/2020   BUN 17 08/14/2020   CO2 31 08/14/2020   TSH 3.42 08/14/2020   INR 1.0 11/07/2018      Assessment & Plan:

## 2020-08-15 ENCOUNTER — Telehealth: Payer: Self-pay | Admitting: Internal Medicine

## 2020-08-15 DIAGNOSIS — Z905 Acquired absence of kidney: Secondary | ICD-10-CM

## 2020-08-15 NOTE — Telephone Encounter (Signed)
Patient requesting someone give her a call to further discuss her lab results.  Patient # [phone]

## 2020-08-18 NOTE — Telephone Encounter (Signed)
LDVM for pt of Dr. Gwynn Burly note about pts labs and to please return to the [facility] for labs to be redone.

## 2020-08-19 NOTE — Telephone Encounter (Signed)
Patient says shes aware she needs to get these test done but wants to talk to someone before she gets them done, and if she has kidney issues she wants to go to her kidney specialist.  Patient # [phone]

## 2020-08-20 ENCOUNTER — Encounter: Payer: Self-pay | Admitting: Internal Medicine

## 2020-08-20 NOTE — Assessment & Plan Note (Signed)
stable overall by history and exam, recent data reviewed with pt, and pt to continue medical treatment as before,  to f/u any worsening symptoms or concerns  

## 2020-08-20 NOTE — Assessment & Plan Note (Signed)

## 2020-08-20 NOTE — Assessment & Plan Note (Signed)
For lab f/u, refill med

## 2020-08-21 NOTE — Telephone Encounter (Signed)
Ok order done

## 2020-08-21 NOTE — Telephone Encounter (Signed)
Spoke with pt and was able to inform her on reasons why she needs to repeat labs.

## 2020-08-21 NOTE — Addendum Note (Signed)
Addended by: Biagio Borg on: 08/21/2020 05:38 PM   Modules accepted: Orders

## 2020-08-26 DIAGNOSIS — Z1231 Encounter for screening mammogram for malignant neoplasm of breast: Secondary | ICD-10-CM | POA: Diagnosis not present

## 2020-08-26 DIAGNOSIS — E7800 Pure hypercholesterolemia, unspecified: Secondary | ICD-10-CM | POA: Diagnosis not present

## 2020-08-26 DIAGNOSIS — Z01419 Encounter for gynecological examination (general) (routine) without abnormal findings: Secondary | ICD-10-CM | POA: Diagnosis not present

## 2020-08-26 DIAGNOSIS — E039 Hypothyroidism, unspecified: Secondary | ICD-10-CM | POA: Diagnosis not present

## 2020-08-28 ENCOUNTER — Telehealth: Payer: Self-pay | Admitting: Internal Medicine

## 2020-08-28 NOTE — Telephone Encounter (Signed)
Patient states when she came in for her visit her temperature and BP was wrong as well as her Lab results had an error so she wanted to talk to someone about that.  Patient # [id number]

## 2020-08-29 NOTE — Telephone Encounter (Signed)
Was able to speak with pt and she has stated she went to her gyn visit and stated she had her labs redone there so she will not be coming back to get her labs done at our office.  Pt was informed to please bring a copy of her labs to the clinic so we can have them on file.

## 2020-09-10 ENCOUNTER — Encounter: Payer: Self-pay | Admitting: Internal Medicine

## 2020-09-10 ENCOUNTER — Ambulatory Visit: Payer: PPO | Admitting: Internal Medicine

## 2020-09-10 VITALS — BP 130/82 | HR 82 | Ht 65.0 in | Wt 180.0 lb

## 2020-09-10 DIAGNOSIS — K644 Residual hemorrhoidal skin tags: Secondary | ICD-10-CM | POA: Diagnosis not present

## 2020-09-10 DIAGNOSIS — K591 Functional diarrhea: Secondary | ICD-10-CM | POA: Diagnosis not present

## 2020-09-10 MED ORDER — HYDROCORTISONE (PERIANAL) 2.5 % EX CREA: TOPICAL_CREAM | CUTANEOUS | 1 refills | Status: DC

## 2020-09-10 MED ORDER — HYDROCORTISONE ACETATE 25 MG RE SUPP
RECTAL | 3 refills | Status: DC
Start: 2020-09-10 — End: 2023-05-16

## 2020-09-10 NOTE — Progress Notes (Signed)
Joan Townsend 79 y.o. 1941/09/23 [id number]  Assessment & Plan:   Encounter Diagnoses  Name Primary?  . Bleeding external hemorrhoids Yes  . Functional diarrhea     I think she is bleeding from her external hemorrhoids at this time so banding is not a good option.  We will treat with hydrocortisone topical in the form of suppositories and/or cream.  Follow-up as needed.  CC: Joan Borg, MD   Subjective:   Chief Complaint: Rectal bleeding  HPI 79 year old white woman with a history of bleeding hemorrhoids treated with hemorrhoidal banding in the past, had done well for a long time but occasionally has some bright red blood on the tissue paper typically when bowels "act up".  She recently had a stressful situation which caused some transient diarrhea which will happen when she is emotionally upset at times.  She started to having bright red blood on the tissue paper again which is subsiding at this time.  Bowel habits are back to normal.  The patient has had a colonoscopy in the past but had declined follow-up exam.  There is no sign of hematochezia just bright red blood on the tissue paper.   Allergies  Allergen Reactions  . Ciprofloxacin Rash    REACTION: rash REACTION: rash  . Clindamycin/Lincomycin Anaphylaxis  . Penicillins Rash    REACTION: rash REACTION: rash  . Sulfa Antibiotics     Hives  . Sulfacetamide Sodium Hives    Hives  . Cefaclor   . Naproxen Other (See Comments)    Facial swelling   Current Meds  Medication Sig  . Ascorbic Acid (VITAMIN C) 1000 MG tablet Take 1,000 mg by mouth daily.  Marland Kitchen azelastine (ASTELIN) 0.1 % nasal spray One spray per nostril on Sunday/Tuesday/Thursday/ and Saturday  . Cholecalciferol (VITAMIN D-3) 5000 UNITS TABS Take by mouth daily.  . Coenzyme Q10 (CO Q 10) 100 MG CAPS Take 1 capsule by mouth daily.  Marland Kitchen estradiol (ESTRACE) 1 MG tablet Take 1 tablet (1 mg total) by mouth daily.  . fluticasone (FLONASE) 50 MCG/ACT nasal  spray One spray per nostril on Monday/Wednesday/ Friday  . hydrocortisone (ANUSOL-HC) 2.5 % rectal cream Apply small amount of cream to the suppository and insert in the rectum at bedtime as needed.  . hydrocortisone (ANUSOL-HC) 25 MG suppository Place 1 suppository in the rectum at bedtime.  Marland Kitchen levothyroxine (SYNTHROID) 175 MCG tablet Take 175 mcg by mouth daily.  . Magnesium 100 MG CAPS Take by mouth.  [name] 150 MG CAPS Take by mouth.  . vitamin B-12 (CYANOCOBALAMIN) 1000 MCG tablet Take 1,000 mcg by mouth daily.  . [DISCONTINUED] hydrocortisone (ANUSOL-HC) 25 MG suppository Place 1 suppository in the rectum at bedtime.   Past Medical History:  Diagnosis Date  . Acquired solitary kidney    s/p right nephrectomy  . Angio-edema   . Endometriosis   . Hemorrhoids   . Hepatitis    result of Scarlet fever  . History of scarlet fever   . Hyperlipidemia 06/29/2012  . Hypothyroidism   . HYPOTHYROIDISM 01/05/2011   Qualifier: Diagnosis of  By: Carlean Purl MD, Dimas Millin Seasonal allergies   . Urticaria    Past Surgical History:  Procedure Laterality Date  . ABDOMINAL HYSTERECTOMY    . APPENDECTOMY    . CHOLECYSTECTOMY  2012  . COLON RESECTION     part of endometriosis surgery  . COLONOSCOPY  2006   hemorrhoids(Dr. Lajoyce Corners)  .  HEMORRHOID BANDING  2014  . NEPHRECTOMY     right, due to birth deformity    family history includes Breast cancer in an other family member; Colon cancer in an other family member; Heart disease in her maternal grandfather; Lung cancer in her sister; Ovarian cancer in an other family member; Uterine cancer in her mother.   Review of Systems As per HPI  Objective:   Physical Exam BP 130/82   Pulse 82   Ht 5\' 5"  (1.651 m)   Wt 180 lb (81.6 kg)   BMI 29.95 kg/m     Joan Townsend, CMA present.   Rectal exam demonstrates multiple fleshy anal tags, no perianal dermatitis.  Digital exam shows a somewhat narrowed anal canal slightly tender no  evidence of fissure no mass.  Anoscopy demonstrates inflamed grade 1 external hemorrhoids in the canal.  Violaceous with stigmata of bleeding.  Grade 1 internal hemorrhoids all 3 positions no inflammatory changes.

## 2020-09-10 NOTE — Patient Instructions (Signed)
We have sent the following medications to your pharmacy for you to pick up at your convenience: Anusol HC cream and suppositories  Follow up with Dr Carlean Purl as needed.   I appreciate the opportunity to care for you. Silvano Rusk, MD, [facility]

## 2020-10-09 DIAGNOSIS — E039 Hypothyroidism, unspecified: Secondary | ICD-10-CM | POA: Diagnosis not present

## 2020-10-09 DIAGNOSIS — R946 Abnormal results of thyroid function studies: Secondary | ICD-10-CM | POA: Diagnosis not present

## 2020-11-07 IMAGING — CT CT ABD-PELV W/ CM
2 of 5 series · 16 of 46 positions shown, 18 images · IV contrast (ISOVUE 300)
Comparison: None.

CLINICAL DATA: [DATE] week history of umbilical and left mid abdominal
spontaneous ecchymosis. Associated tenderness.

EXAM:
CT ABDOMEN AND PELVIS WITH CONTRAST
TECHNIQUE: Multidetector CT imaging of the abdomen and pelvis was performed
using the standard protocol following bolus administration of
intravenous contrast.
CONTRAST:  100mL 2UDHHK-YFF IOPAMIDOL (2UDHHK-YFF) INJECTION 61%

[Series 2: abd/pel w · axial · 0.72mm/px · z∈[+995,+1360]mm · 13 of 83 slices shown, 15 images]
[im 5/83  soft-tissue]
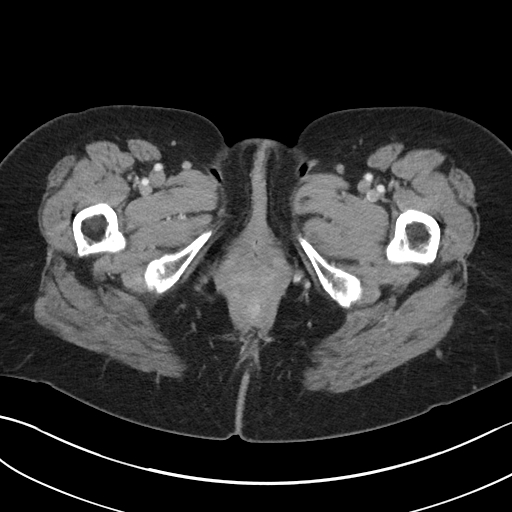
[im 5/83  bone]
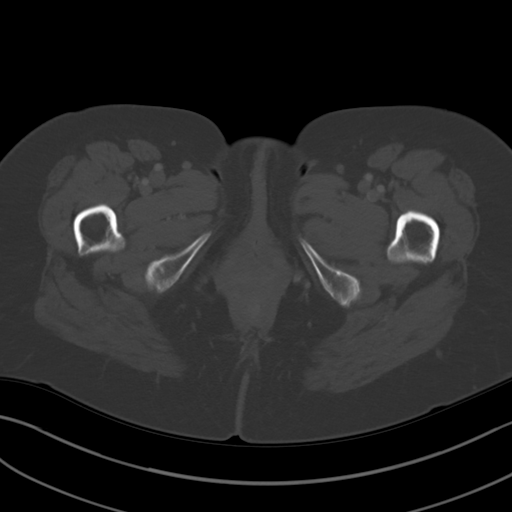
[im 13/83  soft-tissue]
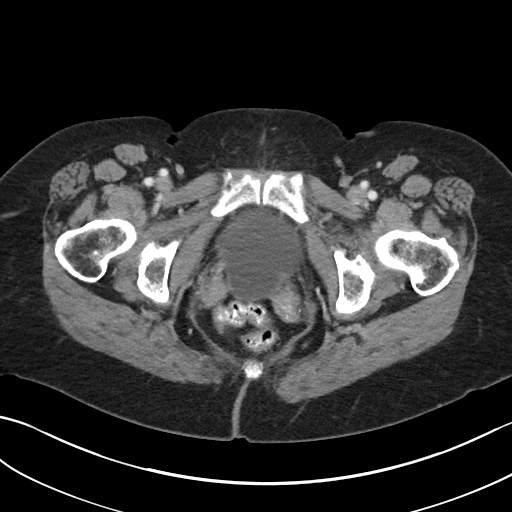
[im 18/83  soft-tissue]
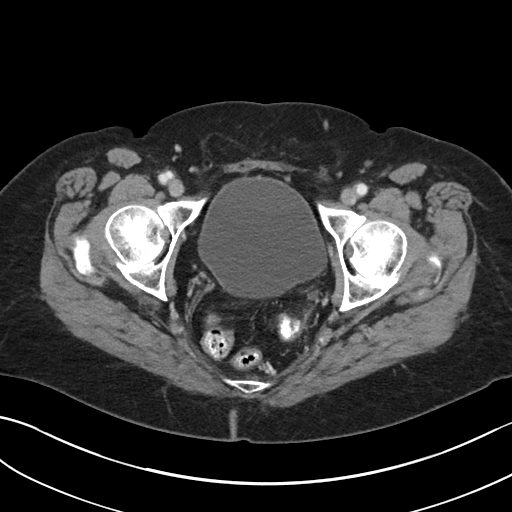
[im 22/83  soft-tissue]
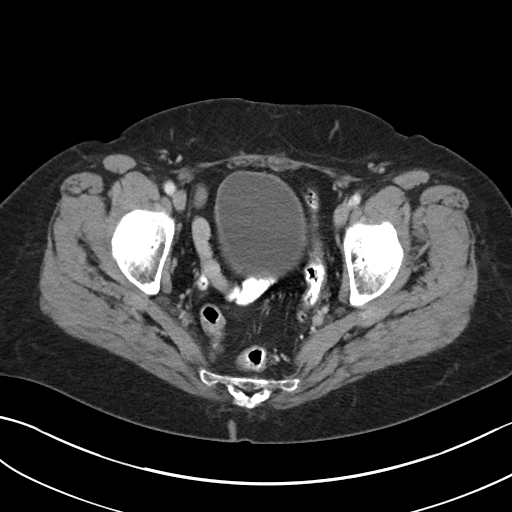
[im 31/83  soft-tissue]
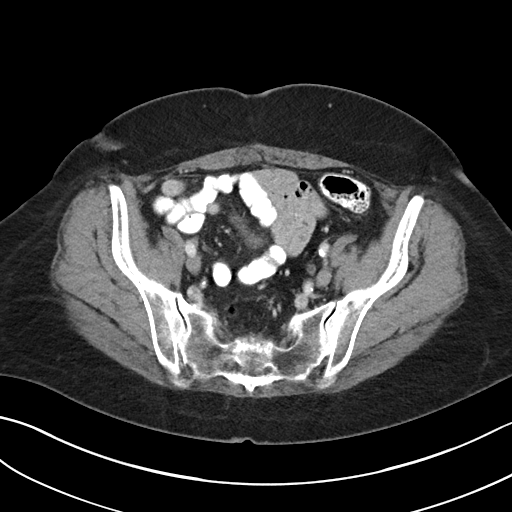
[im 35/83  soft-tissue]
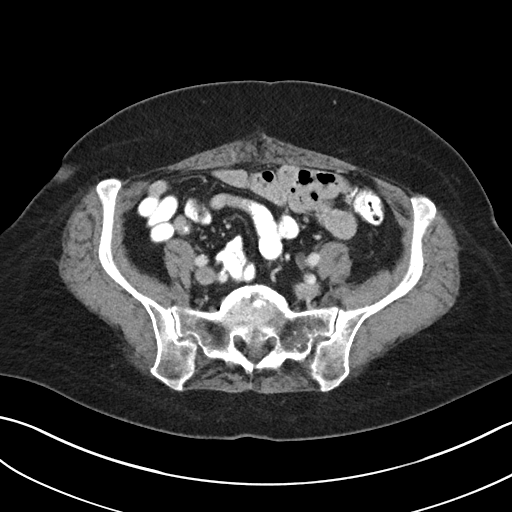
[im 44/83  soft-tissue]
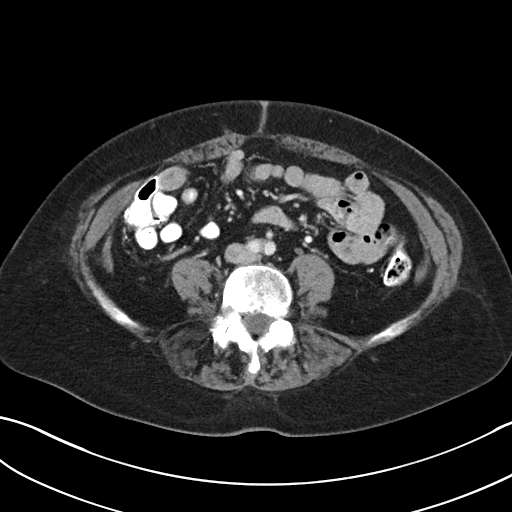
[im 48/83  soft-tissue]
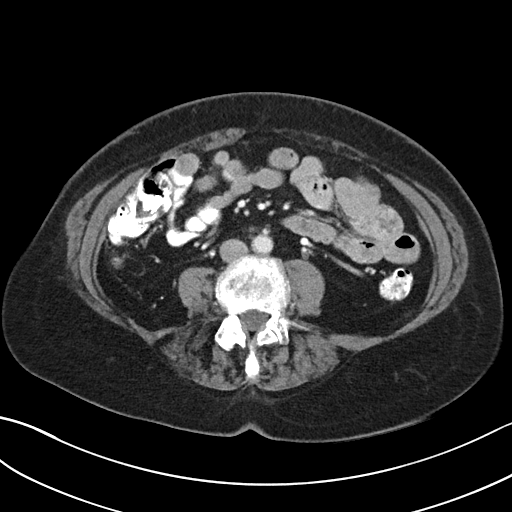
[im 52/83  soft-tissue]
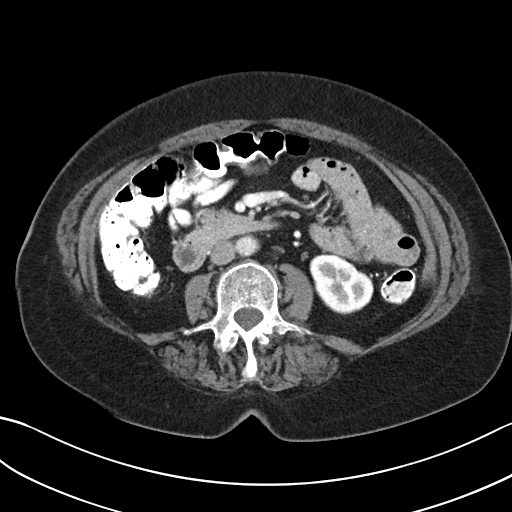
[im 52/83  bone]
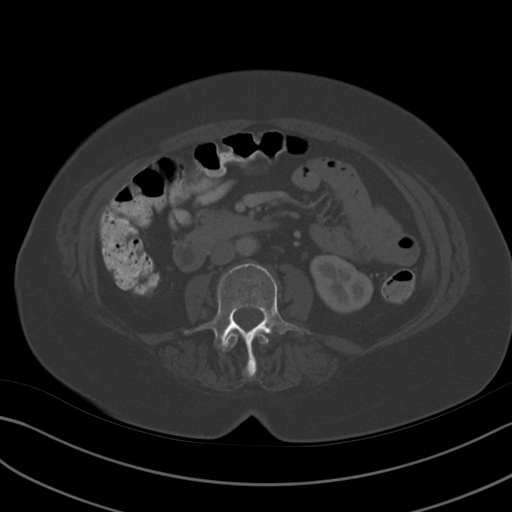
[im 61/83  soft-tissue]
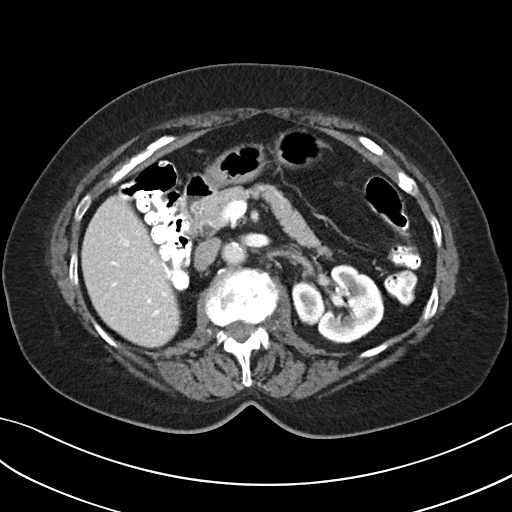
[im 65/83  soft-tissue]
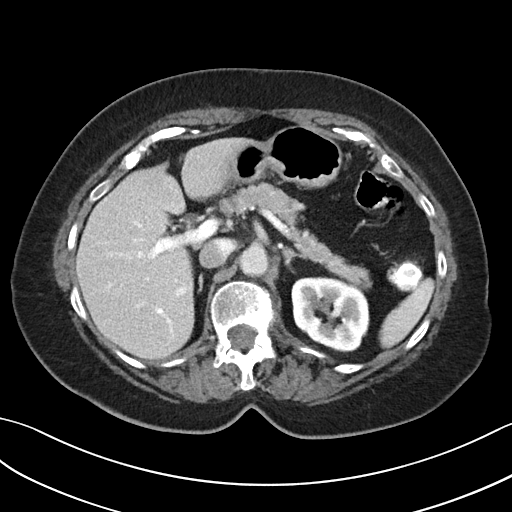
[im 70/83  soft-tissue]
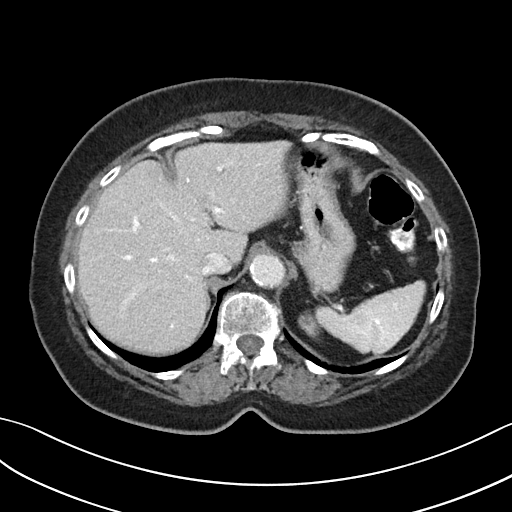
[im 78/83  soft-tissue]
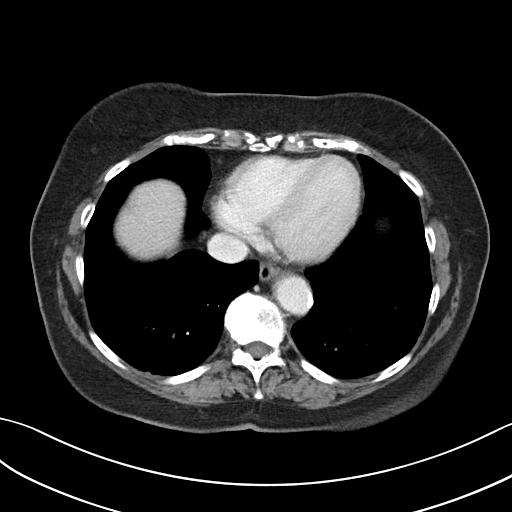

[Series 5: abd/pel w st · coronal · 0.73mm/px · 3 of 87 slices shown]
[im 29/87  soft-tissue]
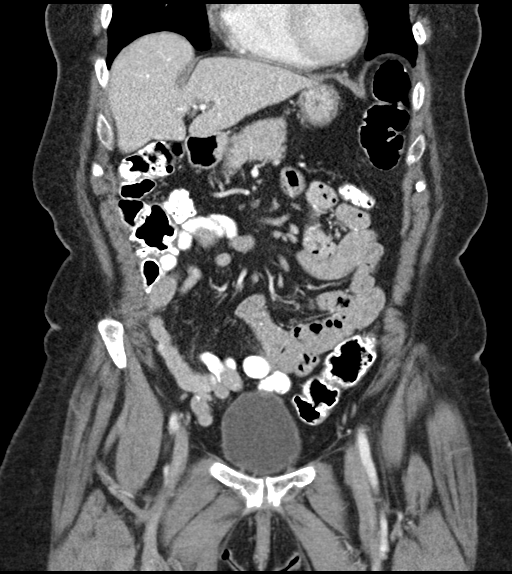
[im 39/87  soft-tissue]
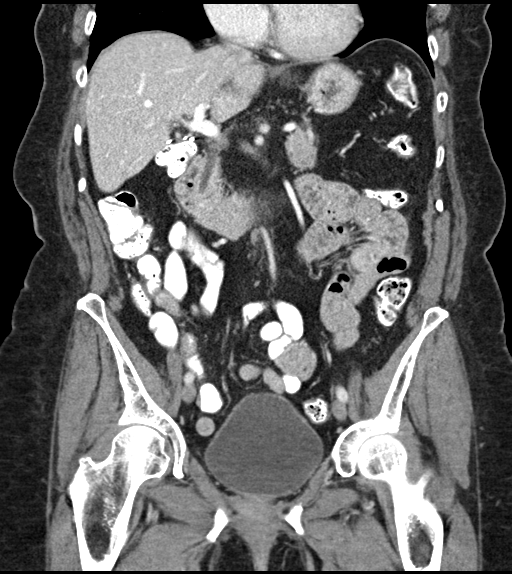
[im 48/87  soft-tissue]
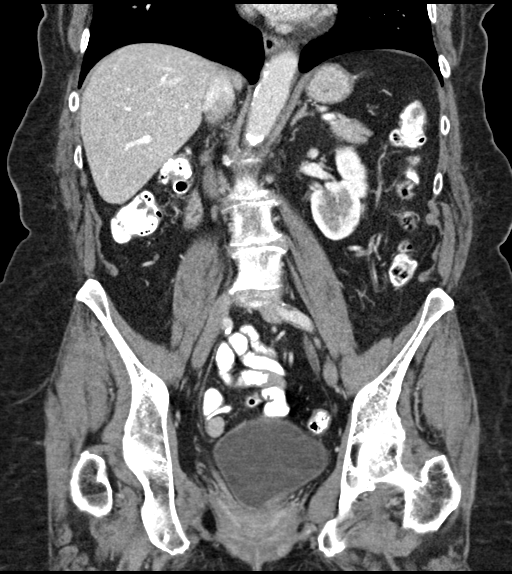

[16 of 46 positions shown; findings below may reference images not displayed]

FINDINGS: Lower chest: Lung bases are clear. Heart size normal. No pericardial
or pleural effusion. Distal esophagus is grossly unremarkable.

Hepatobiliary: Liver is unremarkable. Cholecystectomy. No biliary
ductal dilatation.

Pancreas: Negative.

Spleen: Negative.

Adrenals/Urinary Tract: Adrenal glands are unremarkable. Right
nephrectomy. Small stone in the left kidney. Left kidney is
otherwise unremarkable. Mild prominence of the left ureter, without
cause identified. There may be a small cystocele.

Stomach/Bowel: Stomach, small bowel and colon are unremarkable.
Appendectomy by report.

Vascular/Lymphatic: Atherosclerotic calcification of the aorta
without aneurysm. No pathologically enlarged lymph nodes.

Reproductive: Hysterectomy.  No adnexal mass.

Other: No free fluid.  Mesenteries and peritoneum are unremarkable.

Musculoskeletal: Degenerative changes in the spine. No worrisome
lytic or sclerotic lesions.
IMPRESSION: 1. No findings to explain the patient's given history.
2. Left renal stone. Left ureter is mildly prominent without cause
identified.
3.  Aortic atherosclerosis (FRSTE-170.0).

## 2020-11-24 DIAGNOSIS — M1711 Unilateral primary osteoarthritis, right knee: Secondary | ICD-10-CM | POA: Diagnosis not present

## 2020-11-27 ENCOUNTER — Telehealth: Payer: Self-pay | Admitting: Internal Medicine

## 2020-11-27 NOTE — Telephone Encounter (Signed)
INCOMING RECORDS FORWARD TO DR. Jenny Reichmann, 3 PGS 11/27/20, Joan Townsend

## 2021-04-09 ENCOUNTER — Other Ambulatory Visit: Payer: Self-pay

## 2021-04-09 ENCOUNTER — Ambulatory Visit (INDEPENDENT_AMBULATORY_CARE_PROVIDER_SITE_OTHER): Payer: PPO

## 2021-04-09 ENCOUNTER — Ambulatory Visit (INDEPENDENT_AMBULATORY_CARE_PROVIDER_SITE_OTHER): Payer: PPO | Admitting: Internal Medicine

## 2021-04-09 ENCOUNTER — Encounter: Payer: Self-pay | Admitting: Internal Medicine

## 2021-04-09 VITALS — BP 130/80 | HR 84 | Temp 98.0°F | Ht 65.0 in | Wt 166.0 lb

## 2021-04-09 DIAGNOSIS — R509 Fever, unspecified: Secondary | ICD-10-CM | POA: Insufficient documentation

## 2021-04-09 DIAGNOSIS — M545 Low back pain, unspecified: Secondary | ICD-10-CM

## 2021-04-09 DIAGNOSIS — R350 Frequency of micturition: Secondary | ICD-10-CM

## 2021-04-09 DIAGNOSIS — R5383 Other fatigue: Secondary | ICD-10-CM | POA: Diagnosis not present

## 2021-04-09 DIAGNOSIS — R609 Edema, unspecified: Secondary | ICD-10-CM

## 2021-04-09 LAB — POC URINALSYSI DIPSTICK (AUTOMATED)
Bilirubin, UA: NEGATIVE
Blood, UA: NEGATIVE
Glucose, UA: NEGATIVE
Ketones, UA: NEGATIVE
Leukocytes, UA: NEGATIVE
Nitrite, UA: NEGATIVE
Protein, UA: NEGATIVE
Spec Grav, UA: 1.02 (ref 1.010–1.025)
Urobilinogen, UA: 0.2 E.U./dL
pH, UA: 6 (ref 5.0–8.0)

## 2021-04-09 MED ORDER — HYDROCHLOROTHIAZIDE 12.5 MG PO CAPS: 12.5000 mg | ORAL_CAPSULE | Freq: Every day | ORAL | 3 refills | Status: DC | PRN

## 2021-04-09 NOTE — Patient Instructions (Signed)
The initial urine dip was negative  Please take all new medication as prescribed - the low dose hct fluid pill only if needed for swelling  Please continue all other medications as before, and refills have been done if requested.  Please have the pharmacy call with any other refills you may need.  Please continue your efforts at being more active, low cholesterol diet, and weight control.  Please keep your appointments with your specialists as you may have planned  Please go to the XRAY Department in the first floor for the x-ray testing  Please go to the LAB at the blood drawing area for the tests to be done - the full urine testing  You will be contacted by phone if any changes need to be made immediately.  Otherwise, you will receive a letter about your results with an explanation, but please check with MyChart first.  Please remember to sign up for MyChart if you have not done so, as this will be important to you in the future with finding out test results, communicating by private email, and scheduling acute appointments online when needed.

## 2021-04-09 NOTE — Progress Notes (Signed)
Patient ID: Joan Townsend, female   DOB: 03-May-1941, 80 y.o.   MRN: [id number]        Chief Complaint: follow up subjective low grade temp and right low back pain       HPI:  Joan Townsend is a 80 y.o. female here with c/o above as well as faitgue, convinced she may have UTI, but Denies urinary symptoms such as dysuria, frequency, urgency, flank pain, hematuria or n/v, fever, chills.  Pt denies chest pain, increased sob or doe, wheezing, orthopnea, PND, palpitations, dizziness or syncope, but has had slight mild leg swelling new onset in the last months or so, better in the am, worse in the pm.   Pt denies polydipsia, polyuria, or new focal neuro s/s  Pt c/o 3 days right lateral LBP without change in severity, bowel or bladder change, fever, wt loss,  worsening LE pain/numbness/weakness, gait change or falls.   Pt still working as Museum/gallery curator Readings from Last 3 Encounters:  04/09/21 166 lb (75.3 kg)  09/10/20 180 lb (81.6 kg)  08/14/20 179 lb (81.2 kg)   BP Readings from Last 3 Encounters:  04/09/21 130/80  09/10/20 130/82  08/14/20 140/86         Past Medical History:  Diagnosis Date   Acquired solitary kidney    s/p right nephrectomy   Angio-edema    Endometriosis    Hemorrhoids    Hepatitis    result of Scarlet fever   History of scarlet fever    Hyperlipidemia 06/29/2012   Hypothyroidism    HYPOTHYROIDISM 01/05/2011   Qualifier: Diagnosis of  By: Carlean Purl MD, Dimas Millin    Seasonal allergies    Urticaria    Past Surgical History:  Procedure Laterality Date   ABDOMINAL HYSTERECTOMY     APPENDECTOMY     CHOLECYSTECTOMY  2012   COLON RESECTION     part of endometriosis surgery   COLONOSCOPY  2006   hemorrhoids(Dr. Lajoyce Corners)   HEMORRHOID BANDING  2014   NEPHRECTOMY     right, due to birth deformity    reports that she quit smoking about 42 years ago. Her smoking use included cigarettes. She has never used smokeless tobacco. She reports current alcohol use. She  reports that she does not use drugs. family history includes Breast cancer in an other family member; Colon cancer in an other family member; Heart disease in her maternal grandfather; Lung cancer in her sister; Ovarian cancer in an other family member; Uterine cancer in her mother. Allergies  Allergen Reactions   Ciprofloxacin Rash    REACTION: rash REACTION: rash   Clindamycin/Lincomycin Anaphylaxis   Penicillins Rash    REACTION: rash REACTION: rash   Sulfa Antibiotics     Hives   Sulfacetamide Sodium Hives    Hives   Cefaclor    Naproxen Other (See Comments)    Facial swelling   Current Outpatient Medications on File Prior to Visit  Medication Sig Dispense Refill   Ascorbic Acid (VITAMIN C) 1000 MG tablet Take 1,000 mg by mouth daily.     azelastine (ASTELIN) 0.1 % nasal spray One spray per nostril on Sunday/Tuesday/Thursday/ and Saturday 30 mL 5   Cholecalciferol (VITAMIN D-3) 5000 UNITS TABS Take by mouth daily.     Coenzyme Q10 (CO Q 10) 100 MG CAPS Take 1 capsule by mouth daily.     estradiol (ESTRACE) 1 MG tablet Take 1 tablet (1 mg  total) by mouth daily. 90 tablet 3   fluticasone (FLONASE) 50 MCG/ACT nasal spray One spray per nostril on Monday/Wednesday/ Friday 16 g 5   hydrocortisone (ANUSOL-HC) 2.5 % rectal cream Apply a small amount of cream to suppository and insert to rectum at bedtime as needed. 30 g 1   hydrocortisone (ANUSOL-HC) 25 MG suppository Place 1 suppository in the rectum at bedtime. 12 suppository 3   levothyroxine (SYNTHROID) 175 MCG tablet Take 175 mcg by mouth daily.     Magnesium 100 MG CAPS Take by mouth.     Olive Leaf Extract 150 MG CAPS Take by mouth.     vitamin B-12 (CYANOCOBALAMIN) 1000 MCG tablet Take 1,000 mcg by mouth daily.     No current facility-administered medications on file prior to visit.        ROS:  All others reviewed and negative.  Objective        PE:  BP 130/80 (BP Location: Left Arm, Patient Position: Sitting, Cuff Size:  Normal)   Pulse 84   Temp 98 F (36.7 C) (Oral)   Ht 5\' 5"  (1.651 m)   Wt 166 lb (75.3 kg)   SpO2 98%   BMI 27.62 kg/m                 Constitutional: Pt appears in NAD               HENT: Head: NCAT.                Right Ear: External ear normal.                 Left Ear: External ear normal.                Eyes: . Pupils are equal, round, and reactive to light. Conjunctivae and EOM are normal               Nose: without d/c or deformity               Neck: Neck supple. Gross normal ROM               Cardiovascular: Normal rate and regular rhythm.                 Pulmonary/Chest: Effort normal and breath sounds without rales or wheezing.                Abd:  Soft, NT, ND, + BS, no organomegaly; right lateral back tender above the right ischium               Neurological: Pt is alert. At baseline orientation, motor grossly intact               Skin: Skin is warm. No rashes, no other new lesions, LE edema - trace pedal               Psychiatric: Pt behavior is normal without agitation   Micro: none  Cardiac tracings I have personally interpreted today:  none  Pertinent Radiological findings (summarize): none   Lab Results  Component Value Date   WBC 7.7 08/14/2020   HGB 14.5 08/14/2020   HCT 44.2 08/14/2020   PLT 269.0 08/14/2020   GLUCOSE 98 08/14/2020   CHOL 229 (H) 08/14/2020   TRIG 96.0 08/14/2020   HDL 77.40 08/14/2020   LDLDIRECT 121.5 06/29/2012   LDLCALC 133 (H) 08/14/2020   ALT 10 08/14/2020   AST 21 08/14/2020  NA 141 08/14/2020   K 6.4 No hemolysis seen (HH) 08/14/2020   CL 103 08/14/2020   CREATININE 1.36 (H) 08/14/2020   BUN 17 08/14/2020   CO2 31 08/14/2020   TSH 3.42 08/14/2020   INR 1.0 11/07/2018   Assessment/Plan:  Joan Townsend is a 80 y.o. White or Caucasian [1] female with  has a past medical history of Acquired solitary kidney, Angio-edema, Endometriosis, Hemorrhoids, Hepatitis, History of scarlet fever, Hyperlipidemia (06/29/2012),  Hypothyroidism, HYPOTHYROIDISM (01/05/2011), Seasonal allergies, and Urticaria.  Fever Subjective, ok for UA and cxr, with tx pending results  Low back pain Mild, acute I suspect c/s msk but ok for urine studies as per pt request  Peripheral edema C/w likely venous insufficiency, for hct 12.5 qd prn, leg elevation, low salt, wt control  Followup: Return if symptoms worsen or fail to improve.  Cathlean Cower, MD 04/12/2021 4:27 PM [facility]

## 2021-04-10 ENCOUNTER — Encounter: Payer: Self-pay | Admitting: Internal Medicine

## 2021-04-10 LAB — URINALYSIS, ROUTINE W REFLEX MICROSCOPIC
Bilirubin Urine: NEGATIVE
Hgb urine dipstick: NEGATIVE
Ketones, ur: NEGATIVE
Leukocytes,Ua: NEGATIVE
Nitrite: NEGATIVE
Specific Gravity, Urine: 1.015 (ref 1.000–1.030)
Total Protein, Urine: NEGATIVE
Urine Glucose: NEGATIVE
Urobilinogen, UA: 0.2 (ref 0.0–1.0)
pH: 6 (ref 5.0–8.0)

## 2021-04-10 LAB — URINE CULTURE

## 2021-04-12 ENCOUNTER — Encounter: Payer: Self-pay | Admitting: Internal Medicine

## 2021-04-12 DIAGNOSIS — M545 Low back pain, unspecified: Secondary | ICD-10-CM | POA: Insufficient documentation

## 2021-04-12 DIAGNOSIS — R609 Edema, unspecified: Secondary | ICD-10-CM | POA: Insufficient documentation

## 2021-04-12 NOTE — Assessment & Plan Note (Signed)
C/w likely venous insufficiency, for hct 12.5 qd prn, leg elevation, low salt, wt control

## 2021-04-12 NOTE — Assessment & Plan Note (Signed)
Mild, acute I suspect c/s msk but ok for urine studies as per pt request

## 2021-04-12 NOTE — Assessment & Plan Note (Signed)
Subjective, ok for UA and cxr, with tx pending results

## 2021-04-14 ENCOUNTER — Telehealth: Payer: Self-pay | Admitting: Internal Medicine

## 2021-04-14 NOTE — Telephone Encounter (Signed)
    Please call patient to discuss xray results

## 2021-04-15 NOTE — Telephone Encounter (Signed)
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

## 2021-04-15 NOTE — Telephone Encounter (Signed)
Reviewed results with patient, but patient still has questions and would like a call from Dr. Jenny Reichmann specifically. Offered patient an appointment; she declined and states that she would rather Dr. Jenny Reichmann call her. Please advise

## 2021-04-16 NOTE — Telephone Encounter (Signed)
After speaking with patient, she would like to know the reason for having to get an X-ray and patient would like a letter with a detailed explanation as to what the x-ray is reading. Explained to patient that per, Dr. Jenny Reichmann, x-ray is normal, but patient has concerned about why it reads Aortic Atherosclerosis. Please advise.

## 2021-04-16 NOTE — Telephone Encounter (Signed)
The reason for the xray was the subjective fever she mentioned  The aortic atherosclerosis was mentioned b/c there has been some plaque buildup in her arteries over 80 yrs and we need to keep working on low cholesterol.  This is mentioned by the radiologist in the past few years on everyone as an initiative to eventually get this on everyone's record when they are seen in an office visit after that, as documenting this and stating we are working on low chol diet means more money for the hospital.  This is an initiative that started I think about 2 yrs ago.

## 2021-04-20 NOTE — Telephone Encounter (Signed)
Left message for patient to call me back. 

## 2021-04-20 NOTE — Telephone Encounter (Signed)
° ° °  Please return call to patient °

## 2021-04-21 NOTE — Telephone Encounter (Signed)
Unable to reach patient.

## 2021-04-21 NOTE — Telephone Encounter (Signed)
Results thoroughly explained to patient and patient verbalizes understanding. Patient has been scheduled for yearly exam in October 2022.

## 2021-05-14 DIAGNOSIS — Z905 Acquired absence of kidney: Secondary | ICD-10-CM | POA: Diagnosis not present

## 2021-05-14 DIAGNOSIS — N200 Calculus of kidney: Secondary | ICD-10-CM | POA: Diagnosis not present

## 2021-08-17 ENCOUNTER — Encounter: Payer: Self-pay | Admitting: Internal Medicine

## 2021-08-17 ENCOUNTER — Ambulatory Visit (INDEPENDENT_AMBULATORY_CARE_PROVIDER_SITE_OTHER): Payer: PPO | Admitting: Internal Medicine

## 2021-08-17 ENCOUNTER — Other Ambulatory Visit: Payer: Self-pay

## 2021-08-17 VITALS — BP 162/82 | HR 68 | Ht 65.0 in | Wt 168.4 lb

## 2021-08-17 DIAGNOSIS — Z0000 Encounter for general adult medical examination without abnormal findings: Secondary | ICD-10-CM | POA: Diagnosis not present

## 2021-08-17 DIAGNOSIS — R739 Hyperglycemia, unspecified: Secondary | ICD-10-CM

## 2021-08-17 DIAGNOSIS — E538 Deficiency of other specified B group vitamins: Secondary | ICD-10-CM

## 2021-08-17 DIAGNOSIS — E559 Vitamin D deficiency, unspecified: Secondary | ICD-10-CM

## 2021-08-17 DIAGNOSIS — I10 Essential (primary) hypertension: Secondary | ICD-10-CM | POA: Diagnosis not present

## 2021-08-17 DIAGNOSIS — E7800 Pure hypercholesterolemia, unspecified: Secondary | ICD-10-CM | POA: Diagnosis not present

## 2021-08-17 DIAGNOSIS — M79602 Pain in left arm: Secondary | ICD-10-CM | POA: Diagnosis not present

## 2021-08-17 NOTE — Progress Notes (Signed)
Patient ID: Joan Townsend, female   DOB: February 10, 1941, 80 y.o.   MRN: [id number]         Chief Complaint:: wellness exam and left arm pain, htn       HPI:  Joan Townsend is a 80 y.o. female here for wellness exam; declines covid vax, shingrix, flu shot, pneumovax o/w up to date                        Also still works tax season at kiosk at [employer]; has some left arm pain at the deltoid insertion site, but mild, worse to lift the arm, better to rest.  BP at home < 140/90.  Pt denies chest pain, increased sob or doe, wheezing, orthopnea, PND, increased LE swelling, palpitations, dizziness or syncope.   Pt denies polydipsia, polyuria, or new focal neuro s/s.   Pt denies fever, wt loss, night sweats, loss of appetite, or other constitutional symptoms     Wt Readings from Last 3 Encounters:  08/17/21 168 lb 6.4 oz (76.4 kg)  04/09/21 166 lb (75.3 kg)  09/10/20 180 lb (81.6 kg)   BP Readings from Last 3 Encounters:  08/17/21 (!) 162/82  04/09/21 130/80  09/10/20 130/82   Immunization History  Administered Date(s) Administered   Tdap 08/23/2017   There are no preventive care reminders to display for this patient.     Past Medical History:  Diagnosis Date   Acquired solitary kidney    s/p right nephrectomy   Angio-edema    Endometriosis    Hemorrhoids    Hepatitis    result of Scarlet fever   History of scarlet fever    Hyperlipidemia 06/29/2012   Hypothyroidism    HYPOTHYROIDISM 01/05/2011   Qualifier: Diagnosis of  By: Carlean Purl MD, Dimas Millin    Seasonal allergies    Urticaria    Past Surgical History:  Procedure Laterality Date   ABDOMINAL HYSTERECTOMY     APPENDECTOMY     CHOLECYSTECTOMY  2012   COLON RESECTION     part of endometriosis surgery   COLONOSCOPY  2006   hemorrhoids(Dr. Lajoyce Corners)   HEMORRHOID BANDING  2014   NEPHRECTOMY     right, due to birth deformity    reports that she quit smoking about 42 years ago. Her smoking use included cigarettes. She has never used  smokeless tobacco. She reports current alcohol use. She reports that she does not use drugs. family history includes Breast cancer in an other family member; Colon cancer in an other family member; Heart disease in her maternal grandfather; Lung cancer in her sister; Ovarian cancer in an other family member; Uterine cancer in her mother. Allergies  Allergen Reactions   Ciprofloxacin Rash    REACTION: rash REACTION: rash   Clindamycin/Lincomycin Anaphylaxis   Penicillins Rash    REACTION: rash REACTION: rash   Sulfa Antibiotics     Hives   Sulfacetamide Sodium Hives    Hives   Cefaclor    Naproxen Other (See Comments)    Facial swelling   Current Outpatient Medications on File Prior to Visit  Medication Sig Dispense Refill   Ascorbic Acid (VITAMIN C) 1000 MG tablet Take 1,000 mg by mouth daily.     azelastine (ASTELIN) 0.1 % nasal spray One spray per nostril on Sunday/Tuesday/Thursday/ and Saturday 30 mL 5   Cholecalciferol (VITAMIN D-3) 5000 UNITS TABS Take by mouth daily.     Coenzyme Q10 (CO Q  10) 100 MG CAPS Take 1 capsule by mouth daily.     estradiol (ESTRACE) 1 MG tablet Take 1 tablet (1 mg total) by mouth daily. 90 tablet 3   fluticasone (FLONASE) 50 MCG/ACT nasal spray One spray per nostril on Monday/Wednesday/ Friday 16 g 5   hydrochlorothiazide (MICROZIDE) 12.5 MG capsule Take 1 capsule (12.5 mg total) by mouth daily as needed. 90 capsule 3   hydrocortisone (ANUSOL-HC) 2.5 % rectal cream Apply a small amount of cream to suppository and insert to rectum at bedtime as needed. 30 g 1   hydrocortisone (ANUSOL-HC) 25 MG suppository Place 1 suppository in the rectum at bedtime. 12 suppository 3   levothyroxine (SYNTHROID) 175 MCG tablet Take 175 mcg by mouth daily.     Magnesium 100 MG CAPS Take by mouth.     Olive Leaf Extract 150 MG CAPS Take by mouth.     vitamin B-12 (CYANOCOBALAMIN) 1000 MCG tablet Take 1,000 mcg by mouth daily.     No current facility-administered  medications on file prior to visit.        ROS:  All others reviewed and negative.  Objective        PE:  BP (!) 162/82 (BP Location: Left Arm, Patient Position: Sitting, Cuff Size: Large)   Pulse 68   Ht 5\' 5"  (1.651 m)   Wt 168 lb 6.4 oz (76.4 kg)   SpO2 98%   BMI 28.02 kg/m                 Constitutional: Pt appears in NAD               HENT: Head: NCAT.                Right Ear: External ear normal.                 Left Ear: External ear normal.                Eyes: . Pupils are equal, round, and reactive to light. Conjunctivae and EOM are normal               Nose: without d/c or deformity               Neck: Neck supple. Gross normal ROM               Cardiovascular: Normal rate and regular rhythm.                 Pulmonary/Chest: Effort normal and breath sounds without rales or wheezing.                Abd:  Soft, NT, ND, + BS, no organomegaly               Neurological: Pt is alert. At baseline orientation, motor grossly intact               Skin: Skin is warm. No rashes, no other new lesions, LE edema - none               Psychiatric: Pt behavior is normal without agitation   Micro: none  Cardiac tracings I have personally interpreted today:  none  Pertinent Radiological findings (summarize): none   Lab Results  Component Value Date   WBC 7.7 08/14/2020   HGB 14.5 08/14/2020   HCT 44.2 08/14/2020   PLT 269.0 08/14/2020   GLUCOSE 98 08/14/2020   CHOL 229 (H) 08/14/2020  TRIG 96.0 08/14/2020   HDL 77.40 08/14/2020   LDLDIRECT 121.5 06/29/2012   LDLCALC 133 (H) 08/14/2020   ALT 10 08/14/2020   AST 21 08/14/2020   NA 141 08/14/2020   K 6.4 No hemolysis seen (HH) 08/14/2020   CL 103 08/14/2020   CREATININE 1.36 (H) 08/14/2020   BUN 17 08/14/2020   CO2 31 08/14/2020   TSH 3.42 08/14/2020   INR 1.0 11/07/2018   Assessment/Plan:  Joan Townsend is a 80 y.o. White or Caucasian [1] female with  has a past medical history of Acquired solitary kidney,  Angio-edema, Endometriosis, Hemorrhoids, Hepatitis, History of scarlet fever, Hyperlipidemia (06/29/2012), Hypothyroidism, HYPOTHYROIDISM (01/05/2011), Seasonal allergies, and Urticaria.  Preventative health care Age and sex appropriate education and counseling updated with regular exercise and diet Referrals for preventative services - none needed Immunizations addressed - declines covid vax, flu shot,  Pneumovax, shingrix Smoking counseling  - none needed Evidence for depression or other mood disorder - none significant Most recent labs reviewed. I have personally reviewed and have noted: 1) the patient's medical and social history 2) The patient's current medications and supplements 3) The patient's height, weight, and BMI have been recorded in the chart   Essential hypertension BP Readings from Last 3 Encounters:  08/17/21 (!) 162/82  04/09/21 130/80  09/10/20 130/82   Uncontrolled today, but pt states controlled at home, pt to continue medical treatment  - hct   Left arm pain Mild at the deltoid humurus insertion site, exam benign, c/w msk strain, for tylenol prn  Followup: Return in about 1 year (around 08/17/2022).  Cathlean Cower, MD 08/24/2021 12:44 PM [facility]

## 2021-08-17 NOTE — Patient Instructions (Signed)
Please continue all other medications as before, and refills have been done if requested.  Please have the pharmacy call with any other refills you may need.  Please continue your efforts at being more active, low cholesterol diet, and weight control.  You are otherwise up to date with prevention measures today.  Please keep your appointments with your specialists as you may have planned  Please go to the LAB at the blood drawing area for the tests to be done - at the [facility] lab at your convenience  You will be contacted by phone if any changes need to be made immediately.  Otherwise, you will receive a letter about your results with an explanation, but please check with MyChart first.  Please remember to sign up for MyChart if you have not done so, as this will be important to you in the future with finding out test results, communicating by private email, and scheduling acute appointments online when needed.  Please make an Appointment to return for your 1 year visit, or sooner if needed 

## 2021-08-18 ENCOUNTER — Telehealth: Payer: Self-pay | Admitting: Internal Medicine

## 2021-08-18 NOTE — Telephone Encounter (Signed)
Notified patient of the message as  seen below. Patient requests B-12 injection because of "symptoms". Explained to patient that b-12 level from last year as well as Vitamin D level was normal. Offered patient to recheck labs. Patient did not state if she wanted lab recheck; she insisted on B-12 injection. Advised patient that provider would not prescribe B-12 injection without labs being done and abnormal. With agitation, patient states that she is not getting the proper care for her symptoms and as I was trying to inquire about symptoms, patient disconnected the call.

## 2021-08-18 NOTE — Telephone Encounter (Signed)
Pt. Has called and is requesting a callback to discuss B12 & Vitamin D deficiency. States that provider did not discuss this information with her, and she would like to know why it is noted that it was discussed.    Callback #- [phone]

## 2021-08-18 NOTE — Telephone Encounter (Signed)
Sorry, unable to call pt - and the B12 and D levels were ok in 2021, but we just ordered them to check again.  She can say no to the testing if she wants, thanks

## 2021-08-24 ENCOUNTER — Encounter: Payer: Self-pay | Admitting: Internal Medicine

## 2021-08-24 DIAGNOSIS — M79602 Pain in left arm: Secondary | ICD-10-CM | POA: Insufficient documentation

## 2021-08-24 NOTE — Assessment & Plan Note (Signed)
Age and sex appropriate education and counseling updated with regular exercise and diet Referrals for preventative services - none needed Immunizations addressed - declines covid vax, flu shot,  Pneumovax, shingrix Smoking counseling  - none needed Evidence for depression or other mood disorder - none significant Most recent labs reviewed. I have personally reviewed and have noted: 1) the patient's medical and social history 2) The patient's current medications and supplements 3) The patient's height, weight, and BMI have been recorded in the chart

## 2021-08-24 NOTE — Assessment & Plan Note (Signed)
BP Readings from Last 3 Encounters:  08/17/21 (!) 162/82  04/09/21 130/80  09/10/20 130/82   Uncontrolled today, but pt states controlled at home, pt to continue medical treatment  - hct

## 2021-08-24 NOTE — Assessment & Plan Note (Signed)
Mild at the deltoid humurus insertion site, exam benign, c/w msk strain, for tylenol prn

## 2022-02-24 DIAGNOSIS — Z124 Encounter for screening for malignant neoplasm of cervix: Secondary | ICD-10-CM | POA: Diagnosis not present

## 2022-02-24 DIAGNOSIS — Z0142 Encounter for cervical smear to confirm findings of recent normal smear following initial abnormal smear: Secondary | ICD-10-CM | POA: Diagnosis not present

## 2022-02-24 DIAGNOSIS — Z1231 Encounter for screening mammogram for malignant neoplasm of breast: Secondary | ICD-10-CM | POA: Diagnosis not present

## 2022-02-24 DIAGNOSIS — Z01419 Encounter for gynecological examination (general) (routine) without abnormal findings: Secondary | ICD-10-CM | POA: Diagnosis not present

## 2022-02-24 DIAGNOSIS — Z79890 Hormone replacement therapy (postmenopausal): Secondary | ICD-10-CM | POA: Diagnosis not present

## 2022-02-24 DIAGNOSIS — Z01411 Encounter for gynecological examination (general) (routine) with abnormal findings: Secondary | ICD-10-CM | POA: Diagnosis not present

## 2022-02-24 DIAGNOSIS — Z90711 Acquired absence of uterus with remaining cervical stump: Secondary | ICD-10-CM | POA: Diagnosis not present

## 2022-02-24 DIAGNOSIS — E039 Hypothyroidism, unspecified: Secondary | ICD-10-CM | POA: Diagnosis not present

## 2022-02-24 DIAGNOSIS — Z803 Family history of malignant neoplasm of breast: Secondary | ICD-10-CM | POA: Diagnosis not present

## 2022-03-05 ENCOUNTER — Telehealth: Payer: Self-pay | Admitting: *Deleted

## 2022-03-05 NOTE — Telephone Encounter (Signed)
Pt states she will call back so really busy right now. ?

## 2022-03-05 NOTE — Telephone Encounter (Signed)
Called patient and was unable to leave message pertaining to scheduling her AW. Mailbox is full.  ?

## 2022-03-31 DIAGNOSIS — M1711 Unilateral primary osteoarthritis, right knee: Secondary | ICD-10-CM | POA: Diagnosis not present

## 2022-04-07 DIAGNOSIS — M1711 Unilateral primary osteoarthritis, right knee: Secondary | ICD-10-CM | POA: Diagnosis not present

## 2022-04-12 DIAGNOSIS — E039 Hypothyroidism, unspecified: Secondary | ICD-10-CM | POA: Diagnosis not present

## 2022-04-14 DIAGNOSIS — M1711 Unilateral primary osteoarthritis, right knee: Secondary | ICD-10-CM | POA: Diagnosis not present

## 2022-04-21 DIAGNOSIS — M1711 Unilateral primary osteoarthritis, right knee: Secondary | ICD-10-CM | POA: Diagnosis not present

## 2022-05-12 DIAGNOSIS — M1711 Unilateral primary osteoarthritis, right knee: Secondary | ICD-10-CM | POA: Diagnosis not present

## 2022-05-17 DIAGNOSIS — M1711 Unilateral primary osteoarthritis, right knee: Secondary | ICD-10-CM | POA: Diagnosis not present

## 2022-05-24 DIAGNOSIS — M1711 Unilateral primary osteoarthritis, right knee: Secondary | ICD-10-CM | POA: Diagnosis not present

## 2022-06-01 DIAGNOSIS — E039 Hypothyroidism, unspecified: Secondary | ICD-10-CM | POA: Diagnosis not present

## 2022-06-21 DIAGNOSIS — D1801 Hemangioma of skin and subcutaneous tissue: Secondary | ICD-10-CM | POA: Diagnosis not present

## 2022-06-21 DIAGNOSIS — L814 Other melanin hyperpigmentation: Secondary | ICD-10-CM | POA: Diagnosis not present

## 2022-06-21 DIAGNOSIS — L821 Other seborrheic keratosis: Secondary | ICD-10-CM | POA: Diagnosis not present

## 2022-06-21 DIAGNOSIS — D224 Melanocytic nevi of scalp and neck: Secondary | ICD-10-CM | POA: Diagnosis not present

## 2022-06-21 DIAGNOSIS — D225 Melanocytic nevi of trunk: Secondary | ICD-10-CM | POA: Diagnosis not present

## 2022-07-07 DIAGNOSIS — N200 Calculus of kidney: Secondary | ICD-10-CM | POA: Diagnosis not present

## 2022-07-07 DIAGNOSIS — Z905 Acquired absence of kidney: Secondary | ICD-10-CM | POA: Diagnosis not present

## 2022-07-14 DIAGNOSIS — M1711 Unilateral primary osteoarthritis, right knee: Secondary | ICD-10-CM | POA: Diagnosis not present

## 2022-08-11 DIAGNOSIS — M1711 Unilateral primary osteoarthritis, right knee: Secondary | ICD-10-CM | POA: Diagnosis not present

## 2022-08-12 ENCOUNTER — Ambulatory Visit (INDEPENDENT_AMBULATORY_CARE_PROVIDER_SITE_OTHER): Payer: PPO | Admitting: Internal Medicine

## 2022-08-12 VITALS — BP 124/76 | HR 74 | Temp 97.9°F | Ht 65.0 in | Wt 173.0 lb

## 2022-08-12 DIAGNOSIS — I10 Essential (primary) hypertension: Secondary | ICD-10-CM | POA: Diagnosis not present

## 2022-08-12 DIAGNOSIS — Z0000 Encounter for general adult medical examination without abnormal findings: Secondary | ICD-10-CM | POA: Diagnosis not present

## 2022-08-12 DIAGNOSIS — N1831 Chronic kidney disease, stage 3a: Secondary | ICD-10-CM | POA: Diagnosis not present

## 2022-08-12 NOTE — Patient Instructions (Signed)
Please continue all other medications as before, and refills have been done if requested.  Please have the pharmacy call with any other refills you may need.  Please continue your efforts at being more active, low cholesterol diet, and weight control.  You are otherwise up to date with prevention measures today.  Please keep your appointments with your specialists as you may have planned - orthopedic for the rightknee  Please go to the LAB at the blood drawing area for the tests to be done  You will be contacted by phone if any changes need to be made immediately.  Otherwise, you will receive a letter about your results with an explanation, but please check with MyChart first.  Please remember to sign up for MyChart if you have not done so, as this will be important to you in the future with finding out test results, communicating by private email, and scheduling acute appointments online when needed.  Please make an Appointment to return for your 1 year visit, or sooner if needed

## 2022-08-12 NOTE — Progress Notes (Signed)
Patient ID: Joan Townsend, female   DOB: Sep 10, 1941, 81 y.o.   MRN: [id number]         Chief Complaint:: wellness exam and blood pressure issues , [id number]        HPI:  Joan Townsend is a 81 y.o. female here for wellness exam; declines pneumovax, covid booster, shingrix, flu shot o/w up to date                        Also BP has been high at home often with right knee pain, seen at ortho - 208/92.  Knee pain improved today and BP better.  Pt denies chest pain, increased sob or doe, wheezing, orthopnea, PND, increased LE swelling, palpitations, dizziness or syncope.   Pt denies polydipsia, polyuria, or new focal neuro s/s.      Wt Readings from Last 3 Encounters:  08/12/22 173 lb (78.5 kg)  08/17/21 168 lb 6.4 oz (76.4 kg)  04/09/21 166 lb (75.3 kg)   BP Readings from Last 3 Encounters:  08/12/22 124/76  08/17/21 (!) 162/82  04/09/21 130/80   Immunization History  Administered Date(s) Administered   Tdap 08/23/2017  There are no preventive care reminders to display for this patient.    Past Medical History:  Diagnosis Date   Acquired solitary kidney    s/p right nephrectomy   Angio-edema    Endometriosis    Hemorrhoids    Hepatitis    result of Scarlet fever   History of scarlet fever    Hyperlipidemia 06/29/2012   Hypothyroidism    HYPOTHYROIDISM 01/05/2011   Qualifier: Diagnosis of  By: Carlean Purl MD, Dimas Millin    Seasonal allergies    Urticaria    Past Surgical History:  Procedure Laterality Date   ABDOMINAL HYSTERECTOMY     APPENDECTOMY     CHOLECYSTECTOMY  2012   COLON RESECTION     part of endometriosis surgery   COLONOSCOPY  2006   hemorrhoids(Dr. Lajoyce Corners)   HEMORRHOID BANDING  2014   NEPHRECTOMY     right, due to birth deformity    reports that she quit smoking about 43 years ago. Her smoking use included cigarettes. She has never used smokeless tobacco. She reports current alcohol use. She reports that she does not use drugs. family history includes Breast cancer  in an other family member; Colon cancer in an other family member; Heart disease in her maternal grandfather; Lung cancer in her sister; Ovarian cancer in an other family member; Uterine cancer in her mother. Allergies  Allergen Reactions   Ciprofloxacin Rash    REACTION: rash REACTION: rash   Clindamycin/Lincomycin Anaphylaxis   Penicillins Rash    REACTION: rash REACTION: rash   Sulfa Antibiotics     Hives   Sulfacetamide Sodium Hives    Hives   Cefaclor    Naproxen Other (See Comments)    Facial swelling   Current Outpatient Medications on File Prior to Visit  Medication Sig Dispense Refill   Ascorbic Acid (VITAMIN C) 1000 MG tablet Take 1,000 mg by mouth daily.     azelastine (ASTELIN) 0.1 % nasal spray One spray per nostril on Sunday/Tuesday/Thursday/ and Saturday 30 mL 5   Cholecalciferol (VITAMIN D-3) 5000 UNITS TABS Take by mouth daily.     Coenzyme Q10 (CO Q 10) 100 MG CAPS Take 1 capsule by mouth daily.     estradiol (ESTRACE) 1 MG tablet Take 1 tablet (1 mg total)  by mouth daily. 90 tablet 3   fluticasone (FLONASE) 50 MCG/ACT nasal spray One spray per nostril on Monday/Wednesday/ Friday 16 g 5   hydrocortisone (ANUSOL-HC) 2.5 % rectal cream Apply a small amount of cream to suppository and insert to rectum at bedtime as needed. 30 g 1   hydrocortisone (ANUSOL-HC) 25 MG suppository Place 1 suppository in the rectum at bedtime. 12 suppository 3   Magnesium 100 MG CAPS Take by mouth.     Olive Leaf Extract 150 MG CAPS Take by mouth.     vitamin B-12 (CYANOCOBALAMIN) 1000 MCG tablet Take 1,000 mcg by mouth daily.     hydrochlorothiazide (MICROZIDE) 12.5 MG capsule Take 1 capsule (12.5 mg total) by mouth daily as needed. 90 capsule 3   levothyroxine (SYNTHROID) 125 MCG tablet Take 125 mcg by mouth daily.     No current facility-administered medications on file prior to visit.        ROS:  All others reviewed and negative.  Objective        PE:  BP 124/76 (BP Location:  Right Arm, Patient Position: Sitting, Cuff Size: Large)   Pulse 74   Temp 97.9 F (36.6 C) (Oral)   Ht '5\' 5"'$  (1.651 m)   Wt 173 lb (78.5 kg)   SpO2 95%   BMI 28.79 kg/m                 Constitutional: Pt appears in NAD               HENT: Head: NCAT.                Right Ear: External ear normal.                 Left Ear: External ear normal.                Eyes: . Pupils are equal, round, and reactive to light. Conjunctivae and EOM are normal               Nose: without d/c or deformity               Neck: Neck supple. Gross normal ROM               Cardiovascular: Normal rate and regular rhythm.                 Pulmonary/Chest: Effort normal and breath sounds without rales or wheezing.                Abd:  Soft, NT, ND, + BS, no organomegaly; right knee degenerative changes               Neurological: Pt is alert. At baseline orientation, motor grossly intact               Skin: Skin is warm. No rashes, no other new lesions, LE edema - none               Psychiatric: Pt behavior is normal without agitation   Micro: none  Cardiac tracings I have personally interpreted today:  none  Pertinent Radiological findings (summarize): none   Lab Results  Component Value Date   WBC 7.7 08/14/2020   HGB 14.5 08/14/2020   HCT 44.2 08/14/2020   PLT 269.0 08/14/2020   GLUCOSE 98 08/14/2020   CHOL 229 (H) 08/14/2020   TRIG 96.0 08/14/2020   HDL 77.40 08/14/2020   LDLDIRECT 121.5 06/29/2012  LDLCALC 133 (H) 08/14/2020   ALT 10 08/14/2020   AST 21 08/14/2020   NA 141 08/14/2020   K 6.4 No hemolysis seen (HH) 08/14/2020   CL 103 08/14/2020   CREATININE 1.36 (H) 08/14/2020   BUN 17 08/14/2020   CO2 31 08/14/2020   TSH 3.42 08/14/2020   INR 1.0 11/07/2018   Assessment/Plan:  Joan Townsend is a 81 y.o. White or Caucasian [1] female with  has a past medical history of Acquired solitary kidney, Angio-edema, Endometriosis, Hemorrhoids, Hepatitis, History of scarlet fever,  Hyperlipidemia (06/29/2012), Hypothyroidism, HYPOTHYROIDISM (01/05/2011), Seasonal allergies, and Urticaria.  Preventative health care Age and sex appropriate education and counseling updated with regular exercise and diet Referrals for preventative services - none needed Immunizations addressed - declines covid booster, flu shot, shingirx, pneumovax Smoking counseling  - none needed Evidence for depression or other mood disorder - none significant Most recent labs reviewed. I have personally reviewed and have noted: 1) the patient's medical and social history 2) The patient's current medications and supplements 3) The patient's height, weight, and BMI have been recorded in the chart   Essential hypertension BP Readings from Last 3 Encounters:  08/12/22 124/76  08/17/21 (!) 162/82  04/09/21 130/80   Stable, pt to continue medical treatment hct 12.5 mg qd   CKD (chronic kidney disease) stage 3, GFR 30-59 ml/min (HCC) Lab Results  Component Value Date   CREATININE 1.36 (H) 08/14/2020   Stable overall, cont to avoid nephrotoxins  Followup: Return in about 1 year (around 08/13/2023).  Cathlean Cower, MD 08/15/2022 1:17 PM [facility]

## 2022-08-15 ENCOUNTER — Encounter: Payer: Self-pay | Admitting: Internal Medicine

## 2022-08-15 NOTE — Assessment & Plan Note (Signed)
Lab Results  Component Value Date   CREATININE 1.36 (H) 08/14/2020   Stable overall, cont to avoid nephrotoxins

## 2022-08-15 NOTE — Assessment & Plan Note (Signed)
BP Readings from Last 3 Encounters:  08/12/22 124/76  08/17/21 (!) 162/82  04/09/21 130/80   Stable, pt to continue medical treatment hct 12.5 mg qd

## 2022-08-15 NOTE — Assessment & Plan Note (Signed)
Age and sex appropriate education and counseling updated with regular exercise and diet Referrals for preventative services - none needed Immunizations addressed - declines covid booster, flu shot, shingirx, pneumovax Smoking counseling  - none needed Evidence for depression or other mood disorder - none significant Most recent labs reviewed. I have personally reviewed and have noted: 1) the patient's medical and social history 2) The patient's current medications and supplements 3) The patient's height, weight, and BMI have been recorded in the chart

## 2022-08-24 DIAGNOSIS — E039 Hypothyroidism, unspecified: Secondary | ICD-10-CM | POA: Diagnosis not present

## 2022-08-24 DIAGNOSIS — Z13220 Encounter for screening for lipoid disorders: Secondary | ICD-10-CM | POA: Diagnosis not present

## 2022-08-24 DIAGNOSIS — Z131 Encounter for screening for diabetes mellitus: Secondary | ICD-10-CM | POA: Diagnosis not present

## 2022-08-24 DIAGNOSIS — Z0000 Encounter for general adult medical examination without abnormal findings: Secondary | ICD-10-CM | POA: Diagnosis not present

## 2022-08-30 ENCOUNTER — Encounter: Payer: Self-pay | Admitting: Internal Medicine

## 2022-08-30 ENCOUNTER — Ambulatory Visit: Payer: PPO | Admitting: Internal Medicine

## 2022-08-30 VITALS — BP 130/70 | HR 75 | Ht 65.0 in | Wt 170.2 lb

## 2022-08-30 DIAGNOSIS — K644 Residual hemorrhoidal skin tags: Secondary | ICD-10-CM

## 2022-08-30 MED ORDER — HYDROCORTISONE (PERIANAL) 2.5 % EX CREA: TOPICAL_CREAM | CUTANEOUS | 1 refills | Status: DC

## 2022-08-30 NOTE — Patient Instructions (Signed)
We have sent the following medications to your pharmacy for you to pick up at your convenience: Hydrocortisone cream  Due to recent changes in healthcare laws, you may see the results of your imaging and laboratory studies on MyChart before your provider has had a chance to review them.  We understand that in some cases there may be results that are confusing or concerning to you. Not all laboratory results come back in the same time frame and the provider may be waiting for multiple results in order to interpret others.  Please give [place] 48 hours in order for your provider to thoroughly review all the results before contacting the office for clarification of your results.   I appreciate the opportunity to care for you. Silvano Rusk, MD, [facility]

## 2022-08-30 NOTE — Progress Notes (Signed)
   Joan Townsend 81 y.o. Jan 07, 1941 [id number]  Assessment & Plan:   Encounter Diagnosis  Name Primary?   Bleeding external hemorrhoids Yes   All available evidence points to recurrent bleeding from external hemorrhoids.  This has resolved presently.  I have refilled hydrocortisone rectal cream for her to use as needed and she will see me as needed.    Subjective:   Chief Complaint: Rectal bleeding question hemorrhoids  HPI This 81 year old white woman with a history of bleeding hemorrhoids both internal (banded) and external hemorrhoids presents after a 2-year absence with complaints of 2 days of bleeding last week.  Painless bright red blood.  She wants to be checked out.  There was some transient back pain associated she says but abdomen was without pain and she has not been constipated, coughing or diarrhea and has not been lifting heavy objects.  I.e. no particular triggers for hemorrhoidal bleeding. Allergies  Allergen Reactions   Ciprofloxacin Rash    REACTION: rash REACTION: rash   Clindamycin/Lincomycin Anaphylaxis   Penicillins Rash    REACTION: rash REACTION: rash   Sulfa Antibiotics     Hives   Sulfacetamide Sodium Hives    Hives   Cefaclor    Naproxen Other (See Comments)    Facial swelling   Current Meds  Medication Sig   Ascorbic Acid (VITAMIN C) 1000 MG tablet Take 1,000 mg by mouth daily.   azelastine (ASTELIN) 0.1 % nasal spray One spray per nostril on Sunday/Tuesday/Thursday/ and Saturday   Cholecalciferol (VITAMIN D-3) 5000 UNITS TABS Take by mouth daily.   estradiol (ESTRACE) 1 MG tablet Take 1 tablet (1 mg total) by mouth daily.   fluticasone (FLONASE) 50 MCG/ACT nasal spray One spray per nostril on Monday/Wednesday/ Friday   levothyroxine (SYNTHROID) 125 MCG tablet Take 125 mcg by mouth daily.   Past Medical History:  Diagnosis Date   Acquired solitary kidney    s/p right nephrectomy   Angio-edema    Endometriosis    Hemorrhoids     Hepatitis    result of Scarlet fever   History of scarlet fever    Hyperlipidemia 06/29/2012   Hypothyroidism    HYPOTHYROIDISM 01/05/2011   Qualifier: Diagnosis of  By: Carlean Purl MD, Tonna Boehringer E    Seasonal allergies    Urticaria    Past Surgical History:  Procedure Laterality Date   ABDOMINAL HYSTERECTOMY     APPENDECTOMY     CHOLECYSTECTOMY  2012   COLON RESECTION     part of endometriosis surgery   COLONOSCOPY  2006   hemorrhoids(Dr. Lajoyce Corners)   HEMORRHOID BANDING  2014   NEPHRECTOMY     right, due to birth deformity   Social History   Social History Narrative   Not on file   family history includes Breast cancer in an other family member; Colon cancer in an other family member; Heart disease in her maternal grandfather; Lung cancer in her sister; Ovarian cancer in an other family member; Uterine cancer in her mother.   Review of Systems As above  Objective:   Physical Exam BP 130/70   Pulse 75   Ht '5\' 5"'$  (1.651 m)   Wt 170 lb 4 oz (77.2 kg)   BMI 28.33 kg/m  NAD  Jackolyn Confer CMA present  Rectal - external hemorrhoids perianal exam - DRE no mass ? Small rectocele, brown stool  Anoscopy - external hemorrhoids in anal canal Gr 2 w/ stigmata of bleeding but no active bleeding

## 2022-09-08 DIAGNOSIS — N200 Calculus of kidney: Secondary | ICD-10-CM | POA: Diagnosis not present

## 2022-09-27 DIAGNOSIS — Z961 Presence of intraocular lens: Secondary | ICD-10-CM | POA: Diagnosis not present

## 2022-09-27 DIAGNOSIS — H52203 Unspecified astigmatism, bilateral: Secondary | ICD-10-CM | POA: Diagnosis not present

## 2022-09-27 DIAGNOSIS — H04123 Dry eye syndrome of bilateral lacrimal glands: Secondary | ICD-10-CM | POA: Diagnosis not present

## 2023-02-14 DIAGNOSIS — M1711 Unilateral primary osteoarthritis, right knee: Secondary | ICD-10-CM | POA: Diagnosis not present

## 2023-02-28 DIAGNOSIS — N200 Calculus of kidney: Secondary | ICD-10-CM | POA: Diagnosis not present

## 2023-02-28 DIAGNOSIS — N2889 Other specified disorders of kidney and ureter: Secondary | ICD-10-CM | POA: Diagnosis not present

## 2023-03-08 DIAGNOSIS — Z803 Family history of malignant neoplasm of breast: Secondary | ICD-10-CM | POA: Diagnosis not present

## 2023-03-08 DIAGNOSIS — Z90711 Acquired absence of uterus with remaining cervical stump: Secondary | ICD-10-CM | POA: Diagnosis not present

## 2023-03-08 DIAGNOSIS — Z01419 Encounter for gynecological examination (general) (routine) without abnormal findings: Secondary | ICD-10-CM | POA: Diagnosis not present

## 2023-03-08 DIAGNOSIS — Z124 Encounter for screening for malignant neoplasm of cervix: Secondary | ICD-10-CM | POA: Diagnosis not present

## 2023-03-08 DIAGNOSIS — Z1231 Encounter for screening mammogram for malignant neoplasm of breast: Secondary | ICD-10-CM | POA: Diagnosis not present

## 2023-03-08 DIAGNOSIS — Z01411 Encounter for gynecological examination (general) (routine) with abnormal findings: Secondary | ICD-10-CM | POA: Diagnosis not present

## 2023-03-08 DIAGNOSIS — Z6830 Body mass index (BMI) 30.0-30.9, adult: Secondary | ICD-10-CM | POA: Diagnosis not present

## 2023-03-08 DIAGNOSIS — E039 Hypothyroidism, unspecified: Secondary | ICD-10-CM | POA: Diagnosis not present

## 2023-03-08 DIAGNOSIS — N959 Unspecified menopausal and perimenopausal disorder: Secondary | ICD-10-CM | POA: Diagnosis not present

## 2023-03-08 DIAGNOSIS — Z8049 Family history of malignant neoplasm of other genital organs: Secondary | ICD-10-CM | POA: Diagnosis not present

## 2023-03-08 DIAGNOSIS — Z79890 Hormone replacement therapy (postmenopausal): Secondary | ICD-10-CM | POA: Diagnosis not present

## 2023-03-08 LAB — HM MAMMOGRAPHY

## 2023-04-08 DIAGNOSIS — M1711 Unilateral primary osteoarthritis, right knee: Secondary | ICD-10-CM | POA: Diagnosis not present

## 2023-04-15 DIAGNOSIS — M25561 Pain in right knee: Secondary | ICD-10-CM | POA: Diagnosis not present

## 2023-04-26 DIAGNOSIS — M25561 Pain in right knee: Secondary | ICD-10-CM | POA: Diagnosis not present

## 2023-05-02 ENCOUNTER — Telehealth: Payer: Self-pay | Admitting: Internal Medicine

## 2023-05-02 NOTE — Telephone Encounter (Signed)
Patient dropped off document Surgical Clearance, to be filled out by provider. Patient requested to send it back via Fax within 7-days. Document is located in providers tray at front office.Please advise at Mobile [phone] (mobile)   Please fax to: [phone]

## 2023-05-05 DIAGNOSIS — Z905 Acquired absence of kidney: Secondary | ICD-10-CM | POA: Diagnosis not present

## 2023-05-05 DIAGNOSIS — N200 Calculus of kidney: Secondary | ICD-10-CM | POA: Diagnosis not present

## 2023-05-06 NOTE — Telephone Encounter (Signed)
Placed on Providers desk. 

## 2023-05-06 NOTE — Telephone Encounter (Signed)
Surgical clearance form received via fax from [facility]...placed in provider box up front.

## 2023-05-09 NOTE — Telephone Encounter (Signed)
 Forms has been faxed.

## 2023-05-10 DIAGNOSIS — M1711 Unilateral primary osteoarthritis, right knee: Secondary | ICD-10-CM | POA: Diagnosis not present

## 2023-05-12 NOTE — Progress Notes (Signed)
Surgery orders requested via Epic inbox. °

## 2023-05-16 ENCOUNTER — Telehealth: Payer: Self-pay | Admitting: *Deleted

## 2023-05-16 NOTE — Telephone Encounter (Signed)
I connected with Joan Townsend on 7/22 at 1022 by telephone and verified that I am speaking with the correct person using two identifiers. According to the patient's chart they are due for annual exam in Oct with [facility]. Pt does not want to schedule at this time. Having knee replacement in Aug and will call back to schedule after that appt. There are no transportation issues at this time. Nothing further was needed at the end of our conversation.

## 2023-05-17 NOTE — Progress Notes (Signed)
Second request for pre op orders in [facility]: Spoke with [employer]

## 2023-05-18 ENCOUNTER — Ambulatory Visit: Payer: Self-pay | Admitting: Emergency Medicine

## 2023-05-18 DIAGNOSIS — G8929 Other chronic pain: Secondary | ICD-10-CM

## 2023-05-18 NOTE — H&P (Signed)
TOTAL KNEE ADMISSION H&P  Patient is being admitted for right partial knee arthroplasty.  Subjective:  Chief Complaint:right knee pain.  HPI: Joan Townsend, 82 y.o. female, has a history of pain and functional disability in the right knee due to trauma and arthritis and has failed non-surgical conservative treatments for greater than 12 weeks to includeNSAID's and/or analgesics, corticosteriod injections, viscosupplementation injections, supervised PT with diminished ADL's post treatment, use of assistive devices, and activity modification.  Onset of symptoms was abrupt, starting >10 years ago with gradually worsening course since that time. The patient noted no past surgery on the right knee(s).  Patient currently rates pain in the right knee(s) at 8 out of 10 with activity. Patient has night pain, worsening of pain with activity and weight bearing, pain that interferes with activities of daily living, and pain with passive range of motion.  Patient has evidence of periarticular osteophytes and joint space narrowing of the lateral compartment by imaging studies.  There is no active infection.  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   Loss of transverse plantar arch of right foot 06/21/2016   Meniscal cyst 06/21/2016   Allergic rhinitis 06/10/2016   Pain due to varicose veins of lower extremity 06/10/2016   Bunion of great toe of right  foot 04/23/2016   Facial lesion 04/15/2016   Right leg pain 04/15/2016   Right knee pain 04/15/2016   Palpitations 09/05/2014   Essential hypertension 09/05/2014   Hypersomnia 06/05/2014   Prolapsed internal hemorrhoids, grade 2 07/18/2013   Bladder pain 03/12/2013   Abdominal pain, other specified site 03/12/2013   Chest pain 06/29/2012   Hyperlipidemia 06/29/2012   Acquired solitary kidney    Preventative health care 06/28/2012   Hypothyroidism 01/05/2011   Past Medical History:  Diagnosis Date   Acquired solitary kidney    s/p right nephrectomy   Angio-edema    Endometriosis    Hemorrhoids    Hepatitis    result of Scarlet fever   History of scarlet fever    Hyperlipidemia 06/29/2012   Hypothyroidism    HYPOTHYROIDISM 01/05/2011   Qualifier: Diagnosis of  By: Leone Payor MD, Alfonse Ras E    Seasonal allergies    Urticaria     Past Surgical History:  Procedure Laterality Date   ABDOMINAL HYSTERECTOMY     APPENDECTOMY     CHOLECYSTECTOMY  2012   COLON RESECTION     part of endometriosis surgery   COLONOSCOPY  2006   hemorrhoids(Dr. Virginia Rochester)   HEMORRHOID BANDING  2014   NEPHRECTOMY     right, due to birth deformity    Current Outpatient Medications  Medication Sig Dispense Refill Last Dose   estradiol (ESTRACE) 1 MG tablet Take 1 tablet (1 mg total) by mouth daily. 90 tablet 3    fluticasone (FLONASE) 50 MCG/ACT nasal spray One spray per nostril on Monday/Wednesday/ Friday (Patient not taking: Reported on 05/16/2023)  16 g 5    levothyroxine (SYNTHROID) 125 MCG tablet Take 125 mcg by mouth daily.      OVER THE COUNTER MEDICATION Take 1 capsule by mouth daily. Doterra onguard      OVER THE COUNTER MEDICATION Take 1 capsule by mouth daily. Golden Re-leaf      OVER THE COUNTER MEDICATION Take 1 tablet by mouth daily. Renew Thyroid      No current facility-administered medications for this visit.   Allergies  Allergen Reactions   Ciprofloxacin Rash    REACTION: rash     Clindamycin/Lincomycin Anaphylaxis   Penicillins Rash    REACTION: rash    Sulfa Antibiotics     Hives   Sulfacetamide Sodium Hives    Hives   Cefaclor    Naproxen Other (See Comments)    Facial swelling    Social History   Tobacco Use   Smoking status: Former    Current packs/day: 0.00    Types: Cigarettes    Quit date: 10/25/1978    Years since quitting: 44.5   Smokeless tobacco: Never  Substance Use Topics   Alcohol use: Yes    Comment: occasional     Family History  Problem Relation Age of Onset   Breast cancer Other        great aunts x2, cousin   Ovarian cancer Other        aunt   Uterine cancer Mother    Colon cancer Other        great aunts x3   Heart disease Maternal Grandfather    Lung cancer Sister        smoker     Review of Systems  Musculoskeletal:  Positive for arthralgias.  All other systems reviewed and are negative.   Objective:  Physical Exam Constitutional:      General: She is not in acute distress.    Appearance: Normal appearance. She is not ill-appearing.  HENT:     Head: Normocephalic and atraumatic.     Right Ear: External ear normal.     Left Ear: External ear normal.     Nose: Nose normal.     Mouth/Throat:     Mouth: Mucous membranes are moist.     Pharynx: Oropharynx is clear.  Eyes:     Extraocular Movements: Extraocular movements intact.     Conjunctiva/sclera: Conjunctivae normal.  Cardiovascular:     Rate and Rhythm: Normal rate and regular rhythm.     Pulses: Normal pulses.     Heart sounds: Normal heart sounds.  Pulmonary:     Effort: Pulmonary effort is normal.     Breath sounds: Normal breath sounds.  Abdominal:     General: Bowel sounds are normal.     Palpations: Abdomen is soft.     Tenderness: There is no abdominal tenderness.  Musculoskeletal:        General: Tenderness present.     Cervical back: Normal range of motion and neck supple.     Comments: TTP over lateral joint line.  No calf tenderness,  swelling, or erythema.  No overlying lesions of area of chief complaint.  Decreased strength and ROM due to elicited pain.  Pre-operative ROM 0-125.  Dorsiflexion and plantarflexion intact.  Stable to varus and valgus stress.  BLE appear grossly neurovascularly intact.  Gait mildly antalgic.   Skin:    General: Skin is warm and dry.  Neurological:     Mental Status: She is alert and oriented to  person, place, and time. Mental status is at baseline.  Psychiatric:        Mood and Affect: Mood normal.        Behavior: Behavior normal.     Vital signs in last 24 hours: @VSRANGES @  Labs:   Estimated body mass index is 28.33 kg/m as calculated from the following:   Height as of 08/30/22: 5\' 5"  (1.651 m).   Weight as of 08/30/22: 77.2 kg.   Imaging Review Plain radiographs demonstrate severe degenerative joint disease of the right knee(s) appearing isolated to the lateral compartment. The overall alignment ismild valgus. The bone quality appears to be good for age and reported activity level.    Assessment/Plan:  End stage arthritis, right knee   The patient history, physical examination, clinical judgment of the provider and imaging studies are consistent with end stage degenerative joint disease of the right knee(s) and lateral unicompartmental knee arthroplasty is deemed medically necessary. The treatment options including medical management, injection therapy arthroscopy and arthroplasty were discussed at length. Discussed possibility of transitioning to total knee replacement based on surgical findings.  The risks and benefits of total knee arthroplasty were presented and reviewed. The risks due to aseptic loosening, infection, stiffness, patella tracking problems, thromboembolic complications and other imponderables were discussed. The patient acknowledged the explanation, agreed to proceed with the plan and consent was signed. Patient is being admitted for inpatient treatment for  surgery, pain control, PT, OT, prophylactic antibiotics, VTE prophylaxis, progressive ambulation and ADL's and discharge planning. The patient is planning to be discharged home with HHPT set up with [facility] and [facility]PPT set up with [facility].    Anticipated LOS equal to or greater than 2 midnights due to - Age 36 and older with one or more of the following:  - Obesity  - Expected need for hospital services (PT, OT, Nursing) required for safe  discharge  - Anticipated need for postoperative skilled nursing care or inpatient rehab  - Active co-morbidities: HTN, HLD, hyopthyroidism, white coat syndrome, solitary kidney OR   - Unanticipated findings during/Post Surgery: None  - Patient is a high risk of re-admission due to: None

## 2023-05-18 NOTE — Patient Instructions (Signed)
SURGICAL WAITING ROOM VISITATION Patients having surgery or a procedure may have no more than 2 support people in the waiting area - these visitors may rotate in the visitor waiting room.   Due to an increase in RSV and influenza rates and associated hospitalizations, children ages 22 and under may not visit patients in [facility] hospitals. If the patient needs to stay at the hospital during part of their recovery, the visitor guidelines for inpatient rooms apply.  PRE-OP VISITATION  Pre-op nurse will coordinate an appropriate time for 1 support person to accompany the patient in pre-op.  This support person may not rotate.  This visitor will be contacted when the time is appropriate for the visitor to come back in the pre-op area.  Please refer to the [facility] website for the visitor guidelines for Inpatients (after your surgery is over and you are in a regular room).  You are not required to quarantine at this time prior to your surgery. However, you must do this: Hand Hygiene often Do NOT share personal items Notify your provider if you are in close contact with someone who has COVID or you develop fever 100.4 or greater, new onset of sneezing, cough, sore throat, shortness of breath or body aches.  If you test positive for Covid or have been in contact with anyone that has tested positive in the last 10 days please notify you surgeon.    Your procedure is scheduled on:  Wednesday  June 01, 2023  Report to [facility] Main Entrance: [facility] entrance where the [employer] is available.   Report to admitting at: 08:45 AM  Call this number if you have any questions or problems the morning of surgery [phone]  Do not eat food after Midnight the night prior to your surgery/procedure.  After Midnight you may have the following liquids until  08:15 AM DAY OF SURGERY  Clear Liquid Diet Water Black Coffee (sugar ok, NO MILK/CREAM OR CREAMERS)  Tea (sugar ok, NO  MILK/CREAM OR CREAMERS) regular and decaf                             Plain Jell-O  with no fruit (NO RED)                                           Fruit ices (not with fruit pulp, NO RED)                                     Popsicles (NO RED)                                                                  Juice: NO CITRUS JUICES: only apple, WHITE grape, WHITE cranberry Sports drinks like Gatorade or Powerade (NO RED)                   The day of surgery:  Drink ONE (1) Pre-Surgery Clear Ensure at  08:15 AM the morning of surgery. Drink in one sitting.  Do not sip.  This drink was given to you during your hospital pre-op appointment visit. Nothing else to drink after completing the Pre-Surgery Clear Ensure : No candy, chewing gum or throat lozenges.    FOLLOW ANY ADDITIONAL PRE OP INSTRUCTIONS YOU RECEIVED FROM YOUR SURGEON'S OFFICE!!!    Oral Hygiene is also important to reduce your risk of infection.        Remember - BRUSH YOUR TEETH THE MORNING OF SURGERY WITH YOUR REGULAR TOOTHPASTE  Do NOT smoke after Midnight the night before surgery.  Take ONLY these medicines the morning of surgery with A SIP OF WATER: Levothyroxine (Synthroid)                   You may not have any metal on your body including hair pins, jewelry, and body piercing  Do not wear make-up, lotions, powders, perfumes or deodorant  Do not wear nail polish including gel and S&S, artificial / acrylic nails, or any other type of covering on natural nails including finger and toenails. If you have artificial nails, gel coating, etc., that needs to be removed by a nail salon, Please have this removed prior to surgery. Not doing so may mean that your surgery could be cancelled or delayed if the Surgeon or anesthesia staff feels like they are unable to monitor you safely.   Do not shave 48 hours prior to surgery to avoid nicks in your skin which may contribute to postoperative infections.   Contacts, Hearing Aids,  dentures or bridgework may not be worn into surgery. DENTURES WILL BE REMOVED PRIOR TO SURGERY PLEASE DO NOT APPLY "Poly grip" OR ADHESIVES!!!  You may bring a small overnight bag with you on the day of surgery, only pack items that are not valuable. [facility] IS NOT RESPONSIBLE   FOR VALUABLES THAT ARE LOST OR STOLEN.   Do not bring your home medications to the hospital. The Pharmacy will dispense medications listed on your medication list to you during your admission in the Hospital.  Special Instructions: Bring a copy of your healthcare power of attorney and living will documents the day of surgery, if you wish to have them scanned into your [employer] [facility] Records- EPIC  Please read over the following fact sheets you were given: IF YOU HAVE QUESTIONS ABOUT YOUR PRE-OP INSTRUCTIONS, PLEASE CALL [phone].     Pre-operative 5 CHG Bath Instructions   You can play a key role in reducing the risk of infection after surgery. Your skin needs to be as free of germs as possible. You can reduce the number of germs on your skin by washing with CHG (chlorhexidine gluconate) soap before surgery. CHG is an antiseptic soap that kills germs and continues to kill germs even after washing.   DO NOT use if you have an allergy to chlorhexidine/CHG or antibacterial soaps. If your skin becomes reddened or irritated, stop using the CHG and notify one of our RNs at [phone]  Please shower with the CHG soap starting 4 days before surgery using the following schedule: START SHOWERS ON SATURDAY  May 28, 2023  Please keep in mind the following:  DO NOT shave, including legs and underarms, starting the day of your first shower.   You may shave your face at any point before/day of surgery.   Place clean sheets on your bed the day you start using CHG soap. Use  a clean washcloth (not used since being washed) for each shower. DO NOT sleep with pets once you start using the CHG.   CHG Shower Instructions:  If you choose to wash your hair and private area, wash first with your normal shampoo/soap.  After you use shampoo/soap, rinse your hair and body thoroughly to remove shampoo/soap residue.  Turn the water OFF and apply about 3 tablespoons (45 ml) of CHG soap to a CLEAN washcloth.  Apply CHG soap ONLY FROM YOUR NECK DOWN TO YOUR TOES (washing for 3-5 minutes)  DO NOT use CHG soap on face, private areas, open wounds, or sores.  Pay special attention to the area where your surgery is being performed.  If you are having back surgery, having someone wash your back for you may be helpful.  Wait 2 minutes after CHG soap is applied, then you may rinse off the CHG soap.  Pat dry with a clean towel  Put on clean clothes/pajamas   If you choose to wear lotion, please use ONLY the CHG-compatible lotions on the back of this paper.     Additional instructions for the day of surgery: DO NOT APPLY any lotions, deodorants, cologne, or perfumes.   Put on clean/comfortable clothes.  Brush your teeth.  Ask your nurse before applying any prescription medications to the skin.      CHG Compatible Lotions   Aveeno Moisturizing lotion  Cetaphil Moisturizing Cream  Cetaphil Moisturizing Lotion  Clairol Herbal Essence Moisturizing Lotion, Dry Skin  Clairol Herbal Essence Moisturizing Lotion, Extra Dry Skin  Clairol Herbal Essence Moisturizing Lotion, Normal Skin  Curel Age Defying Therapeutic Moisturizing Lotion with Alpha Hydroxy  Curel Extreme Care Body Lotion  Curel Soothing Hands Moisturizing Hand Lotion  Curel Therapeutic Moisturizing Cream, Fragrance-Free  Curel Therapeutic Moisturizing Lotion, Fragrance-Free  Curel Therapeutic Moisturizing Lotion, Original Formula  Eucerin Daily Replenishing Lotion  Eucerin Dry Skin Therapy Plus Alpha Hydroxy Crme   Eucerin Dry Skin Therapy Plus Alpha Hydroxy Lotion  Eucerin Original Crme  Eucerin Original Lotion  Eucerin Plus Crme Eucerin Plus Lotion  Eucerin TriLipid Replenishing Lotion  Keri Anti-Bacterial Hand Lotion  Keri Deep Conditioning Original Lotion Dry Skin Formula Softly Scented  Keri Deep Conditioning Original Lotion, Fragrance Free Sensitive Skin Formula  Keri Lotion Fast Absorbing Fragrance Free Sensitive Skin Formula  Keri Lotion Fast Absorbing Softly Scented Dry Skin Formula  Keri Original Lotion  Keri Skin Renewal Lotion Keri Silky Smooth Lotion  Keri Silky Smooth Sensitive Skin Lotion  Nivea Body Creamy Conditioning Oil  Nivea Body Extra Enriched Lotion  Nivea Body Original Lotion  Nivea Body Sheer Moisturizing Lotion Nivea Crme  Nivea Skin Firming Lotion  NutraDerm 30 Skin Lotion  NutraDerm Skin Lotion  NutraDerm Therapeutic Skin Cream  NutraDerm Therapeutic Skin Lotion  ProShield Protective Hand Cream  Provon moisturizing lotion   FAILURE TO FOLLOW THESE INSTRUCTIONS MAY RESULT IN THE CANCELLATION OF YOUR SURGERY  PATIENT SIGNATURE_________________________________  NURSE SIGNATURE__________________________________  ________________________________________________________________________     Rogelia Mire    An incentive spirometer is a tool that can help keep your lungs clear and active. This tool measures how well you are filling your lungs with each breath. Taking long deep  breaths may help reverse or decrease the chance of developing breathing (pulmonary) problems (especially infection) following: A long period of time when you are unable to move or be active. BEFORE THE PROCEDURE  If the spirometer includes an indicator to show your best effort, your nurse or respiratory therapist will set it to a desired goal. If possible, sit up straight or lean slightly forward. Try not to slouch. Hold the incentive spirometer in an upright  position. INSTRUCTIONS FOR USE  Sit on the edge of your bed if possible, or sit up as far as you can in bed or on a chair. Hold the incentive spirometer in an upright position. Breathe out normally. Place the mouthpiece in your mouth and seal your lips tightly around it. Breathe in slowly and as deeply as possible, raising the piston or the ball toward the top of the column. Hold your breath for 3-5 seconds or for as long as possible. Allow the piston or ball to fall to the bottom of the column. Remove the mouthpiece from your mouth and breathe out normally. Rest for a few seconds and repeat Steps 1 through 7 at least 10 times every 1-2 hours when you are awake. Take your time and take a few normal breaths between deep breaths. The spirometer may include an indicator to show your best effort. Use the indicator as a goal to work toward during each repetition. After each set of 10 deep breaths, practice coughing to be sure your lungs are clear. If you have an incision (the cut made at the time of surgery), support your incision when coughing by placing a pillow or rolled up towels firmly against it. Once you are able to get out of bed, walk around indoors and cough well. You may stop using the incentive spirometer when instructed by your caregiver.  RISKS AND COMPLICATIONS Take your time so you do not get dizzy or light-headed. If you are in pain, you may need to take or ask for pain medication before doing incentive spirometry. It is harder to take a deep breath if you are having pain. AFTER USE Rest and breathe slowly and easily. It can be helpful to keep track of a log of your progress. Your caregiver can provide you with a simple table to help with this. If you are using the spirometer at home, follow these instructions: SEEK MEDICAL CARE IF:  You are having difficultly using the spirometer. You have trouble using the spirometer as often as instructed. Your pain medication is not giving  enough relief while using the spirometer. You develop fever of 100.5 F (38.1 C) or higher.                                                                                                    SEEK IMMEDIATE MEDICAL CARE IF:  You cough up bloody sputum that had not been present before. You develop fever of 102 F (38.9 C) or greater. You develop worsening pain at or near the incision site. MAKE SURE YOU:  Understand these instructions. Will watch your condition. Will get  help right away if you are not doing well or get worse. Document Released: 02/21/2007 Document Revised: 01/03/2012 Document Reviewed: 04/24/2007 [facility] Patient Information 2014 [place], [place].      WHAT IS A BLOOD TRANSFUSION? Blood Transfusion Information  A transfusion is the replacement of blood or some of its parts. Blood is made up of multiple cells which provide different functions. Red blood cells carry oxygen and are used for blood loss replacement. White blood cells fight against infection. Platelets control bleeding. Plasma helps clot blood. Other blood products are available for specialized needs, such as hemophilia or other clotting disorders. BEFORE THE TRANSFUSION  Who gives blood for transfusions?  Healthy volunteers who are fully evaluated to make sure their blood is safe. This is blood bank blood. Transfusion therapy is the safest it has ever been in the practice of medicine. Before blood is taken from a donor, a complete history is taken to make sure that person has no history of diseases nor engages in risky social behavior (examples are intravenous drug use or sexual activity with multiple partners). The donor's travel history is screened to minimize risk of transmitting infections, such as malaria. The donated blood is tested for signs of infectious diseases, such as HIV and hepatitis. The blood is then tested to be sure it is compatible with you in order to minimize the chance of a transfusion  reaction. If you or a relative donates blood, this is often done in anticipation of surgery and is not appropriate for emergency situations. It takes many days to process the donated blood. RISKS AND COMPLICATIONS Although transfusion therapy is very safe and saves many lives, the main dangers of transfusion include:  Getting an infectious disease. Developing a transfusion reaction. This is an allergic reaction to something in the blood you were given. Every precaution is taken to prevent this. The decision to have a blood transfusion has been considered carefully by your caregiver before blood is given. Blood is not given unless the benefits outweigh the risks. AFTER THE TRANSFUSION Right after receiving a blood transfusion, you will usually feel much better and more energetic. This is especially true if your red blood cells have gotten low (anemic). The transfusion raises the level of the red blood cells which carry oxygen, and this usually causes an energy increase. The nurse administering the transfusion will monitor you carefully for complications. HOME CARE INSTRUCTIONS  No special instructions are needed after a transfusion. You may find your energy is better. Speak with your caregiver about any limitations on activity for underlying diseases you may have. SEEK MEDICAL CARE IF:  Your condition is not improving after your transfusion. You develop redness or irritation at the intravenous (IV) site. SEEK IMMEDIATE MEDICAL CARE IF:  Any of the following symptoms occur over the next 12 hours: Shaking chills. You have a temperature by mouth above 102 F (38.9 C), not controlled by medicine. Chest, back, or muscle pain. People around you feel you are not acting correctly or are confused. Shortness of breath or difficulty breathing. Dizziness and fainting. You get a rash or develop hives. You have a decrease in urine output. Your urine turns a dark color or changes to pink, red, or  brown. Any of the following symptoms occur over the next 10 days: You have a temperature by mouth above 102 F (38.9 C), not controlled by medicine. Shortness of breath. Weakness after normal activity. The white part of the eye turns yellow (jaundice). You have a decrease in  the amount of urine or are urinating less often. Your urine turns a dark color or changes to pink, red, or brown. Document Released: 10/08/2000 Document Revised: 01/03/2012 Document Reviewed: 05/27/2008 [facility] Patient Information 2014 [place], [place].  _______________________________________________________________________

## 2023-05-18 NOTE — Progress Notes (Addendum)
COVID Vaccine received:  []  No []  Yes Date of any COVID positive Test in last 90 days:  PCP - Oliver Barre, MD Cardiologist -  Urology- Jerilee Field, MD  clearance on chart  Chest x-ray - 04-10-2021  2v   Epic EKG - 2015  Epic   will repeat at PST  Stress Test -  ECHO -  Cardiac Cath -   PCR screen: [x]  Ordered & Completed           []   No Order but Needs PROFEND           []   N/A for this surgery  Surgery Plan:  []  Ambulatory                            [x]  Outpatient in bed                            []  Admit  Anesthesia:    []  General  [x]  Spinal                           []   Choice []   MAC  Pacemaker / ICD device [x]  No []  Yes   Spinal Cord Stimulator:[x]  No []  Yes       History of Sleep Apnea? [x]  No []  Yes   CPAP used?- [x]  No []  Yes    Does the patient monitor blood sugar?          []  No []  Yes  [x]  N/A  Patient has: [x]  NO Hx DM   []  Pre-DM                 []  DM1  []   DM2  Blood Thinner / Instructions:  none Aspirin Instructions:  none  ERAS Protocol Ordered: []  No  [x]  Yes PRE-SURGERY [x]  ENSURE  []  G2  Patient is to be NPO after: 08:15 am  Comments: Patient was given the 5 CHG shower / bath instructions for TKA  surgery along with 2 bottles of the CHG soap. Patient will start this on:  Saturday  05-28-2023    All questions were asked and answered, Patient voiced understanding of this process.   Activity level: Patient is able / unable to climb a flight of stairs without difficulty; []  No CP  []  No SOB, but would have ___   Patient can / can not perform ADLs without assistance.   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  Patient verbalized understanding and agreement to the Pre-Surgical Instructions that were given to them at this PAT appointment. Patient was also educated of the need to review  these PAT instructions again prior to her surgery.I reviewed the appropriate phone numbers to call if they have any and questions or concerns.

## 2023-05-19 ENCOUNTER — Encounter (HOSPITAL_COMMUNITY)
Admission: RE | Admit: 2023-05-19 | Discharge: 2023-05-19 | Disposition: A | Discharge status: Home / Self Care | Payer: PPO | Source: Ambulatory Visit | Attending: Orthopedic Surgery | Admitting: Orthopedic Surgery

## 2023-05-19 DIAGNOSIS — I10 Essential (primary) hypertension: Secondary | ICD-10-CM

## 2023-05-19 DIAGNOSIS — Z01818 Encounter for other preprocedural examination: Secondary | ICD-10-CM

## 2023-05-24 ENCOUNTER — Telehealth: Payer: Self-pay | Admitting: Radiology

## 2023-05-24 NOTE — Telephone Encounter (Signed)
Contacted Denyse Amass to schedule their annual wellness visit. Call back at later date: 07/25/23 Pt wants to wait till after she gets her Knee replacement done.    K. CMA

## 2023-05-31 ENCOUNTER — Ambulatory Visit (INDEPENDENT_AMBULATORY_CARE_PROVIDER_SITE_OTHER): Payer: PPO | Admitting: Internal Medicine

## 2023-05-31 ENCOUNTER — Encounter: Payer: Self-pay | Admitting: Internal Medicine

## 2023-05-31 VITALS — BP 122/80 | HR 79 | Temp 98.0°F | Ht 65.0 in | Wt 174.0 lb

## 2023-05-31 DIAGNOSIS — N1831 Chronic kidney disease, stage 3a: Secondary | ICD-10-CM

## 2023-05-31 DIAGNOSIS — I10 Essential (primary) hypertension: Secondary | ICD-10-CM | POA: Diagnosis not present

## 2023-05-31 DIAGNOSIS — J309 Allergic rhinitis, unspecified: Secondary | ICD-10-CM

## 2023-05-31 DIAGNOSIS — S0993XA Unspecified injury of face, initial encounter: Secondary | ICD-10-CM

## 2023-05-31 NOTE — Progress Notes (Signed)
Patient ID: Joan Townsend, female   DOB: [birth year], 82 y.o.   MRN: [id number]        Chief Complaint: follow up recent fall on stairs with facial pain after stumble and fall going up stairs       HPI:  Joan Townsend is a 82 y.o. female here with c/o above x 3 days with a small deformity to nasal bridge, with bruising about the eyes and nasal, swelling to upper lip as well.  No abrasions or cellulitis.  Pt denies chest pain, increased sob or doe, wheezing, orthopnea, PND, increased LE swelling, palpitations, dizziness or syncope.   Pt denies polydipsia, polyuria, or new focal neuro s/s.          Wt Readings from Last 3 Encounters:  05/31/23 174 lb (78.9 kg)  08/30/22 170 lb 4 oz (77.2 kg)  08/12/22 173 lb (78.5 kg)   BP Readings from Last 3 Encounters:  05/31/23 122/80  08/30/22 130/70  08/12/22 124/76         Past Medical History:  Diagnosis Date   Acquired solitary kidney    s/p right nephrectomy   Angio-edema    Endometriosis    Hemorrhoids    Hepatitis    result of Scarlet fever   History of scarlet fever    Hyperlipidemia 06/29/2012   Hypothyroidism    HYPOTHYROIDISM 01/05/2011   Qualifier: Diagnosis of  By: Leone Payor MD, Charlyne Quale    Seasonal allergies    Urticaria    Past Surgical History:  Procedure Laterality Date   ABDOMINAL HYSTERECTOMY     APPENDECTOMY     CHOLECYSTECTOMY  2012   COLON RESECTION     part of endometriosis surgery   COLONOSCOPY  2006   hemorrhoids(Dr. Virginia Rochester)   HEMORRHOID BANDING  2014   NEPHRECTOMY     right, due to birth deformity    reports that she quit smoking about 44 years ago. Her smoking use included cigarettes. She has never used smokeless tobacco. She reports current alcohol use. She reports that she does not use drugs. family history includes Breast cancer in an other family member; Colon cancer in an other family member; Heart disease in her maternal grandfather; Lung cancer in her sister; Ovarian cancer in an other family member;  Uterine cancer in her mother. Allergies  Allergen Reactions   Ciprofloxacin Rash    REACTION: rash    Clindamycin/Lincomycin Anaphylaxis   Penicillins Rash    REACTION: rash    Sulfa Antibiotics     Hives   Sulfacetamide Sodium Hives    Hives   Cefaclor    Naproxen Other (See Comments)    Facial swelling   Current Outpatient Medications on File Prior to Visit  Medication Sig Dispense Refill   estradiol (ESTRACE) 1 MG tablet Take 1 tablet (1 mg total) by mouth daily. 90 tablet 3   levothyroxine (SYNTHROID) 125 MCG tablet Take 125 mcg by mouth daily.     fluticasone (FLONASE) 50 MCG/ACT nasal spray One spray per nostril on Monday/Wednesday/ Friday (Patient not taking: Reported on 05/16/2023) 16 g 5   OVER THE COUNTER MEDICATION Take 1 capsule by mouth daily. Doterra onguard (Patient not taking: Reported on 05/31/2023)     OVER THE COUNTER MEDICATION Take 1 capsule by mouth daily. Renette Butters Re-leaf (Patient not taking: Reported on 05/31/2023)     OVER THE COUNTER MEDICATION Take 1 tablet by mouth daily. Renew Thyroid (Patient not taking: Reported on 05/31/2023)  No current facility-administered medications on file prior to visit.        ROS:  All others reviewed and negative.  Objective        PE:  BP 122/80 (BP Location: Left Arm, Patient Position: Sitting, Cuff Size: Normal)   Pulse 79   Temp 98 F (36.7 C) (Oral)   Ht 5\' 5"  (1.651 m)   Wt 174 lb (78.9 kg)   SpO2 98%   BMI 28.96 kg/m                 Constitutional: Pt appears in NAD               HENT: nasal bridge with mild bony abnormality, with bruising swelling about the eyes and nasal               Right Ear: External ear normal.                 Left Ear: External ear normal.                Eyes: . Pupils are equal, round, and reactive to light. Conjunctivae and EOM are normal               Nose: without d/c or deformity               Neck: Neck supple. Gross normal ROM               Cardiovascular: Normal rate and  regular rhythm.                 Pulmonary/Chest: Effort normal and breath sounds without rales or wheezing.                               Neurological: Pt is alert. At baseline orientation, motor grossly intact               Skin: Skin is warm. No rashes, no other new lesions, LE edema - none               Psychiatric: Pt behavior is normal without agitation   Micro: none  Cardiac tracings I have personally interpreted today:  none  Pertinent Radiological findings (summarize): none   Lab Results  Component Value Date   WBC 7.7 08/14/2020   HGB 14.5 08/14/2020   HCT 44.2 08/14/2020   PLT 269.0 08/14/2020   GLUCOSE 98 08/14/2020   CHOL 229 (H) 08/14/2020   TRIG 96.0 08/14/2020   HDL 77.40 08/14/2020   LDLDIRECT 121.5 06/29/2012   LDLCALC 133 (H) 08/14/2020   ALT 10 08/14/2020   AST 21 08/14/2020   NA 141 08/14/2020   K 6.4 No hemolysis seen (HH) 08/14/2020   CL 103 08/14/2020   CREATININE 1.36 (H) 08/14/2020   BUN 17 08/14/2020   CO2 31 08/14/2020   TSH 3.42 08/14/2020   INR 1.0 11/07/2018   Assessment/Plan:  Joan Townsend is a 82 y.o. White or Caucasian [1] female with  has a past medical history of Acquired solitary kidney, Angio-edema, Endometriosis, Hemorrhoids, Hepatitis, History of scarlet fever, Hyperlipidemia (06/29/2012), Hypothyroidism, HYPOTHYROIDISM (01/05/2011), Seasonal allergies, and Urticaria.  Injury of face With mild nasal bridge abnormality, breathing and nasal air moveement adequate, no tongue or teeth abnormal, now for CT sinus r/o fx or sinus abnormality  Essential hypertension BP Readings from Last 3 Encounters:  05/31/23 122/80  08/30/22 130/70  08/12/22 124/76   Stable, pt to continue medical treatment  - diet, wt control   CKD (chronic kidney disease) stage 3, GFR 30-59 ml/min (HCC) Lab Results  Component Value Date   CREATININE 1.36 (H) 08/14/2020   Stable overall, cont to avoid nephrotoxins, declines f/u lab today  Allergic  rhinitis Stable overall,  to f/u any worsening symptoms or concerns    Followup: Return if symptoms worsen or fail to improve.  Oliver Barre, MD 05/31/2023 4:48 PM [facility] [place] [facility]

## 2023-05-31 NOTE — Assessment & Plan Note (Signed)
With mild nasal bridge abnormality, breathing and nasal air moveement adequate, no tongue or teeth abnormal, now for CT sinus r/o fx or sinus abnormality

## 2023-05-31 NOTE — Patient Instructions (Signed)
Please take all new medication as prescribed - the CT sinus/nasal  Please continue all other medications as before, and refills have been done if requested.  Please have the pharmacy call with any other refills you may need.  Please keep your appointments with your specialists as you may have planned

## 2023-05-31 NOTE — Assessment & Plan Note (Signed)
BP Readings from Last 3 Encounters:  05/31/23 122/80  08/30/22 130/70  08/12/22 124/76   Stable, pt to continue medical treatment  - diet, wt control

## 2023-05-31 NOTE — Assessment & Plan Note (Signed)
Stable overall,  to f/u any worsening symptoms or concerns 

## 2023-05-31 NOTE — Assessment & Plan Note (Signed)
Lab Results  Component Value Date   CREATININE 1.36 (H) 08/14/2020   Stable overall, cont to avoid nephrotoxins, declines f/u lab today

## 2023-06-01 ENCOUNTER — Ambulatory Visit: Admit: 2023-06-01 | Payer: PPO | Admitting: Orthopedic Surgery

## 2023-06-01 SURGERY — ARTHROPLASTY, KNEE, UNICOMPARTMENTAL
Anesthesia: Spinal | Site: Knee | Laterality: Right

## 2023-06-03 ENCOUNTER — Inpatient Hospital Stay: Admission: RE | Admit: 2023-06-03 | Payer: PPO | Source: Ambulatory Visit

## 2023-06-03 ENCOUNTER — Other Ambulatory Visit: Payer: PPO

## 2023-06-03 ENCOUNTER — Ambulatory Visit
Admission: RE | Admit: 2023-06-03 | Discharge: 2023-06-03 | Disposition: A | Discharge status: Home / Self Care | Payer: PPO | Source: Ambulatory Visit | Attending: Internal Medicine | Admitting: Internal Medicine

## 2023-06-03 DIAGNOSIS — S0993XA Unspecified injury of face, initial encounter: Secondary | ICD-10-CM

## 2023-06-21 ENCOUNTER — Ambulatory Visit: Payer: PPO | Admitting: Internal Medicine

## 2023-08-18 ENCOUNTER — Ambulatory Visit (INDEPENDENT_AMBULATORY_CARE_PROVIDER_SITE_OTHER): Payer: PPO | Admitting: Internal Medicine

## 2023-08-18 ENCOUNTER — Encounter: Payer: Self-pay | Admitting: Internal Medicine

## 2023-08-18 VITALS — BP 120/72 | HR 75 | Temp 97.9°F | Ht 65.0 in | Wt 176.0 lb

## 2023-08-18 DIAGNOSIS — J302 Other seasonal allergic rhinitis: Secondary | ICD-10-CM

## 2023-08-18 DIAGNOSIS — Z0001 Encounter for general adult medical examination with abnormal findings: Secondary | ICD-10-CM

## 2023-08-18 DIAGNOSIS — J3089 Other allergic rhinitis: Secondary | ICD-10-CM

## 2023-08-18 DIAGNOSIS — E559 Vitamin D deficiency, unspecified: Secondary | ICD-10-CM | POA: Diagnosis not present

## 2023-08-18 DIAGNOSIS — E039 Hypothyroidism, unspecified: Secondary | ICD-10-CM | POA: Diagnosis not present

## 2023-08-18 DIAGNOSIS — I10 Essential (primary) hypertension: Secondary | ICD-10-CM | POA: Diagnosis not present

## 2023-08-18 DIAGNOSIS — G4710 Hypersomnia, unspecified: Secondary | ICD-10-CM | POA: Diagnosis not present

## 2023-08-18 DIAGNOSIS — E538 Deficiency of other specified B group vitamins: Secondary | ICD-10-CM

## 2023-08-18 DIAGNOSIS — N1831 Chronic kidney disease, stage 3a: Secondary | ICD-10-CM | POA: Diagnosis not present

## 2023-08-18 DIAGNOSIS — E7800 Pure hypercholesterolemia, unspecified: Secondary | ICD-10-CM

## 2023-08-18 DIAGNOSIS — R052 Subacute cough: Secondary | ICD-10-CM

## 2023-08-18 DIAGNOSIS — R739 Hyperglycemia, unspecified: Secondary | ICD-10-CM | POA: Diagnosis not present

## 2023-08-18 LAB — CBC WITH DIFFERENTIAL/PLATELET
Basophils Absolute: 0.1 10*3/uL (ref 0.0–0.1)
Basophils Relative: 1.2 % (ref 0.0–3.0)
Eosinophils Absolute: 0.1 10*3/uL (ref 0.0–0.7)
Eosinophils Relative: 1.7 % (ref 0.0–5.0)
HCT: 42.8 % (ref 36.0–46.0)
Hemoglobin: 13.7 g/dL (ref 12.0–15.0)
Lymphocytes Relative: 26.3 % (ref 12.0–46.0)
Lymphs Abs: 2 10*3/uL (ref 0.7–4.0)
MCHC: 31.9 g/dL (ref 30.0–36.0)
MCV: 89.6 fL (ref 78.0–100.0)
Monocytes Absolute: 0.5 10*3/uL (ref 0.1–1.0)
Monocytes Relative: 7.1 % (ref 3.0–12.0)
Neutro Abs: 4.7 10*3/uL (ref 1.4–7.7)
Neutrophils Relative %: 63.7 % (ref 43.0–77.0)
Platelets: 277 10*3/uL (ref 150.0–400.0)
RBC: 4.78 Mil/uL (ref 3.87–5.11)
RDW: 14.6 % (ref 11.5–15.5)
WBC: 7.4 10*3/uL (ref 4.0–10.5)

## 2023-08-18 LAB — BASIC METABOLIC PANEL
BUN: 14 mg/dL (ref 6–23)
CO2: 31 meq/L (ref 19–32)
Calcium: 9.5 mg/dL (ref 8.4–10.5)
Chloride: 105 meq/L (ref 96–112)
Creatinine, Ser: 1.08 mg/dL (ref 0.40–1.20)
GFR: 47.09 mL/min — ABNORMAL LOW (ref >60.00)
Glucose, Bld: 94 mg/dL (ref 70–99)
Potassium: 5.1 meq/L (ref 3.5–5.1)
Sodium: 141 meq/L (ref 135–145)

## 2023-08-18 LAB — HEPATIC FUNCTION PANEL
ALT: 7 U/L (ref 0–35)
AST: 12 U/L (ref 0–37)
Albumin: 4 g/dL (ref 3.5–5.2)
Alkaline Phosphatase: 81 U/L (ref 39–117)
Bilirubin, Direct: 0.1 mg/dL (ref 0.0–0.3)
Total Bilirubin: 0.4 mg/dL (ref 0.2–1.2)
Total Protein: 7.1 g/dL (ref 6.0–8.3)

## 2023-08-18 LAB — LIPID PANEL
Cholesterol: 212 mg/dL — ABNORMAL HIGH (ref 0–200)
HDL: 70.2 mg/dL (ref >39.00)
LDL Cholesterol: 121 mg/dL — ABNORMAL HIGH (ref 0–99)
NonHDL: 141.74
Total CHOL/HDL Ratio: 3
Triglycerides: 105 mg/dL (ref 0.0–149.0)
VLDL: 21 mg/dL (ref 0.0–40.0)

## 2023-08-18 LAB — TSH: TSH: 0.13 u[IU]/mL — ABNORMAL LOW (ref 0.35–5.50)

## 2023-08-18 LAB — HEMOGLOBIN A1C: Hgb A1c MFr Bld: 5.6 % (ref 4.6–6.5)

## 2023-08-18 MED ORDER — PANTOPRAZOLE SODIUM 40 MG PO TBEC: 40.0000 mg | DELAYED_RELEASE_TABLET | Freq: Every day | ORAL | 3 refills | Status: AC

## 2023-08-18 NOTE — Progress Notes (Signed)
Chief Complaint:: wellness exam and Fatigue (And no energy has been going on the whole summer )  , hypersomnolence, cough and reflux, allergies,        HPI:  Joan Townsend is a 82 y.o. female here for wellness exam; declines all immunizations, o/w up to date                        Also daughter with MI at 47yo, also her husband and father before that, was quite a shock to herl  Denies worsening depressive symptoms, suicidal ideation, or panic; has ongoing anxiety.  Does c/o ongoing fatigue, but denies signficant daytime hypersomnolence.  Sees urology once yearly last July and ok checkup.  Denies urinary symptoms such as dysuria, frequency, urgency, flank pain, hematuria or n/v, fever, chills.  Pt denies chest pain, increased sob or doe, wheezing, orthopnea, PND, increased LE swelling, palpitations, dizziness or syncope.  Does c/o ongoing fatigue, and has signficant daytime hypersomnolence.  Also had 1 mo persistent dry cough and mild reflux symptoms.  Does have several wks ongoing nasal allergy symptoms with clearish congestion, itch and sneezing, without fever, pain, ST, cough, swelling or wheezing.   Wt Readings from Last 3 Encounters:  08/18/23 176 lb (79.8 kg)  05/31/23 174 lb (78.9 kg)  08/30/22 170 lb 4 oz (77.2 kg)   BP Readings from Last 3 Encounters:  08/18/23 120/72  05/31/23 122/80  08/30/22 130/70   Immunization History  Administered Date(s) Administered   Tdap 08/23/2017   Health Maintenance Due  Topic Date Due   Medicare Annual Wellness (AWV)  Never done   COVID-19 Vaccine (1) Never done   Zoster Vaccines- Shingrix (1 of 2) Never done   Pneumonia Vaccine 35+ Years old (1 of 1 - PCV) Never done   INFLUENZA VACCINE  Never done      Past Medical History:  Diagnosis Date   Acquired solitary kidney    s/p right nephrectomy   Angio-edema    Endometriosis    Hemorrhoids    Hepatitis    result of Scarlet fever   History of scarlet fever    Hyperlipidemia  06/29/2012   Hypothyroidism    HYPOTHYROIDISM 01/05/2011   Qualifier: Diagnosis of  By: Leone Payor MD, Alfonse Ras E    Seasonal allergies    Urticaria    Past Surgical History:  Procedure Laterality Date   ABDOMINAL HYSTERECTOMY     APPENDECTOMY     CHOLECYSTECTOMY  2012   COLON RESECTION     part of endometriosis surgery   COLONOSCOPY  2006   hemorrhoids(Dr. Virginia Rochester)   HEMORRHOID BANDING  2014   NEPHRECTOMY     right, due to birth deformity    reports that she quit smoking about 44 years ago. Her smoking use included cigarettes. She has never used smokeless tobacco. She reports current alcohol use. She reports that she does not use drugs. family history includes Breast cancer in an other family member; Colon cancer in an other family member; Heart disease in her maternal grandfather; Lung cancer in her sister; Ovarian cancer in an other family member; Uterine cancer in her mother. Allergies  Allergen Reactions   Ciprofloxacin Rash    REACTION: rash    Clindamycin/Lincomycin Anaphylaxis   Penicillins Rash    REACTION: rash    Sulfa Antibiotics     Hives   Sulfacetamide Sodium Hives    Hives   Cefaclor  Naproxen Other (See Comments)    Facial swelling   Current Outpatient Medications on File Prior to Visit  Medication Sig Dispense Refill   estradiol (ESTRACE) 1 MG tablet Take 1 tablet (1 mg total) by mouth daily. 90 tablet 3   No current facility-administered medications on file prior to visit.        ROS:  All others reviewed and negative.  Objective        PE:  BP 120/72 (BP Location: Right Arm, Patient Position: Sitting, Cuff Size: Normal)   Pulse 75   Temp 97.9 F (36.6 C) (Oral)   Ht 5\' 5"  (1.651 m)   Wt 176 lb (79.8 kg)   SpO2 98%   BMI 29.29 kg/m                 Constitutional: Pt appears in NAD               HENT: Head: NCAT.                Right Ear: External ear normal.                 Left Ear: External ear normal.                Eyes: . Pupils are  equal, round, and reactive to light. Conjunctivae and EOM are normal               Nose: without d/c or deformity               Neck: Neck supple. Gross normal ROM               Cardiovascular: Normal rate and regular rhythm.                 Pulmonary/Chest: Effort normal and breath sounds without rales or wheezing.                Abd:  Soft, NT, ND, + BS, no organomegaly               Neurological: Pt is alert. At baseline orientation, motor grossly intact               Skin: Skin is warm. No rashes, no other new lesions, LE edema - none               Psychiatric: Pt behavior is normal without agitation   Micro: none  Cardiac tracings I have personally interpreted today:  none  Pertinent Radiological findings (summarize): none   Lab Results  Component Value Date   WBC 7.4 08/18/2023   HGB 13.7 08/18/2023   HCT 42.8 08/18/2023   PLT 277.0 08/18/2023   GLUCOSE 94 08/18/2023   CHOL 212 (H) 08/18/2023   TRIG 105.0 08/18/2023   HDL 70.20 08/18/2023   LDLDIRECT 121.5 06/29/2012   LDLCALC 121 (H) 08/18/2023   ALT 7 08/18/2023   AST 12 08/18/2023   NA 141 08/18/2023   K 5.1 08/18/2023   CL 105 08/18/2023   CREATININE 1.08 08/18/2023   BUN 14 08/18/2023   CO2 31 08/18/2023   TSH 0.13 (L) 08/18/2023   INR 1.0 11/07/2018   HGBA1C 5.6 08/18/2023   Assessment/Plan:  Joan Townsend is a 82 y.o. White or Caucasian [1] female with  has a past medical history of Acquired solitary kidney, Angio-edema, Endometriosis, Hemorrhoids, Hepatitis, History of scarlet fever, Hyperlipidemia (06/29/2012), Hypothyroidism, HYPOTHYROIDISM (01/05/2011), Seasonal allergies, and Urticaria.  CKD (  chronic kidney disease) stage 3, GFR 30-59 ml/min (HCC) Lab Results  Component Value Date   CREATININE 1.08 08/18/2023   Stable overall, cont to avoid nephrotoxins, has solitary kidney  Encounter for well adult exam with abnormal findings Age and sex appropriate education and counseling updated with regular  exercise and diet Referrals for preventative services - none needed Immunizations addressed - declines covid, flu, pneumovax and shingrix at pharmacy Smoking counseling  - none needed Evidence for depression or other mood disorder - none significant Most recent labs reviewed. I have personally reviewed and have noted: 1) the patient's medical and social history 2) The patient's current medications and supplements 3) The patient's height, weight, and BMI have been recorded in the chart   Hyperlipidemia Lab Results  Component Value Date   LDLCALC 121 (H) 08/18/2023   Uncontrolled,, pt to start crestor 20 qd   Essential hypertension BP Readings from Last 3 Encounters:  08/18/23 120/72  05/31/23 122/80  08/30/22 130/70   Stable, pt to continue medical treatment   - diet, wt control   Allergic rhinitis Mild to mod, for allegra 180 every day prn, nasacort asd,,  to f/u any worsening symptoms or concerns  Cough Mild to mod, for trial protonix 40 qd,  to f/u any worsening symptoms or concerns  Hypothyroidism Lab Results  Component Value Date   TSH 0.13 (L) 08/18/2023   overcontrolled, pt to continue levothyroxine reduced from 125 to 112 mcg per day   Hypersomnia With daytime fatigue - for pulm referral r/o osa  Seasonal and perennial allergic rhinitis .mild worsening, for allegra 180 every day prn, and nasacort asd,  to f/u any worsening symptoms or concerns  Followup: Return in about 6 months (around 02/16/2024).  Oliver Barre, MD 08/21/2023 11:41 AM [facility] - [facility]

## 2023-08-18 NOTE — Patient Instructions (Signed)
Please take all new medication as prescribed - the protonix 40 mg per day  Please take all new medication as recommended OTC - allegra and nasacort  Please call if you change your mind about the referral to Pulmonary to look into possible sleep apnea  Please continue all other medications as before, and refills have been done if requested.  Please have the pharmacy call with any other refills you may need.  Please continue your efforts at being more active, low cholesterol diet, and weight control.  You are otherwise up to date with prevention measures today.  Please keep your appointments with your specialists as you may have planned  Please go to the LAB at the blood drawing area for the tests to be done  You will be contacted by phone if any changes need to be made immediately.  Otherwise, you will receive a letter about your results with an explanation, but please check with MyChart first.  Please make an Appointment to return in 6 months, or sooner if needed

## 2023-08-19 ENCOUNTER — Other Ambulatory Visit: Payer: Self-pay | Admitting: Internal Medicine

## 2023-08-19 LAB — VITAMIN D 25 HYDROXY (VIT D DEFICIENCY, FRACTURES): VITD: 68.07 ng/mL (ref 30.00–100.00)

## 2023-08-19 LAB — VITAMIN B12: Vitamin B-12: 1045 pg/mL — ABNORMAL HIGH (ref 211–911)

## 2023-08-19 MED ORDER — ROSUVASTATIN CALCIUM 10 MG PO TABS: 10.0000 mg | ORAL_TABLET | Freq: Every day | ORAL | 3 refills | Status: DC

## 2023-08-19 MED ORDER — LEVOTHYROXINE SODIUM 112 MCG PO TABS: 112.0000 ug | ORAL_TABLET | Freq: Every day | ORAL | 3 refills | Status: DC

## 2023-08-21 ENCOUNTER — Encounter: Payer: Self-pay | Admitting: Internal Medicine

## 2023-08-21 DIAGNOSIS — Z0001 Encounter for general adult medical examination with abnormal findings: Secondary | ICD-10-CM | POA: Insufficient documentation

## 2023-08-21 NOTE — Assessment & Plan Note (Signed)
Lab Results  Component Value Date   CREATININE 1.08 08/18/2023   Stable overall, cont to avoid nephrotoxins, has solitary kidney

## 2023-08-21 NOTE — Assessment & Plan Note (Signed)
Mild to mod, for allegra 180 every day prn, nasacort asd,,  to f/u any worsening symptoms or concerns

## 2023-08-21 NOTE — Assessment & Plan Note (Signed)
BP Readings from Last 3 Encounters:  08/18/23 120/72  05/31/23 122/80  08/30/22 130/70   Stable, pt to continue medical treatment   - diet, wt control

## 2023-08-21 NOTE — Assessment & Plan Note (Signed)
With daytime fatigue - for pulm referral r/o osa

## 2023-08-21 NOTE — Assessment & Plan Note (Signed)
.  mild worsening, for allegra 180 every day prn, and nasacort asd,  to f/u any worsening symptoms or concerns

## 2023-08-21 NOTE — Assessment & Plan Note (Signed)
Lab Results  Component Value Date   LDLCALC 121 (H) 08/18/2023   Uncontrolled,, pt to start crestor 20 qd

## 2023-08-21 NOTE — Assessment & Plan Note (Signed)
Lab Results  Component Value Date   TSH 0.13 (L) 08/18/2023   overcontrolled, pt to continue levothyroxine reduced from 125 to 112 mcg per day

## 2023-08-21 NOTE — Assessment & Plan Note (Signed)
Age and sex appropriate education and counseling updated with regular exercise and diet Referrals for preventative services - none needed Immunizations addressed - declines covid, flu, pneumovax and shingrix at pharmacy Smoking counseling  - none needed Evidence for depression or other mood disorder - none significant Most recent labs reviewed. I have personally reviewed and have noted: 1) the patient's medical and social history 2) The patient's current medications and supplements 3) The patient's height, weight, and BMI have been recorded in the chart

## 2023-08-21 NOTE — Assessment & Plan Note (Signed)
Mild to mod, for trial protonix 40 qd,  to f/u any worsening symptoms or concerns

## 2023-08-22 ENCOUNTER — Other Ambulatory Visit: Payer: Self-pay | Admitting: Internal Medicine

## 2023-08-22 DIAGNOSIS — I10 Essential (primary) hypertension: Secondary | ICD-10-CM

## 2024-02-22 ENCOUNTER — Encounter: Payer: Self-pay | Admitting: Internal Medicine

## 2024-02-22 ENCOUNTER — Ambulatory Visit (INDEPENDENT_AMBULATORY_CARE_PROVIDER_SITE_OTHER): Payer: PPO | Admitting: Internal Medicine

## 2024-02-22 VITALS — BP 138/84 | HR 70 | Temp 97.7°F | Ht 65.0 in | Wt 176.0 lb

## 2024-02-22 DIAGNOSIS — E538 Deficiency of other specified B group vitamins: Secondary | ICD-10-CM | POA: Diagnosis not present

## 2024-02-22 DIAGNOSIS — E7800 Pure hypercholesterolemia, unspecified: Secondary | ICD-10-CM

## 2024-02-22 DIAGNOSIS — I10 Essential (primary) hypertension: Secondary | ICD-10-CM

## 2024-02-22 DIAGNOSIS — R739 Hyperglycemia, unspecified: Secondary | ICD-10-CM | POA: Diagnosis not present

## 2024-02-22 DIAGNOSIS — Z0000 Encounter for general adult medical examination without abnormal findings: Secondary | ICD-10-CM | POA: Diagnosis not present

## 2024-02-22 DIAGNOSIS — E559 Vitamin D deficiency, unspecified: Secondary | ICD-10-CM

## 2024-02-22 DIAGNOSIS — E039 Hypothyroidism, unspecified: Secondary | ICD-10-CM

## 2024-02-22 DIAGNOSIS — N1831 Chronic kidney disease, stage 3a: Secondary | ICD-10-CM | POA: Diagnosis not present

## 2024-02-22 DIAGNOSIS — Z0001 Encounter for general adult medical examination with abnormal findings: Secondary | ICD-10-CM

## 2024-02-22 LAB — URINALYSIS, ROUTINE W REFLEX MICROSCOPIC
Bilirubin Urine: NEGATIVE
Ketones, ur: NEGATIVE
Nitrite: NEGATIVE
Specific Gravity, Urine: <=1.005 — AB (ref 1.000–1.030)
Total Protein, Urine: NEGATIVE
Urine Glucose: NEGATIVE
Urobilinogen, UA: 0.2 (ref 0.0–1.0)
pH: 6 (ref 5.0–8.0)

## 2024-02-22 LAB — CBC WITH DIFFERENTIAL/PLATELET
Basophils Absolute: 0 10*3/uL (ref 0.0–0.1)
Basophils Relative: 0.6 % (ref 0.0–3.0)
Eosinophils Absolute: 0.1 10*3/uL (ref 0.0–0.7)
Eosinophils Relative: 1.4 % (ref 0.0–5.0)
HCT: 41.9 % (ref 36.0–46.0)
Hemoglobin: 13.8 g/dL (ref 12.0–15.0)
Lymphocytes Relative: 23.7 % (ref 12.0–46.0)
Lymphs Abs: 1.8 10*3/uL (ref 0.7–4.0)
MCHC: 33 g/dL (ref 30.0–36.0)
MCV: 88.9 fl (ref 78.0–100.0)
Monocytes Absolute: 0.6 10*3/uL (ref 0.1–1.0)
Monocytes Relative: 7.5 % (ref 3.0–12.0)
Neutro Abs: 5.1 10*3/uL (ref 1.4–7.7)
Neutrophils Relative %: 66.8 % (ref 43.0–77.0)
Platelets: 258 10*3/uL (ref 150.0–400.0)
RBC: 4.72 Mil/uL (ref 3.87–5.11)
RDW: 14.5 % (ref 11.5–15.5)
WBC: 7.6 10*3/uL (ref 4.0–10.5)

## 2024-02-22 LAB — HEPATIC FUNCTION PANEL
ALT: 10 U/L (ref 0–35)
AST: 15 U/L (ref 0–37)
Albumin: 4.1 g/dL (ref 3.5–5.2)
Alkaline Phosphatase: 78 U/L (ref 39–117)
Bilirubin, Direct: 0.2 mg/dL (ref 0.0–0.3)
Total Bilirubin: 0.4 mg/dL (ref 0.2–1.2)
Total Protein: 7 g/dL (ref 6.0–8.3)

## 2024-02-22 LAB — LIPID PANEL
Cholesterol: 230 mg/dL — ABNORMAL HIGH (ref 0–200)
HDL: 67.1 mg/dL (ref >39.00)
LDL Cholesterol: 144 mg/dL — ABNORMAL HIGH (ref 0–99)
NonHDL: 163.05
Total CHOL/HDL Ratio: 3
Triglycerides: 96 mg/dL (ref 0.0–149.0)
VLDL: 19.2 mg/dL (ref 0.0–40.0)

## 2024-02-22 LAB — THYROID PANEL WITH TSH
Free Thyroxine Index: 4.2 — ABNORMAL HIGH (ref 1.4–3.8)
T3 Uptake: 32 % (ref 22–35)
T4, Total: 13.2 ug/dL — ABNORMAL HIGH (ref 5.1–11.9)
TSH: 0.27 m[IU]/L — ABNORMAL LOW (ref 0.40–4.50)

## 2024-02-22 LAB — BASIC METABOLIC PANEL WITH GFR
BUN: 16 mg/dL (ref 6–23)
CO2: 30 meq/L (ref 19–32)
Calcium: 9.3 mg/dL (ref 8.4–10.5)
Chloride: 103 meq/L (ref 96–112)
Creatinine, Ser: 1.13 mg/dL (ref 0.40–1.20)
GFR: 44.44 mL/min — ABNORMAL LOW (ref >60.00)
Glucose, Bld: 99 mg/dL (ref 70–99)
Potassium: 4.2 meq/L (ref 3.5–5.1)
Sodium: 140 meq/L (ref 135–145)

## 2024-02-22 LAB — VITAMIN D 25 HYDROXY (VIT D DEFICIENCY, FRACTURES): VITD: 60.95 ng/mL (ref 30.00–100.00)

## 2024-02-22 LAB — VITAMIN B12: Vitamin B-12: 720 pg/mL (ref 211–911)

## 2024-02-22 LAB — HEMOGLOBIN A1C: Hgb A1c MFr Bld: 5.7 % (ref 4.6–6.5)

## 2024-02-22 NOTE — Assessment & Plan Note (Signed)
 Lab Results  Component Value Date   TSH 0.13 (L) 08/18/2023   overcontrolled, pt to continue levothyroxine  112 mcg every day, and f/u lab today

## 2024-02-22 NOTE — Assessment & Plan Note (Signed)
 BP Readings from Last 3 Encounters:  02/22/24 138/84  08/18/23 120/72  05/31/23 122/80   Stable, pt to continue medical treatment  - diet , wt control

## 2024-02-22 NOTE — Patient Instructions (Signed)

## 2024-02-22 NOTE — Assessment & Plan Note (Signed)
 Lab Results  Component Value Date   CREATININE 1.13 02/22/2024   Stable overall, cont to avoid nephrotoxins

## 2024-02-22 NOTE — Progress Notes (Signed)
 Patient ID: Joan Townsend, female   DOB: 1941-03-28, 83 y.o.   MRN: [id number]         Chief Complaint:: wellness exam and low thyroid , hld, htn, ckd3a       HPI:  Joan Townsend is a 83 y.o. female here for wellness exam; declines shignrix and pneumovax; o/w up to date                        Also Pt denies chest pain, increased sob or doe, wheezing, orthopnea, PND, increased LE swelling, palpitations, dizziness or syncope.   Pt denies polydipsia, polyuria, or new focal neuro s/s.    Pt denies fever, wt loss, night sweats, loss of appetite, or other constitutional symptoms  Denies hyper or hypo thyroid  symptoms such as voice, skin or hair change.  Does not want to take statin.     Wt Readings from Last 3 Encounters:  02/22/24 176 lb (79.8 kg)  08/18/23 176 lb (79.8 kg)  05/31/23 174 lb (78.9 kg)   BP Readings from Last 3 Encounters:  02/22/24 138/84  08/18/23 120/72  05/31/23 122/80   Immunization History  Administered Date(s) Administered   Tdap 08/23/2017   Health Maintenance Due  Topic Date Due   Medicare Annual Wellness (AWV)  Never done      Past Medical History:  Diagnosis Date   Acquired solitary kidney    s/p right nephrectomy   Angio-edema    Endometriosis    Hemorrhoids    Hepatitis    result of Scarlet fever   History of scarlet fever    Hyperlipidemia 06/29/2012   Hypothyroidism    HYPOTHYROIDISM 01/05/2011   Qualifier: Diagnosis of  By: Willy Harvest MD, Isidor Marek    Seasonal allergies    Urticaria    Past Surgical History:  Procedure Laterality Date   ABDOMINAL HYSTERECTOMY     APPENDECTOMY     CHOLECYSTECTOMY  2012   COLON RESECTION     part of endometriosis surgery   COLONOSCOPY  2006   hemorrhoids(Dr. Alethia Huxley)   HEMORRHOID BANDING  2014   NEPHRECTOMY     right, due to birth deformity    reports that she quit smoking about 45 years ago. Her smoking use included cigarettes. She has never used smokeless tobacco. She reports current alcohol use. She reports  that she does not use drugs. family history includes Breast cancer in an other family member; Colon cancer in an other family member; Heart disease in her maternal grandfather; Lung cancer in her sister; Ovarian cancer in an other family member; Uterine cancer in her mother. Allergies  Allergen Reactions   Ciprofloxacin Rash    REACTION: rash    Clindamycin/Lincomycin Anaphylaxis   Penicillins Rash    REACTION: rash    Sulfa Antibiotics     Hives   Sulfacetamide Sodium Hives    Hives   Cefaclor    Naproxen  Other (See Comments)    Facial swelling   Current Outpatient Medications on File Prior to Visit  Medication Sig Dispense Refill   estradiol  (ESTRACE ) 1 MG tablet Take 1 tablet (1 mg total) by mouth daily. 90 tablet 3   levothyroxine  (SYNTHROID ) 112 MCG tablet Take 1 tablet (112 mcg total) by mouth daily. 90 tablet 3   pantoprazole  (PROTONIX ) 40 MG tablet Take 1 tablet (40 mg total) by mouth daily. 90 tablet 3   No current facility-administered medications on file prior to visit.  ROS:  All others reviewed and negative.  Objective        PE:  BP 138/84 (BP Location: Left Arm, Patient Position: Sitting, Cuff Size: Normal)   Pulse 70   Temp 97.7 F (36.5 C) (Oral)   Ht 5\' 5"  (1.651 m)   Wt 176 lb (79.8 kg)   SpO2 98%   BMI 29.29 kg/m                 Constitutional: Pt appears in NAD               HENT: Head: NCAT.                Right Ear: External ear normal.                 Left Ear: External ear normal.                Eyes: . Pupils are equal, round, and reactive to light. Conjunctivae and EOM are normal               Nose: without d/c or deformity               Neck: Neck supple. Gross normal ROM               Cardiovascular: Normal rate and regular rhythm.                 Pulmonary/Chest: Effort normal and breath sounds without rales or wheezing.                Abd:  Soft, NT, ND, + BS, no organomegaly               Neurological: Pt is alert. At baseline  orientation, motor grossly intact               Skin: Skin is warm. No rashes, no other new lesions, LE edema - none               Psychiatric: Pt behavior is normal without agitation   Micro: none  Cardiac tracings I have personally interpreted today:  none  Pertinent Radiological findings (summarize): none   Lab Results  Component Value Date   WBC 7.6 02/22/2024   HGB 13.8 02/22/2024   HCT 41.9 02/22/2024   PLT 258.0 02/22/2024   GLUCOSE 99 02/22/2024   CHOL 230 (H) 02/22/2024   TRIG 96.0 02/22/2024   HDL 67.10 02/22/2024   LDLDIRECT 121.5 06/29/2012   LDLCALC 144 (H) 02/22/2024   ALT 10 02/22/2024   AST 15 02/22/2024   NA 140 02/22/2024   K 4.2 02/22/2024   CL 103 02/22/2024   CREATININE 1.13 02/22/2024   BUN 16 02/22/2024   CO2 30 02/22/2024   TSH 0.13 (L) 08/18/2023   INR 1.0 11/07/2018   HGBA1C 5.7 02/22/2024   Assessment/Plan:  Joan Townsend is a 83 y.o. White or Caucasian [1] female with  has a past medical history of Acquired solitary kidney, Angio-edema, Endometriosis, Hemorrhoids, Hepatitis, History of scarlet fever, Hyperlipidemia (06/29/2012), Hypothyroidism, HYPOTHYROIDISM (01/05/2011), Seasonal allergies, and Urticaria.  Encounter for well adult exam with abnormal findings Age and sex appropriate education and counseling updated with regular exercise and diet Referrals for preventative services - none needed Immunizations addressed - declines shingrix and prevnar Smoking counseling  - none needed Evidence for depression or other mood disorder - none significant Most recent labs reviewed. I have personally reviewed and have noted:  1) the patient's medical and social history 2) The patient's current medications and supplements 3) The patient's height, weight, and BMI have been recorded in the chart   Hypothyroidism Lab Results  Component Value Date   TSH 0.13 (L) 08/18/2023   overcontrolled, pt to continue levothyroxine  112 mcg every day, and f/u lab  today   Hyperlipidemia Lab Results  Component Value Date   LDLCALC 144 (H) 02/22/2024   uncontrolled, pt conts to decline statin, for lower chol diet   Essential hypertension BP Readings from Last 3 Encounters:  02/22/24 138/84  08/18/23 120/72  05/31/23 122/80   Stable, pt to continue medical treatment  - diet , wt control   CKD (chronic kidney disease) stage 3, GFR 30-59 ml/min (HCC) Lab Results  Component Value Date   CREATININE 1.13 02/22/2024   Stable overall, cont to avoid nephrotoxins  Followup: Return in about 1 year (around 02/21/2025).  Rosalia Colonel, MD 02/22/2024 8:00 PM [facility] Internal [facility]

## 2024-02-22 NOTE — Assessment & Plan Note (Signed)
 Lab Results  Component Value Date   LDLCALC 144 (H) 02/22/2024   uncontrolled, pt conts to decline statin, for lower chol diet

## 2024-02-22 NOTE — Assessment & Plan Note (Signed)
 Age and sex appropriate education and counseling updated with regular exercise and diet Referrals for preventative services - none needed Immunizations addressed - declines shingrix and prevnar Smoking counseling  - none needed Evidence for depression or other mood disorder - none significant Most recent labs reviewed. I have personally reviewed and have noted: 1) the patient's medical and social history 2) The patient's current medications and supplements 3) The patient's height, weight, and BMI have been recorded in the chart

## 2024-02-23 ENCOUNTER — Other Ambulatory Visit: Payer: Self-pay | Admitting: Internal Medicine

## 2024-02-23 MED ORDER — LEVOTHYROXINE SODIUM 100 MCG PO TABS: 100.0000 ug | ORAL_TABLET | Freq: Every day | ORAL | 3 refills | Status: AC

## 2024-02-23 MED ORDER — NITROFURANTOIN MACROCRYSTAL 50 MG PO CAPS: 50.0000 mg | ORAL_CAPSULE | Freq: Two times a day (BID) | ORAL | 0 refills | Status: AC

## 2024-02-23 NOTE — Telephone Encounter (Signed)
 Copied from CRM [id number]. Topic: Clinical - Lab/Test Results >> Feb 23, 2024  9:02 AM Howard Macho wrote: Reason for CRM: patient would like to be called regarding her lab results and her having a possible kidney infection. CB [phone]

## 2024-02-27 DIAGNOSIS — R8271 Bacteriuria: Secondary | ICD-10-CM | POA: Diagnosis not present

## 2024-02-27 DIAGNOSIS — N3000 Acute cystitis without hematuria: Secondary | ICD-10-CM | POA: Diagnosis not present

## 2024-03-14 DIAGNOSIS — N3000 Acute cystitis without hematuria: Secondary | ICD-10-CM | POA: Diagnosis not present

## 2024-04-11 ENCOUNTER — Emergency Department (HOSPITAL_COMMUNITY)

## 2024-04-11 ENCOUNTER — Inpatient Hospital Stay (HOSPITAL_COMMUNITY)
Admission: EM | Admit: 2024-04-11 | Discharge: 2024-04-19 | DRG: 470 | Disposition: A | Discharge status: Skilled Nursing Facility | Attending: Orthopedic Surgery | Admitting: Orthopedic Surgery

## 2024-04-11 ENCOUNTER — Encounter (HOSPITAL_COMMUNITY): Payer: Self-pay

## 2024-04-11 DIAGNOSIS — D62 Acute posthemorrhagic anemia: Secondary | ICD-10-CM | POA: Diagnosis not present

## 2024-04-11 DIAGNOSIS — Z79890 Hormone replacement therapy (postmenopausal): Secondary | ICD-10-CM | POA: Diagnosis not present

## 2024-04-11 DIAGNOSIS — E785 Hyperlipidemia, unspecified: Secondary | ICD-10-CM | POA: Diagnosis not present

## 2024-04-11 DIAGNOSIS — S0003XA Contusion of scalp, initial encounter: Secondary | ICD-10-CM | POA: Diagnosis not present

## 2024-04-11 DIAGNOSIS — N179 Acute kidney failure, unspecified: Secondary | ICD-10-CM | POA: Diagnosis not present

## 2024-04-11 DIAGNOSIS — S0101XA Laceration without foreign body of scalp, initial encounter: Secondary | ICD-10-CM

## 2024-04-11 DIAGNOSIS — S80919A Unspecified superficial injury of unspecified knee, initial encounter: Secondary | ICD-10-CM | POA: Diagnosis not present

## 2024-04-11 DIAGNOSIS — M549 Dorsalgia, unspecified: Secondary | ICD-10-CM | POA: Diagnosis present

## 2024-04-11 DIAGNOSIS — S199XXA Unspecified injury of neck, initial encounter: Secondary | ICD-10-CM | POA: Diagnosis not present

## 2024-04-11 DIAGNOSIS — E039 Hypothyroidism, unspecified: Secondary | ICD-10-CM | POA: Diagnosis not present

## 2024-04-11 DIAGNOSIS — S7291XA Unspecified fracture of right femur, initial encounter for closed fracture: Secondary | ICD-10-CM | POA: Diagnosis not present

## 2024-04-11 DIAGNOSIS — Z9049 Acquired absence of other specified parts of digestive tract: Secondary | ICD-10-CM | POA: Diagnosis not present

## 2024-04-11 DIAGNOSIS — S72401A Unspecified fracture of lower end of right femur, initial encounter for closed fracture: Secondary | ICD-10-CM | POA: Diagnosis not present

## 2024-04-11 DIAGNOSIS — M1711 Unilateral primary osteoarthritis, right knee: Secondary | ICD-10-CM | POA: Diagnosis not present

## 2024-04-11 DIAGNOSIS — Z880 Allergy status to penicillin: Secondary | ICD-10-CM

## 2024-04-11 DIAGNOSIS — Z881 Allergy status to other antibiotic agents status: Secondary | ICD-10-CM

## 2024-04-11 DIAGNOSIS — S72421A Displaced fracture of lateral condyle of right femur, initial encounter for closed fracture: Secondary | ICD-10-CM | POA: Diagnosis not present

## 2024-04-11 DIAGNOSIS — S2242XA Multiple fractures of ribs, left side, initial encounter for closed fracture: Secondary | ICD-10-CM | POA: Diagnosis not present

## 2024-04-11 DIAGNOSIS — Z8249 Family history of ischemic heart disease and other diseases of the circulatory system: Secondary | ICD-10-CM

## 2024-04-11 DIAGNOSIS — I129 Hypertensive chronic kidney disease with stage 1 through stage 4 chronic kidney disease, or unspecified chronic kidney disease: Secondary | ICD-10-CM | POA: Diagnosis not present

## 2024-04-11 DIAGNOSIS — R609 Edema, unspecified: Secondary | ICD-10-CM | POA: Diagnosis not present

## 2024-04-11 DIAGNOSIS — Z90710 Acquired absence of both cervix and uterus: Secondary | ICD-10-CM | POA: Diagnosis not present

## 2024-04-11 DIAGNOSIS — M25561 Pain in right knee: Secondary | ICD-10-CM | POA: Diagnosis not present

## 2024-04-11 DIAGNOSIS — Z96651 Presence of right artificial knee joint: Secondary | ICD-10-CM | POA: Diagnosis not present

## 2024-04-11 DIAGNOSIS — Z471 Aftercare following joint replacement surgery: Secondary | ICD-10-CM | POA: Diagnosis not present

## 2024-04-11 DIAGNOSIS — S72431A Displaced fracture of medial condyle of right femur, initial encounter for closed fracture: Secondary | ICD-10-CM | POA: Diagnosis not present

## 2024-04-11 DIAGNOSIS — R600 Localized edema: Secondary | ICD-10-CM | POA: Diagnosis not present

## 2024-04-11 DIAGNOSIS — Z8049 Family history of malignant neoplasm of other genital organs: Secondary | ICD-10-CM

## 2024-04-11 DIAGNOSIS — Z905 Acquired absence of kidney: Secondary | ICD-10-CM

## 2024-04-11 DIAGNOSIS — S0990XA Unspecified injury of head, initial encounter: Secondary | ICD-10-CM | POA: Diagnosis not present

## 2024-04-11 DIAGNOSIS — M6281 Muscle weakness (generalized): Secondary | ICD-10-CM | POA: Diagnosis not present

## 2024-04-11 DIAGNOSIS — Z79899 Other long term (current) drug therapy: Secondary | ICD-10-CM | POA: Diagnosis not present

## 2024-04-11 DIAGNOSIS — Z886 Allergy status to analgesic agent status: Secondary | ICD-10-CM

## 2024-04-11 DIAGNOSIS — S0181XA Laceration without foreign body of other part of head, initial encounter: Secondary | ICD-10-CM | POA: Diagnosis not present

## 2024-04-11 DIAGNOSIS — Z882 Allergy status to sulfonamides status: Secondary | ICD-10-CM

## 2024-04-11 DIAGNOSIS — Z23 Encounter for immunization: Secondary | ICD-10-CM | POA: Diagnosis not present

## 2024-04-11 DIAGNOSIS — Z7401 Bed confinement status: Secondary | ICD-10-CM | POA: Diagnosis not present

## 2024-04-11 DIAGNOSIS — D649 Anemia, unspecified: Secondary | ICD-10-CM | POA: Diagnosis not present

## 2024-04-11 DIAGNOSIS — Z8041 Family history of malignant neoplasm of ovary: Secondary | ICD-10-CM | POA: Diagnosis not present

## 2024-04-11 DIAGNOSIS — S3993XA Unspecified injury of pelvis, initial encounter: Secondary | ICD-10-CM | POA: Diagnosis not present

## 2024-04-11 DIAGNOSIS — S0181XD Laceration without foreign body of other part of head, subsequent encounter: Secondary | ICD-10-CM | POA: Diagnosis not present

## 2024-04-11 DIAGNOSIS — S72302D Unspecified fracture of shaft of left femur, subsequent encounter for closed fracture with routine healing: Secondary | ICD-10-CM | POA: Diagnosis not present

## 2024-04-11 DIAGNOSIS — Y92410 Unspecified street and highway as the place of occurrence of the external cause: Secondary | ICD-10-CM

## 2024-04-11 DIAGNOSIS — K5901 Slow transit constipation: Secondary | ICD-10-CM | POA: Diagnosis not present

## 2024-04-11 DIAGNOSIS — Q600 Renal agenesis, unilateral: Secondary | ICD-10-CM

## 2024-04-11 DIAGNOSIS — S79101A Unspecified physeal fracture of lower end of right femur, initial encounter for closed fracture: Secondary | ICD-10-CM | POA: Diagnosis not present

## 2024-04-11 DIAGNOSIS — Z87898 Personal history of other specified conditions: Secondary | ICD-10-CM | POA: Diagnosis not present

## 2024-04-11 DIAGNOSIS — N183 Chronic kidney disease, stage 3 unspecified: Secondary | ICD-10-CM | POA: Diagnosis not present

## 2024-04-11 DIAGNOSIS — Z87891 Personal history of nicotine dependence: Secondary | ICD-10-CM | POA: Diagnosis not present

## 2024-04-11 DIAGNOSIS — I10 Essential (primary) hypertension: Secondary | ICD-10-CM | POA: Diagnosis not present

## 2024-04-11 DIAGNOSIS — Z800 Family history of malignant neoplasm of digestive organs: Secondary | ICD-10-CM

## 2024-04-11 DIAGNOSIS — R2689 Other abnormalities of gait and mobility: Secondary | ICD-10-CM | POA: Diagnosis not present

## 2024-04-11 DIAGNOSIS — S82001A Unspecified fracture of right patella, initial encounter for closed fracture: Secondary | ICD-10-CM | POA: Diagnosis not present

## 2024-04-11 DIAGNOSIS — S728X1A Other fracture of right femur, initial encounter for closed fracture: Secondary | ICD-10-CM | POA: Diagnosis not present

## 2024-04-11 DIAGNOSIS — Z9189 Other specified personal risk factors, not elsewhere classified: Secondary | ICD-10-CM | POA: Diagnosis not present

## 2024-04-11 DIAGNOSIS — S2232XA Fracture of one rib, left side, initial encounter for closed fracture: Secondary | ICD-10-CM | POA: Diagnosis present

## 2024-04-11 DIAGNOSIS — S299XXA Unspecified injury of thorax, initial encounter: Secondary | ICD-10-CM | POA: Diagnosis not present

## 2024-04-11 DIAGNOSIS — S72351A Displaced comminuted fracture of shaft of right femur, initial encounter for closed fracture: Secondary | ICD-10-CM | POA: Diagnosis not present

## 2024-04-11 DIAGNOSIS — I700 Atherosclerosis of aorta: Secondary | ICD-10-CM | POA: Diagnosis not present

## 2024-04-11 DIAGNOSIS — I959 Hypotension, unspecified: Secondary | ICD-10-CM | POA: Diagnosis not present

## 2024-04-11 DIAGNOSIS — G8918 Other acute postprocedural pain: Secondary | ICD-10-CM | POA: Diagnosis not present

## 2024-04-11 DIAGNOSIS — S72491A Other fracture of lower end of right femur, initial encounter for closed fracture: Secondary | ICD-10-CM | POA: Diagnosis not present

## 2024-04-11 DIAGNOSIS — K5730 Diverticulosis of large intestine without perforation or abscess without bleeding: Secondary | ICD-10-CM | POA: Diagnosis not present

## 2024-04-11 LAB — I-STAT CHEM 8, ED
BUN: 19 mg/dL (ref 8–23)
Calcium, Ion: 1.17 mmol/L (ref 1.15–1.40)
Chloride: 101 mmol/L (ref 98–111)
Creatinine, Ser: 1.2 mg/dL — ABNORMAL HIGH (ref 0.44–1.00)
Glucose, Bld: 128 mg/dL — ABNORMAL HIGH (ref 70–99)
HCT: 42 % (ref 36.0–46.0)
Hemoglobin: 14.3 g/dL (ref 12.0–15.0)
Potassium: 3.8 mmol/L (ref 3.5–5.1)
Sodium: 140 mmol/L (ref 135–145)
TCO2: 27 mmol/L (ref 22–32)

## 2024-04-11 LAB — CBC
HCT: 42.2 % (ref 36.0–46.0)
Hemoglobin: 13.4 g/dL (ref 12.0–15.0)
MCH: 29.3 pg (ref 26.0–34.0)
MCHC: 31.8 g/dL (ref 30.0–36.0)
MCV: 92.3 fL (ref 80.0–100.0)
Platelets: 268 10*3/uL (ref 150–400)
RBC: 4.57 MIL/uL (ref 3.87–5.11)
RDW: 13.6 % (ref 11.5–15.5)
WBC: 10.1 10*3/uL (ref 4.0–10.5)
nRBC: 0 % (ref 0.0–0.2)

## 2024-04-11 LAB — COMPREHENSIVE METABOLIC PANEL WITH GFR
ALT: 12 U/L (ref 0–44)
AST: 22 U/L (ref 15–41)
Albumin: 3.4 g/dL — ABNORMAL LOW (ref 3.5–5.0)
Alkaline Phosphatase: 70 U/L (ref 38–126)
Anion gap: 10 (ref 5–15)
BUN: 15 mg/dL (ref 8–23)
CO2: 27 mmol/L (ref 22–32)
Calcium: 9.5 mg/dL (ref 8.9–10.3)
Chloride: 102 mmol/L (ref 98–111)
Creatinine, Ser: 1.17 mg/dL — ABNORMAL HIGH (ref 0.44–1.00)
GFR, Estimated: 46 mL/min — ABNORMAL LOW (ref >60)
Glucose, Bld: 129 mg/dL — ABNORMAL HIGH (ref 70–99)
Potassium: 3.6 mmol/L (ref 3.5–5.1)
Sodium: 139 mmol/L (ref 135–145)
Total Bilirubin: 0.5 mg/dL (ref 0.0–1.2)
Total Protein: 6.5 g/dL (ref 6.5–8.1)

## 2024-04-11 LAB — PROTIME-INR
INR: 1 (ref 0.8–1.2)
Prothrombin Time: 13.2 s (ref 11.4–15.2)

## 2024-04-11 LAB — I-STAT CG4 LACTIC ACID, ED: Lactic Acid, Venous: 2.4 mmol/L (ref 0.5–1.9)

## 2024-04-11 LAB — SAMPLE TO BLOOD BANK

## 2024-04-11 LAB — ETHANOL: Alcohol, Ethyl (B): <15 mg/dL (ref <15)

## 2024-04-11 MED ORDER — FENTANYL CITRATE PF 50 MCG/ML IJ SOSY
50.0000 ug | PREFILLED_SYRINGE | INTRAMUSCULAR | Status: DC | PRN
  Administered 2024-04-11: 50 ug via INTRAVENOUS
  Filled 2024-04-11: qty 1

## 2024-04-11 MED ORDER — TETANUS-DIPHTH-ACELL PERTUSSIS 5-2.5-18.5 LF-MCG/0.5 IM SUSY
0.5000 mL | PREFILLED_SYRINGE | Freq: Once | INTRAMUSCULAR | Status: AC
  Administered 2024-04-11: 0.5 mL via INTRAMUSCULAR
  Filled 2024-04-11: qty 0.5

## 2024-04-11 MED ORDER — TETANUS-DIPHTH-ACELL PERTUSSIS 5-2.5-18.5 LF-MCG/0.5 IM SUSY: 0.5000 mL | PREFILLED_SYRINGE | Freq: Once | INTRAMUSCULAR | Status: DC

## 2024-04-11 MED ORDER — LIDOCAINE-EPINEPHRINE (PF) 2 %-1:200000 IJ SOLN
20.0000 mL | Freq: Once | INTRAMUSCULAR | Status: AC
  Administered 2024-04-11: 20 mL
  Filled 2024-04-11: qty 20

## 2024-04-11 MED ORDER — FENTANYL CITRATE PF 50 MCG/ML IJ SOSY
50.0000 ug | PREFILLED_SYRINGE | Freq: Once | INTRAMUSCULAR | Status: AC
  Administered 2024-04-11: 50 ug via INTRAVENOUS
  Filled 2024-04-11: qty 1

## 2024-04-11 MED ORDER — IOHEXOL 350 MG/ML SOLN
75.0000 mL | Freq: Once | INTRAVENOUS | Status: AC | PRN
  Administered 2024-04-11: 75 mL via INTRAVENOUS
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
## 2024-04-11 NOTE — Progress Notes (Signed)
 Orthopedic Tech Progress Note Patient Details:  Joan Townsend [birth year] [id number]  Ortho Devices Type of Ortho Device: Knee Immobilizer Ortho Device/Splint Location: RLE Ortho Device/Splint Interventions: Ordered, Application, Adjustment   Post Interventions Patient Tolerated: Well Instructions Provided: Care of device R knee immobilizer applied    Jovanka Westgate L Yuridiana Formanek 04/11/2024, 11:24 PM

## 2024-04-11 NOTE — ED Notes (Signed)
 Transferred to hospital bed with Camilo Cella, RN. Comfort measures provided.
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
## 2024-04-11 NOTE — Progress Notes (Signed)
 Orthopedic Tech Progress Note Patient Details:  Joan Townsend 01/23/1941 [id number]  Patient ID: Joan Townsend, female   DOB: [birth year], 83 y.o.   MRN: [id number] During shift changed I was informed of a fx of the patella. I will provide an knee immobilizer when the providers are ready.   Jabarie Pop L Joan Townsend 04/11/2024, 9:09 PM
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
## 2024-04-11 NOTE — Progress Notes (Signed)
 Consult request received for right distal femur fracture. Have discussed with the requesting MD patient's stability, pain control, and ongoing work up. I have reviewed x-rays and developed a provisional plan.   CT scan now. NPO p MN. Full consultation to follow in the am. Plan for operative treatment tomorrow.  Hardy Lia, MD [facility], [facility] [phone]
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
## 2024-04-11 NOTE — ED Notes (Addendum)
 Rainbow sent down to main lab @1940 

## 2024-04-12 ENCOUNTER — Other Ambulatory Visit: Payer: Self-pay

## 2024-04-12 ENCOUNTER — Inpatient Hospital Stay (HOSPITAL_COMMUNITY)

## 2024-04-12 ENCOUNTER — Encounter (HOSPITAL_COMMUNITY)
Admission: EM | Disposition: A | Discharge status: Skilled Nursing Facility | Payer: Self-pay | Source: Home / Self Care | Attending: Orthopedic Surgery

## 2024-04-12 ENCOUNTER — Encounter (HOSPITAL_COMMUNITY): Payer: Self-pay

## 2024-04-12 ENCOUNTER — Encounter (HOSPITAL_COMMUNITY): Payer: Self-pay | Admitting: Anesthesiology

## 2024-04-12 LAB — CBC
HCT: 33 % — ABNORMAL LOW (ref 36.0–46.0)
Hemoglobin: 10.6 g/dL — ABNORMAL LOW (ref 12.0–15.0)
MCH: 29.1 pg (ref 26.0–34.0)
MCHC: 32.1 g/dL (ref 30.0–36.0)
MCV: 90.7 fL (ref 80.0–100.0)
Platelets: 242 10*3/uL (ref 150–400)
RBC: 3.64 MIL/uL — ABNORMAL LOW (ref 3.87–5.11)
RDW: 13.8 % (ref 11.5–15.5)
WBC: 10.1 10*3/uL (ref 4.0–10.5)
nRBC: 0 % (ref 0.0–0.2)

## 2024-04-12 LAB — BASIC METABOLIC PANEL WITH GFR
Anion gap: 11 (ref 5–15)
BUN: 18 mg/dL (ref 8–23)
CO2: 24 mmol/L (ref 22–32)
Calcium: 8.6 mg/dL — ABNORMAL LOW (ref 8.9–10.3)
Chloride: 102 mmol/L (ref 98–111)
Creatinine, Ser: 1.36 mg/dL — ABNORMAL HIGH (ref 0.44–1.00)
GFR, Estimated: 38 mL/min — ABNORMAL LOW (ref >60)
Glucose, Bld: 160 mg/dL — ABNORMAL HIGH (ref 70–99)
Potassium: 4.7 mmol/L (ref 3.5–5.1)
Sodium: 137 mmol/L (ref 135–145)

## 2024-04-12 LAB — ABO/RH: ABO/RH(D): O POS

## 2024-04-12 LAB — TYPE AND SCREEN
ABO/RH(D): O POS
Antibody Screen: NEGATIVE

## 2024-04-12 SURGERY — OPEN REDUCTION INTERNAL FIXATION (ORIF) DISTAL FEMUR FRACTURE
Anesthesia: General | Laterality: Right

## 2024-04-12 MED ORDER — MELATONIN 3 MG PO TABS
3.0000 mg | ORAL_TABLET | Freq: Every evening | ORAL | Status: DC | PRN
  Filled 2024-04-12 (×2): qty 1

## 2024-04-12 MED ORDER — METHOCARBAMOL 500 MG PO TABS
500.0000 mg | ORAL_TABLET | Freq: Once | ORAL | Status: AC
  Administered 2024-04-15: 500 mg via ORAL
  Filled 2024-04-12: qty 1

## 2024-04-12 MED ORDER — CHLORHEXIDINE GLUCONATE 0.12 % MT SOLN: 15.0000 mL | Freq: Once | OROMUCOSAL | Status: AC

## 2024-04-12 MED ORDER — ONDANSETRON HCL 4 MG/2ML IJ SOLN: 4.0000 mg | Freq: Four times a day (QID) | INTRAMUSCULAR | Status: DC | PRN

## 2024-04-12 MED ORDER — LACTATED RINGERS IV SOLN: INTRAVENOUS | Status: AC

## 2024-04-12 MED ORDER — ORAL CARE MOUTH RINSE
15.0000 mL | Freq: Once | OROMUCOSAL | Status: AC
Start: 2024-04-12 — End: 2024-04-12

## 2024-04-12 MED ORDER — OXYCODONE HCL 5 MG PO TABS: 5.0000 mg | ORAL_TABLET | Freq: Once | ORAL | Status: AC

## 2024-04-12 MED ORDER — METHOCARBAMOL 500 MG PO TABS
500.0000 mg | ORAL_TABLET | Freq: Three times a day (TID) | ORAL | Status: AC
  Administered 2024-04-13 – 2024-04-14 (×5): 500 mg via ORAL
  Filled 2024-04-12 (×8): qty 1

## 2024-04-12 MED ORDER — PANTOPRAZOLE SODIUM 40 MG PO TBEC
40.0000 mg | DELAYED_RELEASE_TABLET | Freq: Every day | ORAL | Status: DC
  Administered 2024-04-13 – 2024-04-19 (×6): 40 mg via ORAL
  Filled 2024-04-12 (×6): qty 1

## 2024-04-12 MED ORDER — OXYCODONE HCL 5 MG PO TABS
5.0000 mg | ORAL_TABLET | Freq: Four times a day (QID) | ORAL | Status: DC | PRN
  Administered 2024-04-13: 5 mg via ORAL
  Administered 2024-04-13 – 2024-04-14 (×3): 10 mg via ORAL
  Administered 2024-04-14: 5 mg via ORAL
  Administered 2024-04-14: 10 mg via ORAL
  Administered 2024-04-15: 5 mg via ORAL
  Administered 2024-04-15 (×2): 10 mg via ORAL
  Filled 2024-04-12 (×2): qty 2
  Filled 2024-04-12: qty 1
  Filled 2024-04-12 (×7): qty 2
  Filled 2024-04-12 (×2): qty 1

## 2024-04-12 MED ORDER — CHLORHEXIDINE GLUCONATE 0.12 % MT SOLN
OROMUCOSAL | Status: AC
  Filled 2024-04-12: qty 15

## 2024-04-12 MED ORDER — LEVOTHYROXINE SODIUM 100 MCG PO TABS
100.0000 ug | ORAL_TABLET | Freq: Every day | ORAL | Status: DC
  Administered 2024-04-13 – 2024-04-19 (×6): 100 ug via ORAL
  Filled 2024-04-12 (×7): qty 1

## 2024-04-12 MED ORDER — HYDROMORPHONE HCL 1 MG/ML IJ SOLN
0.5000 mg | INTRAMUSCULAR | Status: DC | PRN
  Administered 2024-04-14 – 2024-04-18 (×7): 0.5 mg via INTRAVENOUS
  Filled 2024-04-12 (×7): qty 0.5
  Filled 2024-04-12: qty 1
  Filled 2024-04-12: qty 0.5
  Filled 2024-04-12: qty 1

## 2024-04-12 MED ORDER — ACETAMINOPHEN 500 MG PO TABS
1000.0000 mg | ORAL_TABLET | Freq: Four times a day (QID) | ORAL | Status: DC
Start: 2024-04-12 — End: 2024-04-19
  Administered 2024-04-13 – 2024-04-19 (×20): 1000 mg via ORAL
  Filled 2024-04-12 (×25): qty 2

## 2024-04-12 MED ORDER — FENTANYL CITRATE (PF) 250 MCG/5ML IJ SOLN
INTRAMUSCULAR | Status: AC
  Filled 2024-04-12: qty 5

## 2024-04-12 MED ORDER — DOCUSATE SODIUM 100 MG PO CAPS
100.0000 mg | ORAL_CAPSULE | Freq: Two times a day (BID) | ORAL | Status: DC
  Administered 2024-04-13 – 2024-04-16 (×7): 100 mg via ORAL
  Filled 2024-04-12 (×10): qty 1

## 2024-04-12 MED ORDER — HYDRALAZINE HCL 20 MG/ML IJ SOLN: 10.0000 mg | INTRAMUSCULAR | Status: DC | PRN

## 2024-04-12 MED ORDER — ENOXAPARIN SODIUM 30 MG/0.3ML IJ SOSY
30.0000 mg | PREFILLED_SYRINGE | Freq: Two times a day (BID) | INTRAMUSCULAR | Status: AC
Start: 2024-04-13 — End: ?
  Administered 2024-04-13 – 2024-04-15 (×5): 30 mg via SUBCUTANEOUS
  Filled 2024-04-12 (×5): qty 0.3

## 2024-04-12 MED ORDER — LACTATED RINGERS IV BOLUS: 500.0000 mL | Freq: Once | INTRAVENOUS | Status: AC

## 2024-04-12 MED ORDER — FENTANYL CITRATE (PF) 100 MCG/2ML IJ SOLN: 25.0000 ug | Freq: Once | INTRAMUSCULAR | Status: DC

## 2024-04-12 MED ORDER — METHOCARBAMOL 1000 MG/10ML IJ SOLN: 500.0000 mg | Freq: Once | INTRAMUSCULAR | Status: AC

## 2024-04-12 MED ORDER — METOPROLOL TARTRATE 5 MG/5ML IV SOLN: 5.0000 mg | Freq: Four times a day (QID) | INTRAVENOUS | Status: DC | PRN

## 2024-04-12 MED ORDER — LACTATED RINGERS IV SOLN: INTRAVENOUS | Status: DC

## 2024-04-12 MED ORDER — POLYETHYLENE GLYCOL 3350 17 G PO PACK
17.0000 g | PACK | Freq: Every day | ORAL | Status: AC | PRN
Start: 2024-04-12 — End: ?

## 2024-04-12 MED ORDER — ONDANSETRON 4 MG PO TBDP
4.0000 mg | ORAL_TABLET | Freq: Four times a day (QID) | ORAL | Status: DC | PRN
Start: 2024-04-12 — End: 2024-04-16

## 2024-04-12 MED ORDER — METHOCARBAMOL 1000 MG/10ML IJ SOLN
500.0000 mg | Freq: Three times a day (TID) | INTRAMUSCULAR | Status: AC
  Filled 2024-04-12: qty 10

## 2024-04-12 MED ORDER — FENTANYL CITRATE (PF) 100 MCG/2ML IJ SOLN
INTRAMUSCULAR | Status: AC
  Filled 2024-04-12: qty 2

## 2024-04-12 NOTE — Progress Notes (Signed)
 We are currently waiting on a bed for patient to go to at this time on 4NP, there are no beds available per board, I notified Sylvester Evert, [place] and ask if we could put in a second choice for this patient since she is waiting in short stay. Verbal order that 6E or 4NP would be fine. Bed request updated and patient placement was notified.

## 2024-04-12 NOTE — Progress Notes (Addendum)
 Orthopaedic Trauma Service   CT scan of right knee reviewed Patient with extensive baseline degenerative changes including subchondral cyst Fairly moderate comminution medial and lateral supracondylar regions. Given overall appearance of fracture pattern we feel that a distal femoral placement would be the best option for her to get back to her baseline function in an expedient manner  discussed with family and patient at length.  She had actually seen Dr. Edna about a year ago and there was a discussion about a partial knee replacement at that time but she elected not to pursue that.  They are in agreement that they would like to pursue a distal femoral replacement instead of attempted ORIF.  We feel that this is quite reasonable  The soonest that we can proceed with distal femoral placement will be Monday which Dr. Edna has agreed to  Pt does not use any assistive devices at baseline   Ok resume diet   Ice to R leg as needed  Continue with knee immobilizer  Will order air mattress overlay   Transfer to 5n, 6n or 4NP when bed available   Continue per TS  Maximize non-narcotic analgesia   Francis MICAEL Mt, PA-C [phone] (C) 04/12/2024, 12:48 PM  [facility] [address] [place] [address] [phone] (F)        Patient ID: Joan Townsend, female   DOB: 1941/03/20, 83 y.o.   MRN: [id number]

## 2024-04-12 NOTE — Progress Notes (Signed)
 Transition of Care [facility]) - CAGE-AID Screening   Patient Details  Name: Joan Townsend MRN: [id number] Date of Birth: 1941/06/04  Asa Bjork, RN Trauma Response Nurse Phone Number: [phone] 04/12/2024, 5:31 PM     CAGE-AID Screening:    Have You Ever Felt You Ought to Cut Down on Your Drinking or Drug Use?: No Have People Annoyed You By Critizing Your Drinking Or Drug Use?: No Have You Felt Bad Or Guilty About Your Drinking Or Drug Use?: No Have You Ever Had a Drink or Used Drugs First Thing In The Morning to Steady Your Nerves or to Get Rid of a Hangover?: No CAGE-AID Score: 0  Substance Abuse Education Offered: (S) No (no services necessary)
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
## 2024-04-13 NOTE — Plan of Care (Signed)
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
## 2024-04-13 NOTE — Progress Notes (Signed)
 Orthopedic Tech Progress Note Patient Details:  Joan Townsend 10-09-1941 [id number]  Ortho Devices Type of Ortho Device: Other (comment) Ortho Device/Splint Location: Bulky Jones foot to thigh reapply KI Ortho Device/Splint Interventions: Ordered, Application   Post Interventions Patient Tolerated: Well Instructions Provided: Care of device  Herbie Loll 04/13/2024, 9:29 AM

## 2024-04-13 NOTE — Progress Notes (Signed)
 I spoke with Dr Pryor Browning about patients surgery scheduled at [facility] on Monday, patient will be transferred over the weekend with surgery being at [facility] on Monday.  Dr Pryor Browning said he would start working on the transfer process.   Thomasina Fletcher BSN, Radio producer - Perioperative Services [facility] [phone] or [phone]
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
## 2024-04-14 LAB — BASIC METABOLIC PANEL WITH GFR
Anion gap: 6 (ref 5–15)
BUN: 17 mg/dL (ref 8–23)
CO2: 27 mmol/L (ref 22–32)
Calcium: 8 mg/dL — ABNORMAL LOW (ref 8.9–10.3)
Chloride: 106 mmol/L (ref 98–111)
Creatinine, Ser: 1.25 mg/dL — ABNORMAL HIGH (ref 0.44–1.00)
GFR, Estimated: 42 mL/min — ABNORMAL LOW (ref >60)
Glucose, Bld: 102 mg/dL — ABNORMAL HIGH (ref 70–99)
Potassium: 4.5 mmol/L (ref 3.5–5.1)
Sodium: 139 mmol/L (ref 135–145)

## 2024-04-14 LAB — CBC
HCT: 27.4 % — ABNORMAL LOW (ref 36.0–46.0)
Hemoglobin: 8.7 g/dL — ABNORMAL LOW (ref 12.0–15.0)
MCH: 29.5 pg (ref 26.0–34.0)
MCHC: 31.8 g/dL (ref 30.0–36.0)
MCV: 92.9 fL (ref 80.0–100.0)
Platelets: 189 10*3/uL (ref 150–400)
RBC: 2.95 MIL/uL — ABNORMAL LOW (ref 3.87–5.11)
RDW: 13.9 % (ref 11.5–15.5)
WBC: 10.6 10*3/uL — ABNORMAL HIGH (ref 4.0–10.5)
nRBC: 0 % (ref 0.0–0.2)

## 2024-04-14 MED ORDER — POVIDONE-IODINE 10 % EX SWAB: 2.0000 | CUTANEOUS | Status: DC

## 2024-04-14 MED ORDER — GENTAMICIN SULFATE 40 MG/ML IJ SOLN
5.0000 mg/kg | INTRAVENOUS | Status: AC
  Administered 2024-04-16: 331.2 mg via INTRAVENOUS
  Filled 2024-04-14: qty 8.25

## 2024-04-14 MED ORDER — CHLORHEXIDINE GLUCONATE 4 % EX SOLN
60.0000 mL | Freq: Once | CUTANEOUS | Status: AC
  Administered 2024-04-16: 4 via TOPICAL

## 2024-04-14 MED ORDER — TRANEXAMIC ACID-NACL 1000-0.7 MG/100ML-% IV SOLN
1000.0000 mg | INTRAVENOUS | Status: AC
  Administered 2024-04-16: 1000 mg via INTRAVENOUS
  Filled 2024-04-14: qty 100

## 2024-04-14 MED ORDER — VANCOMYCIN HCL IN DEXTROSE 1-5 GM/200ML-% IV SOLN
1000.0000 mg | INTRAVENOUS | Status: AC
  Administered 2024-04-16: 1000 mg via INTRAVENOUS
  Filled 2024-04-14: qty 200
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
## 2024-04-14 NOTE — Progress Notes (Signed)
 RN attempted to give report to receiving nurse but was informed RN busy and unable to receive report. [facility] at bedside to transport pt to [facility]. Pt premedicated prior to transport. Pt transported off unit via stretcher. Pt daughter at bedside took all pt's belongings with her. Writer left message for receiving nurse Aleck to contact for report or with any questions. Pt off the unit with [facility] transport. P. Amo Mollie Rossano RN
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
## 2024-04-15 LAB — BASIC METABOLIC PANEL WITH GFR
Anion gap: 6 (ref 5–15)
BUN: 19 mg/dL (ref 8–23)
CO2: 27 mmol/L (ref 22–32)
Calcium: 8.3 mg/dL — ABNORMAL LOW (ref 8.9–10.3)
Chloride: 106 mmol/L (ref 98–111)
Creatinine, Ser: 1.25 mg/dL — ABNORMAL HIGH (ref 0.44–1.00)
GFR, Estimated: 42 mL/min — ABNORMAL LOW (ref >60)
Glucose, Bld: 108 mg/dL — ABNORMAL HIGH (ref 70–99)
Potassium: 4.7 mmol/L (ref 3.5–5.1)
Sodium: 139 mmol/L (ref 135–145)

## 2024-04-15 LAB — CBC
HCT: 27.3 % — ABNORMAL LOW (ref 36.0–46.0)
Hemoglobin: 8.3 g/dL — ABNORMAL LOW (ref 12.0–15.0)
MCH: 29.1 pg (ref 26.0–34.0)
MCHC: 30.4 g/dL (ref 30.0–36.0)
MCV: 95.8 fL (ref 80.0–100.0)
Platelets: 216 10*3/uL (ref 150–400)
RBC: 2.85 MIL/uL — ABNORMAL LOW (ref 3.87–5.11)
RDW: 13.8 % (ref 11.5–15.5)
WBC: 10.1 10*3/uL (ref 4.0–10.5)
nRBC: 0 % (ref 0.0–0.2)

## 2024-04-15 MED ORDER — ENOXAPARIN SODIUM 30 MG/0.3ML IJ SOSY: 30.0000 mg | PREFILLED_SYRINGE | Freq: Two times a day (BID) | INTRAMUSCULAR | Status: DC

## 2024-04-15 NOTE — Plan of Care (Signed)

## 2024-04-15 NOTE — Progress Notes (Signed)
 Orthopaedic Trauma Service (OTS)    Procedure(s) (LRB): OPEN REDUCTION INTERNAL FIXATION (ORIF) DISTAL FEMUR FRACTURE (Right)  Subjective: Patient reports pain as mild.    Objective: Current Vitals Blood pressure (!) 165/71, pulse 77, temperature 98.2 F (36.8 C), temperature source Oral, resp. rate 18, height (P) 5' 5 (1.651 m), weight (P) 66.2 kg, SpO2 97%. Vital signs in last 24 hours: Temp:  [97.6 F (36.4 C)-98.2 F (36.8 C)] 98.2 F (36.8 C) (06/22 1529) Pulse Rate:  [73-80] 77 (06/22 1529) Resp:  [16-18] 18 (06/22 1529) BP: (130-165)/(49-84) 165/71 (06/22 1529) SpO2:  [92 %-97 %] 97 % (06/22 1529)  Intake/Output from previous day: 06/21 0701 - 06/22 0700 In: 240 [P.O.:240] Out: 800 [Urine:800]  LABS Recent Labs    04/14/24 0840 04/15/24 0313  HGB 8.7* 8.3*   Recent Labs    04/14/24 0840 04/15/24 0313  WBC 10.6* 10.1  RBC 2.95* 2.85*  HCT 27.4* 27.3*  PLT 189 216   Recent Labs    04/14/24 0840 04/15/24 0313  NA 139 139  K 4.5 4.7  CL 106 106  CO2 27 27  BUN 17 19  CREATININE 1.25* 1.25*  GLUCOSE 102* 108*  CALCIUM  8.0* 8.3*   No results for input(s): LABPT, INR in the last 72 hours.   Physical Exam RLE Splint intact, clean, dry  Edema/ swelling controlled  Sens: DPN, SPN, TN intact  Motor: EHL, FHL, and lessor toe ext and flex all intact grossly  Brisk cap refill, warm to touch  Assessment/Plan: DFR with Dr. Edna tomorrow. Preop orders in  Ozell Bruch, MD [facility], [facility] [phone]

## 2024-04-16 ENCOUNTER — Inpatient Hospital Stay (HOSPITAL_COMMUNITY): Payer: Self-pay | Admitting: Anesthesiology

## 2024-04-16 ENCOUNTER — Inpatient Hospital Stay (HOSPITAL_COMMUNITY)

## 2024-04-16 ENCOUNTER — Encounter (HOSPITAL_COMMUNITY): Payer: Self-pay | Admitting: Orthopedic Surgery

## 2024-04-16 ENCOUNTER — Other Ambulatory Visit: Payer: Self-pay

## 2024-04-16 ENCOUNTER — Encounter (HOSPITAL_COMMUNITY)
Admission: EM | Disposition: A | Discharge status: Skilled Nursing Facility | Payer: Self-pay | Source: Home / Self Care | Attending: Orthopedic Surgery

## 2024-04-16 DIAGNOSIS — E785 Hyperlipidemia, unspecified: Secondary | ICD-10-CM | POA: Diagnosis not present

## 2024-04-16 DIAGNOSIS — N183 Chronic kidney disease, stage 3 unspecified: Secondary | ICD-10-CM

## 2024-04-16 DIAGNOSIS — I129 Hypertensive chronic kidney disease with stage 1 through stage 4 chronic kidney disease, or unspecified chronic kidney disease: Secondary | ICD-10-CM | POA: Diagnosis not present

## 2024-04-16 DIAGNOSIS — M1711 Unilateral primary osteoarthritis, right knee: Secondary | ICD-10-CM | POA: Diagnosis not present

## 2024-04-16 HISTORY — PX: TOTAL KNEE ARTHROPLASTY: SHX125

## 2024-04-16 LAB — CBC
HCT: 26.4 % — ABNORMAL LOW (ref 36.0–46.0)
Hemoglobin: 8.2 g/dL — ABNORMAL LOW (ref 12.0–15.0)
MCH: 29.1 pg (ref 26.0–34.0)
MCHC: 31.1 g/dL (ref 30.0–36.0)
MCV: 93.6 fL (ref 80.0–100.0)
Platelets: 264 10*3/uL (ref 150–400)
RBC: 2.82 MIL/uL — ABNORMAL LOW (ref 3.87–5.11)
RDW: 13.8 % (ref 11.5–15.5)
WBC: 9 10*3/uL (ref 4.0–10.5)
nRBC: 0 % (ref 0.0–0.2)

## 2024-04-16 LAB — BASIC METABOLIC PANEL WITH GFR
Anion gap: 8 (ref 5–15)
BUN: 18 mg/dL (ref 8–23)
CO2: 27 mmol/L (ref 22–32)
Calcium: 8.3 mg/dL — ABNORMAL LOW (ref 8.9–10.3)
Chloride: 104 mmol/L (ref 98–111)
Creatinine, Ser: 1.06 mg/dL — ABNORMAL HIGH (ref 0.44–1.00)
GFR, Estimated: 51 mL/min — ABNORMAL LOW (ref >60)
Glucose, Bld: 133 mg/dL — ABNORMAL HIGH (ref 70–99)
Potassium: 4.1 mmol/L (ref 3.5–5.1)
Sodium: 139 mmol/L (ref 135–145)

## 2024-04-16 LAB — PREPARE RBC (CROSSMATCH)

## 2024-04-16 LAB — SURGICAL PCR SCREEN
MRSA, PCR: NEGATIVE
Staphylococcus aureus: NEGATIVE

## 2024-04-16 SURGERY — ARTHROPLASTY, KNEE, TOTAL
Anesthesia: General | Site: Knee | Laterality: Right

## 2024-04-16 MED ORDER — FENTANYL CITRATE (PF) 250 MCG/5ML IJ SOLN
INTRAMUSCULAR | Status: AC
  Filled 2024-04-16: qty 5

## 2024-04-16 MED ORDER — HYDROMORPHONE HCL 1 MG/ML IJ SOLN
INTRAMUSCULAR | Status: DC | PRN
  Administered 2024-04-16: .4 mg via INTRAVENOUS
  Administered 2024-04-16: .2 mg via INTRAVENOUS
  Administered 2024-04-16: .4 mg via INTRAVENOUS

## 2024-04-16 MED ORDER — BUPIVACAINE-EPINEPHRINE 0.25% -1:200000 IJ SOLN
INTRAMUSCULAR | Status: DC | PRN
  Administered 2024-04-16: 30 mL

## 2024-04-16 MED ORDER — FENTANYL CITRATE PF 50 MCG/ML IJ SOSY
PREFILLED_SYRINGE | INTRAMUSCULAR | Status: AC
  Filled 2024-04-16: qty 2

## 2024-04-16 MED ORDER — DEXMEDETOMIDINE HCL IN NACL 80 MCG/20ML IV SOLN
INTRAVENOUS | Status: AC
  Filled 2024-04-16: qty 20

## 2024-04-16 MED ORDER — FENTANYL CITRATE PF 50 MCG/ML IJ SOSY
100.0000 ug | PREFILLED_SYRINGE | INTRAMUSCULAR | Status: DC
  Administered 2024-04-16: 50 ug via INTRAVENOUS
  Filled 2024-04-16: qty 2

## 2024-04-16 MED ORDER — VANCOMYCIN HCL IN DEXTROSE 1-5 GM/200ML-% IV SOLN: 1000.0000 mg | Freq: Two times a day (BID) | INTRAVENOUS | Status: DC

## 2024-04-16 MED ORDER — PROPOFOL 10 MG/ML IV BOLUS
INTRAVENOUS | Status: AC
Start: 2024-04-16 — End: 2024-04-16
  Filled 2024-04-16: qty 20

## 2024-04-16 MED ORDER — ACETAMINOPHEN 325 MG PO TABS: 325.0000 mg | ORAL_TABLET | Freq: Four times a day (QID) | ORAL | Status: DC | PRN

## 2024-04-16 MED ORDER — FENTANYL CITRATE PF 50 MCG/ML IJ SOSY
25.0000 ug | PREFILLED_SYRINGE | INTRAMUSCULAR | Status: DC | PRN
  Administered 2024-04-16 (×2): 50 ug via INTRAVENOUS

## 2024-04-16 MED ORDER — SODIUM CHLORIDE 0.9 % IR SOLN
Status: DC | PRN
  Administered 2024-04-16: 3000 mL

## 2024-04-16 MED ORDER — SODIUM CHLORIDE (PF) 0.9 % IJ SOLN
INTRAMUSCULAR | Status: AC
Start: 2024-04-16 — End: 2024-04-16
  Filled 2024-04-16: qty 10

## 2024-04-16 MED ORDER — DIPHENHYDRAMINE HCL 12.5 MG/5ML PO ELIX: 12.5000 mg | ORAL_SOLUTION | ORAL | Status: DC | PRN

## 2024-04-16 MED ORDER — SODIUM CHLORIDE 0.9 % IV SOLN
INTRAVENOUS | Status: DC | PRN
Start: 2024-04-16 — End: 2024-04-16

## 2024-04-16 MED ORDER — VANCOMYCIN HCL IN DEXTROSE 1-5 GM/200ML-% IV SOLN
INTRAVENOUS | Status: AC
  Filled 2024-04-16: qty 200

## 2024-04-16 MED ORDER — ROPIVACAINE HCL 5 MG/ML IJ SOLN
INTRAMUSCULAR | Status: DC | PRN
  Administered 2024-04-16: 20 mL via PERINEURAL

## 2024-04-16 MED ORDER — ROCURONIUM BROMIDE 100 MG/10ML IV SOLN
INTRAVENOUS | Status: DC | PRN
  Administered 2024-04-16: 50 mg via INTRAVENOUS

## 2024-04-16 MED ORDER — DOCUSATE SODIUM 100 MG PO CAPS
100.0000 mg | ORAL_CAPSULE | Freq: Two times a day (BID) | ORAL | Status: DC
  Administered 2024-04-17 – 2024-04-19 (×5): 100 mg via ORAL
  Filled 2024-04-16 (×5): qty 1

## 2024-04-16 MED ORDER — DEXAMETHASONE SODIUM PHOSPHATE 10 MG/ML IJ SOLN
INTRAMUSCULAR | Status: DC | PRN
  Administered 2024-04-16: 8 mg via INTRAVENOUS

## 2024-04-16 MED ORDER — ROCURONIUM BROMIDE 10 MG/ML (PF) SYRINGE
PREFILLED_SYRINGE | INTRAVENOUS | Status: AC
Start: 2024-04-16 — End: 2024-04-16
  Filled 2024-04-16: qty 10

## 2024-04-16 MED ORDER — APIXABAN 2.5 MG PO TABS
2.5000 mg | ORAL_TABLET | Freq: Two times a day (BID) | ORAL | Status: DC
  Administered 2024-04-17 – 2024-04-19 (×4): 2.5 mg via ORAL
  Filled 2024-04-16 (×5): qty 1

## 2024-04-16 MED ORDER — VANCOMYCIN HCL 750 MG/150ML IV SOLN
750.0000 mg | INTRAVENOUS | Status: AC
  Administered 2024-04-17 – 2024-04-19 (×3): 750 mg via INTRAVENOUS
  Filled 2024-04-16 (×3): qty 150

## 2024-04-16 MED ORDER — SODIUM CHLORIDE 0.9% IV SOLUTION: Freq: Once | INTRAVENOUS | Status: DC

## 2024-04-16 MED ORDER — ONDANSETRON HCL 4 MG/2ML IJ SOLN: 4.0000 mg | Freq: Four times a day (QID) | INTRAMUSCULAR | Status: DC | PRN

## 2024-04-16 MED ORDER — PHENOL 1.4 % MT LIQD: 1.0000 | OROMUCOSAL | Status: DC | PRN

## 2024-04-16 MED ORDER — HYDROMORPHONE HCL 2 MG/ML IJ SOLN
INTRAMUSCULAR | Status: AC
  Filled 2024-04-16: qty 1

## 2024-04-16 MED ORDER — MIDAZOLAM HCL 2 MG/2ML IJ SOLN: 2.0000 mg | INTRAMUSCULAR | Status: DC

## 2024-04-16 MED ORDER — FENTANYL CITRATE (PF) 100 MCG/2ML IJ SOLN
INTRAMUSCULAR | Status: DC | PRN
  Administered 2024-04-16 (×2): 50 ug via INTRAVENOUS

## 2024-04-16 MED ORDER — LIDOCAINE HCL (PF) 2 % IJ SOLN
INTRAMUSCULAR | Status: AC
Start: 2024-04-16 — End: 2024-04-16
  Filled 2024-04-16: qty 5

## 2024-04-16 MED ORDER — DEXMEDETOMIDINE HCL IN NACL 80 MCG/20ML IV SOLN
INTRAVENOUS | Status: DC | PRN
  Administered 2024-04-16: 8 ug via INTRAVENOUS
  Administered 2024-04-16: 4 ug via INTRAVENOUS

## 2024-04-16 MED ORDER — LACTATED RINGERS IV SOLN: INTRAVENOUS | Status: DC

## 2024-04-16 MED ORDER — ALBUMIN HUMAN 5 % IV SOLN
INTRAVENOUS | Status: AC
Start: 2024-04-16 — End: 2024-04-16
  Filled 2024-04-16: qty 250

## 2024-04-16 MED ORDER — LACTATED RINGERS IV SOLN: INTRAVENOUS | Status: DC | PRN

## 2024-04-16 MED ORDER — ZOLPIDEM TARTRATE 5 MG PO TABS: 5.0000 mg | ORAL_TABLET | Freq: Every evening | ORAL | Status: DC | PRN

## 2024-04-16 MED ORDER — PROPOFOL 1000 MG/100ML IV EMUL
INTRAVENOUS | Status: AC
  Filled 2024-04-16: qty 100

## 2024-04-16 MED ORDER — DEXAMETHASONE SODIUM PHOSPHATE 10 MG/ML IJ SOLN
INTRAMUSCULAR | Status: AC
Start: 2024-04-16 — End: 2024-04-16
  Filled 2024-04-16: qty 1

## 2024-04-16 MED ORDER — OXYCODONE HCL 5 MG PO TABS: 5.0000 mg | ORAL_TABLET | Freq: Once | ORAL | Status: DC | PRN

## 2024-04-16 MED ORDER — GENTAMICIN SULFATE 40 MG/ML IJ SOLN
330.0000 mg | INTRAVENOUS | Status: DC
  Administered 2024-04-17 – 2024-04-18 (×2): 330 mg via INTRAVENOUS
  Filled 2024-04-16 (×3): qty 8.25

## 2024-04-16 MED ORDER — ENSURE PRE-SURGERY PO LIQD
296.0000 mL | Freq: Once | ORAL | Status: AC
  Administered 2024-04-16: 296 mL via ORAL
  Filled 2024-04-16: qty 296

## 2024-04-16 MED ORDER — DEXAMETHASONE SODIUM PHOSPHATE 10 MG/ML IJ SOLN: INTRAMUSCULAR | Status: DC | PRN

## 2024-04-16 MED ORDER — ISOPROPYL ALCOHOL 70 % SOLN
Status: DC | PRN
  Administered 2024-04-16: 1 via TOPICAL

## 2024-04-16 MED ORDER — KETOROLAC TROMETHAMINE 15 MG/ML IJ SOLN: 7.5000 mg | Freq: Four times a day (QID) | INTRAMUSCULAR | Status: DC

## 2024-04-16 MED ORDER — ONDANSETRON HCL 4 MG/2ML IJ SOLN
INTRAMUSCULAR | Status: AC
Start: 2024-04-16 — End: 2024-04-16
  Filled 2024-04-16: qty 2

## 2024-04-16 MED ORDER — BUPIVACAINE-EPINEPHRINE (PF) 0.25% -1:200000 IJ SOLN
INTRAMUSCULAR | Status: AC
  Filled 2024-04-16: qty 30

## 2024-04-16 MED ORDER — SUGAMMADEX SODIUM 200 MG/2ML IV SOLN
INTRAVENOUS | Status: DC | PRN
  Administered 2024-04-16: 200 mg via INTRAVENOUS

## 2024-04-16 MED ORDER — SODIUM CHLORIDE 0.9% FLUSH
INTRAVENOUS | Status: DC | PRN
  Administered 2024-04-16: 30 mL

## 2024-04-16 MED ORDER — ONDANSETRON HCL 4 MG PO TABS: 4.0000 mg | ORAL_TABLET | Freq: Four times a day (QID) | ORAL | Status: DC | PRN

## 2024-04-16 MED ORDER — ONDANSETRON HCL 4 MG/2ML IJ SOLN
INTRAMUSCULAR | Status: DC | PRN
  Administered 2024-04-16: 4 mg via INTRAVENOUS

## 2024-04-16 MED ORDER — MENTHOL 3 MG MT LOZG: 1.0000 | LOZENGE | OROMUCOSAL | Status: DC | PRN

## 2024-04-16 MED ORDER — METHOCARBAMOL 1000 MG/10ML IJ SOLN
INTRAMUSCULAR | Status: AC
  Filled 2024-04-16: qty 10

## 2024-04-16 MED ORDER — METHOCARBAMOL 1000 MG/10ML IJ SOLN
500.0000 mg | Freq: Four times a day (QID) | INTRAMUSCULAR | Status: DC | PRN
  Administered 2024-04-16: 500 mg via INTRAVENOUS

## 2024-04-16 MED ORDER — FENTANYL CITRATE (PF) 100 MCG/2ML IJ SOLN
INTRAMUSCULAR | Status: AC
  Filled 2024-04-16: qty 2

## 2024-04-16 MED ORDER — PHENYLEPHRINE HCL-NACL 20-0.9 MG/250ML-% IV SOLN
INTRAVENOUS | Status: DC | PRN
Start: 2024-04-16 — End: 2024-04-16
  Administered 2024-04-16: 50 ug/min via INTRAVENOUS

## 2024-04-16 MED ORDER — PROPOFOL 500 MG/50ML IV EMUL
INTRAVENOUS | Status: DC | PRN
Start: 2024-04-16 — End: 2024-04-16
  Administered 2024-04-16: 120 ug/kg/min via INTRAVENOUS

## 2024-04-16 MED ORDER — AMISULPRIDE (ANTIEMETIC) 5 MG/2ML IV SOLN: 10.0000 mg | Freq: Once | INTRAVENOUS | Status: DC | PRN

## 2024-04-16 MED ORDER — METHOCARBAMOL 500 MG PO TABS
500.0000 mg | ORAL_TABLET | Freq: Four times a day (QID) | ORAL | Status: DC | PRN
  Administered 2024-04-17 – 2024-04-19 (×6): 500 mg via ORAL
  Filled 2024-04-16 (×6): qty 1

## 2024-04-16 MED ORDER — OXYCODONE HCL 5 MG/5ML PO SOLN: 5.0000 mg | Freq: Once | ORAL | Status: DC | PRN

## 2024-04-16 MED ORDER — FENTANYL CITRATE (PF) 250 MCG/5ML IJ SOLN
INTRAMUSCULAR | Status: DC | PRN
  Administered 2024-04-16: 100 ug via INTRAVENOUS
  Administered 2024-04-16: 50 ug via INTRAVENOUS
  Administered 2024-04-16: 100 ug via INTRAVENOUS

## 2024-04-16 MED ORDER — OXYCODONE HCL 5 MG PO TABS
5.0000 mg | ORAL_TABLET | ORAL | Status: DC | PRN
  Administered 2024-04-16 – 2024-04-19 (×10): 10 mg via ORAL
  Filled 2024-04-16 (×10): qty 2

## 2024-04-16 MED ORDER — DOXYCYCLINE HYCLATE 100 MG PO TABS: 100.0000 mg | ORAL_TABLET | Freq: Two times a day (BID) | ORAL | Status: DC

## 2024-04-16 MED ORDER — LIDOCAINE HCL (CARDIAC) PF 100 MG/5ML IV SOSY
PREFILLED_SYRINGE | INTRAVENOUS | Status: DC | PRN
  Administered 2024-04-16: 80 mg via INTRAVENOUS

## 2024-04-16 MED ORDER — PHENYLEPHRINE 80 MCG/ML (10ML) SYRINGE FOR IV PUSH (FOR BLOOD PRESSURE SUPPORT)
PREFILLED_SYRINGE | INTRAVENOUS | Status: DC | PRN
  Administered 2024-04-16 (×2): 80 ug via INTRAVENOUS

## 2024-04-16 MED ORDER — 0.9 % SODIUM CHLORIDE (POUR BTL) OPTIME
TOPICAL | Status: DC | PRN
  Administered 2024-04-16: 1000 mL

## 2024-04-16 MED ORDER — BUPIVACAINE LIPOSOME 1.3 % IJ SUSP
INTRAMUSCULAR | Status: DC | PRN
  Administered 2024-04-16: 20 mL

## 2024-04-16 MED ORDER — BUPIVACAINE LIPOSOME 1.3 % IJ SUSP
INTRAMUSCULAR | Status: AC
  Filled 2024-04-16: qty 20

## 2024-04-16 MED ORDER — PROPOFOL 10 MG/ML IV BOLUS
INTRAVENOUS | Status: DC | PRN
  Administered 2024-04-16: 100 mg via INTRAVENOUS
  Administered 2024-04-16: 20 mg via INTRAVENOUS
  Administered 2024-04-16: 30 mg via INTRAVENOUS

## 2024-04-16 MED ORDER — WATER FOR IRRIGATION, STERILE IR SOLN
Status: DC | PRN
  Administered 2024-04-16: 2000 mL

## 2024-04-16 SURGICAL SUPPLY — 66 items
AXLE ORTHOPEDIC SALVAGE SYSTEM (Knees) IMPLANT
BAG COUNTER SPONGE SURGICOUNT (BAG) IMPLANT
BASEPLATE TIBIAL 71X160 HIP (Plate) IMPLANT
BEARING TIBIAL OSS ARCOM 12MM (Knees) IMPLANT
BLADE SAG 18X100X1.27 (BLADE) ×1 IMPLANT
BLADE SAW SAG 35X64 .89 (BLADE) ×1 IMPLANT
BLADE SAW SGTL 11.0X1.19X90.0M (BLADE) IMPLANT
BNDG COHESIVE 3X5 TAN ST LF (GAUZE/BANDAGES/DRESSINGS) ×1 IMPLANT
BNDG ELASTIC 6X10 VLCR STRL LF (GAUZE/BANDAGES/DRESSINGS) ×1 IMPLANT
BOWL SMART MIX CTS (DISPOSABLE) IMPLANT
BRUSH FEMORAL CANAL (MISCELLANEOUS) IMPLANT
BUSHING TIBIAL OSS ARCOM (Knees) IMPLANT
CABLE CERLAGE W/CRIMP 1.8 (Cable) IMPLANT
CEMENT BONE R 1X40 (Cement) IMPLANT
CEMENT BONE REFOBACIN R1X40 US (Cement) IMPLANT
CEMENT RESTRICTOR BONE PREP ST (Cement) IMPLANT
CHLORAPREP W/TINT 26 (MISCELLANEOUS) ×2 IMPLANT
COMP PATELLA NEXGEN 32 (Orthopedic Implant) IMPLANT
COMPONENT FEM OSS STD KN 8.5RT (Knees) IMPLANT
COVER SURGICAL LIGHT HANDLE (MISCELLANEOUS) ×1 IMPLANT
CUFF TRNQT CYL 34X4.125X (TOURNIQUET CUFF) ×1 IMPLANT
DERMABOND ADVANCED .7 DNX12 (GAUZE/BANDAGES/DRESSINGS) ×1 IMPLANT
DRAPE INCISE IOBAN 85X60 (DRAPES) ×1 IMPLANT
DRAPE SHEET LG 3/4 BI-LAMINATE (DRAPES) ×1 IMPLANT
DRAPE U-SHAPE 47X51 STRL (DRAPES) ×1 IMPLANT
DRSG AQUACEL AG ADV 3.5X10 (GAUZE/BANDAGES/DRESSINGS) ×1 IMPLANT
DRSG AQUACEL AG ADV 3.5X14 (GAUZE/BANDAGES/DRESSINGS) IMPLANT
DRSG TEGADERM 2-3/8X2-3/4 SM (GAUZE/BANDAGES/DRESSINGS) IMPLANT
DRSG XEROFORM 1X8 (GAUZE/BANDAGES/DRESSINGS) IMPLANT
ELECT REM PT RETURN 15FT ADLT (MISCELLANEOUS) ×1 IMPLANT
FEMORAL BUSHING OSS ARCOM SET (Knees) IMPLANT
GAUZE SPONGE 2X2 8PLY STRL LF (GAUZE/BANDAGES/DRESSINGS) IMPLANT
GAUZE SPONGE 4X4 12PLY STRL (GAUZE/BANDAGES/DRESSINGS) ×1 IMPLANT
GLOVE BIO SURGEON STRL SZ 6.5 (GLOVE) ×2 IMPLANT
GLOVE BIOGEL PI IND STRL 6.5 (GLOVE) ×1 IMPLANT
GLOVE BIOGEL PI IND STRL 8 (GLOVE) ×1 IMPLANT
GLOVE SURG ORTHO 8.0 STRL STRW (GLOVE) ×2 IMPLANT
GOWN STRL REUS W/ TWL XL LVL3 (GOWN DISPOSABLE) ×2 IMPLANT
HOLDER FOLEY CATH W/STRAP (MISCELLANEOUS) ×1 IMPLANT
HOOD PEEL AWAY T7 (MISCELLANEOUS) ×3 IMPLANT
IMPL TAPESTRY BIOINTEGR 40X30 (Mesh General) IMPLANT
KIT TURNOVER KIT A (KITS) ×1 IMPLANT
MANIFOLD NEPTUNE II (INSTRUMENTS) ×1 IMPLANT
MARKER SKIN DUAL TIP RULER LAB (MISCELLANEOUS) ×1 IMPLANT
NS IRRIG 1000ML POUR BTL (IV SOLUTION) ×1 IMPLANT
PACK TOTAL KNEE CUSTOM (KITS) ×1 IMPLANT
PENCIL SMOKE EVACUATOR (MISCELLANEOUS) ×1 IMPLANT
PIN DRILL HDLS TROCAR 75 4PK (PIN) IMPLANT
PIN LOCKING OSS ARCOM (Knees) IMPLANT
SET HNDPC FAN SPRY TIP SCT (DISPOSABLE) ×1 IMPLANT
SOLUTION IRRIG SURGIPHOR (IV SOLUTION) IMPLANT
SOLUTION PRONTOSAN WOUND 350ML (IRRIGATION / IRRIGATOR) IMPLANT
STEM BOWED IM W/SCREW 11X150 (Stem) IMPLANT
STEM POLY PAT PLY 32M KNEE (Knees) IMPLANT
STRIP CLOSURE SKIN 1/2X4 (GAUZE/BANDAGES/DRESSINGS) ×1 IMPLANT
SUT MNCRL AB 3-0 PS2 18 (SUTURE) ×1 IMPLANT
SUT STRATAFIX 14 PDO 48 VLT (SUTURE) ×1 IMPLANT
SUT VIC AB 1 CTX 18 (SUTURE) IMPLANT
SUT VIC AB 2-0 CT2 27 (SUTURE) ×2 IMPLANT
SUTURE STRATFX 0 PDS 27 VIOLET (SUTURE) ×1 IMPLANT
SYR 50ML LL SCALE MARK (SYRINGE) ×1 IMPLANT
SYSTEM YOKE ORTHOPEDIC SALVAGE (Knees) IMPLANT
TRAY FOLEY MTR SLVR 14FR STAT (SET/KITS/TRAYS/PACK) IMPLANT
TUBE SUCTION HIGH CAP CLEAR NV (SUCTIONS) ×1 IMPLANT
UNDERPAD 30X36 HEAVY ABSORB (UNDERPADS AND DIAPERS) ×1 IMPLANT
WRAP KNEE MAXI GEL POST OP (GAUZE/BANDAGES/DRESSINGS) ×1 IMPLANT

## 2024-04-16 NOTE — Progress Notes (Signed)
 Orthopedic Tech Progress Note Patient Details:  Joan Townsend July 10, 1941 [id number]  Ortho Devices Type of Ortho Device: Bone foam zero knee Ortho Device/Splint Location: Bulky Jones foot to thigh reapply KI Ortho Device/Splint Interventions: Ordered   Post Interventions Patient Tolerated: Well Instructions Provided: Care of device Bonefoam left at bedside and not yet applied due to pain. [name] 04/16/2024, 6:14 PM

## 2024-04-16 NOTE — Discharge Instructions (Signed)
Information on my medicine - ELIQUIS® (apixaban) ° °This medication education was reviewed with me or my healthcare representative as part of my discharge preparation.  ° °Why was Eliquis® prescribed for you? °Eliquis® was prescribed for you to reduce the risk of blood clots forming after orthopedic surgery.   ° °What do You need to know about Eliquis®? °Take your Eliquis® TWICE DAILY - one tablet in the morning and one tablet in the evening with or without food.  It would be best to take the dose about the same time each day. ° °If you have difficulty swallowing the tablet whole please discuss with your pharmacist how to take the medication safely. ° °Take Eliquis® exactly as prescribed by your doctor and DO NOT stop taking Eliquis® without talking to the doctor who prescribed the medication.  Stopping without other medication to take the place of Eliquis® may increase your risk of developing a clot. ° °After discharge, you should have regular check-up appointments with your healthcare provider that is prescribing your Eliquis®. ° °What do you do if you miss a dose? °If a dose of ELIQUIS® is not taken at the scheduled time, take it as soon as possible on the same day and twice-daily administration should be resumed.  The dose should not be doubled to make up for a missed dose.  Do not take more than one tablet of ELIQUIS at the same time. ° °Important Safety Information °A possible side effect of Eliquis® is bleeding. You should call your healthcare provider right away if you experience any of the following: °? Bleeding from an injury or your nose that does not stop. °? Unusual colored urine (red or dark brown) or unusual colored stools (red or black). °? Unusual bruising for unknown reasons. °? A serious fall or if you hit your head (even if there is no bleeding). ° °Some medicines may interact with Eliquis® and might increase your risk of bleeding or clotting while on Eliquis®. To help avoid this, consult your  healthcare provider or pharmacist prior to using any new prescription or non-prescription medications, including herbals, vitamins, non-steroidal anti-inflammatory drugs (NSAIDs) and supplements. ° °This website has more information on Eliquis® (apixaban): http://www.eliquis.com/eliquis/home ° °

## 2024-04-16 NOTE — Plan of Care (Signed)
   Problem: Education: Goal: Knowledge of General Education information will improve Description: Including pain rating scale, medication(s)/side effects and non-pharmacologic comfort measures Outcome: Progressing   Problem: Pain Managment: Goal: General experience of comfort will improve and/or be controlled Outcome: Progressing   Problem: Safety: Goal: Ability to remain free from injury will improve Outcome: Progressing

## 2024-04-16 NOTE — TOC Progression Note (Signed)
 Transition of Care [facility]) - Progression Note    Patient Details  Name: Joan Townsend MRN: [id number] Date of Birth: 08-Jan-1941  Transition of Care [facility]) CM/SW Contact  NORMAN ASPEN, LCSW Phone Number: 04/16/2024, 11:23 AM  Clinical Narrative:     Met with pt and daughter just to touch base to review CSW role with dc planning as patient transferred here from [facility] for surgery today.  We reviewed possible dc needs of HH vs. SNF dependent on PT evaluation and recommendations.  Pt anticipating likely need for short term SNF and already has some preferences on facilities.  Will plan to follow up with pt tomorrow once PT eval completed and discuss further.    Barriers to Discharge: Continued Medical Work up  Expected Discharge Plan and Services       Living arrangements for the past 2 months: Single Family Home                                       Social Determinants of Health (SDOH) Interventions SDOH Screenings   Food Insecurity: No Food Insecurity (04/12/2024)  Housing: Low Risk  (04/12/2024)  Transportation Needs: No Transportation Needs (04/12/2024)  Utilities: Not At Risk (04/12/2024)  Alcohol Screen: Low Risk  (02/22/2024)  Depression (PHQ2-9): Low Risk  (02/22/2024)  Financial Resource Strain: Low Risk  (02/22/2024)  Physical Activity: Unknown (02/22/2024)  Social Connections: Moderately Integrated (04/12/2024)  Stress: Patient Declined (02/22/2024)  Tobacco Use: Medium Risk (04/12/2024)    Readmission Risk Interventions     No data to display

## 2024-04-16 NOTE — Anesthesia Preprocedure Evaluation (Addendum)
 Anesthesia Evaluation  Patient identified by MRN, date of birth, ID band Patient awake    Reviewed: Allergy  & Precautions, NPO status , Patient's Chart, lab work & pertinent test results  History of Anesthesia Complications Negative for: history of anesthetic complications  Airway Mallampati: III  TM Distance: >3 FB Neck ROM: Full    Dental  (+) Dental Advisory Given   Pulmonary neg shortness of breath, neg sleep apnea, neg COPD, neg recent URI, former smoker   Pulmonary exam normal breath sounds clear to auscultation       Cardiovascular hypertension, (-) angina (-) Past MI, (-) Cardiac Stents and (-) CABG (-) dysrhythmias  Rhythm:Regular Rate:Normal  HLD   Neuro/Psych neg Seizures negative neurological ROS     GI/Hepatic ,GERD  Medicated,,(+) 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)  Endo/Other  neg diabetesHypothyroidism    Renal/GU CRFRenal disease (s/p right nephrectomy)     Musculoskeletal  (+) Arthritis ,    Abdominal   Peds  Hematology  (+) Blood dyscrasia, anemia Lab Results      Component                Value               Date                      WBC                      9.0                 04/16/2024                HGB                      8.2 (L)             04/16/2024                HCT                      26.4 (L)            04/16/2024                MCV                      93.6                04/16/2024                PLT                      264                 04/16/2024              Anesthesia Other Findings   Reproductive/Obstetrics Endometriosis                              Anesthesia Physical Anesthesia Plan  ASA: 3  Anesthesia Plan: General   Post-op Pain Management: Ofirmev  IV (intra-op)*, Regional block* and Dilaudid  IV   Induction: Intravenous  PONV Risk Score and Plan: 3 and Ondansetron , Dexamethasone and Treatment may vary due to age or medical  condition  Airway Management Planned: Oral ETT  Additional Equipment:   Intra-op Plan:  Post-operative Plan: Extubation in OR  Informed Consent: I have reviewed the patients History and Physical, chart, labs and discussed the procedure including the risks, benefits and alternatives for the proposed anesthesia with the patient or authorized representative who has indicated his/her understanding and acceptance.     Dental advisory given  Plan Discussed with: CRNA and Anesthesiologist  Anesthesia Plan Comments: (Patient has not received the unit of pRBCs ordered by the surgeon this morning. Will give intra-op.  Discussed potential risks of nerve blocks including, but not limited to, infection, bleeding, nerve damage, seizures, pneumothorax, respiratory depression, and potential failure of the block. Alternatives to nerve blocks discussed. All questions answered.  Risks of general anesthesia discussed including, but not limited to, sore throat, hoarse voice, chipped/damaged teeth, injury to vocal cords, nausea and vomiting, allergic reactions, lung infection, heart attack, stroke, and death. All questions answered. )        Anesthesia Quick Evaluation

## 2024-04-16 NOTE — Anesthesia Postprocedure Evaluation (Signed)
 Anesthesia Post Note  Patient: Joan Townsend  Procedure(s) Performed: ARTHROPLASTY, KNEE, TOTAL (Right: Knee)     Patient location during evaluation: PACU Anesthesia Type: General Level of consciousness: oriented, patient cooperative and sedated Pain control: pain improving. Vital Signs Assessment: post-procedure vital signs reviewed and stable Respiratory status: spontaneous breathing, nonlabored ventilation, respiratory function stable and patient connected to nasal cannula oxygen Cardiovascular status: blood pressure returned to baseline and stable Postop Assessment: no apparent nausea or vomiting Anesthetic complications: no  No notable events documented.  Last Vitals:  Vitals:   04/16/24 1800 04/16/24 1815  BP: (!) 105/58 (!) 126/50  Pulse: (!) 106 95  Resp: 15 16  Temp:    SpO2: 91% 96%    Last Pain:  Vitals:   04/16/24 1800  TempSrc:   PainSc: 6                  Noam Karaffa,E. Matsuko Kretz
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
## 2024-04-16 NOTE — Plan of Care (Signed)
  Problem: Pain Managment: Goal: General experience of comfort will improve and/or be controlled Outcome: Progressing   Problem: Nutrition: Goal: Adequate nutrition will be maintained Outcome: Progressing   Problem: Safety: Goal: Ability to remain free from injury will improve Outcome: Progressing
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
## 2024-04-16 NOTE — Transfer of Care (Signed)
 Immediate Anesthesia Transfer of Care Note  Patient: Joan Townsend  Procedure(s) Performed: ARTHROPLASTY, KNEE, TOTAL (Right: Knee)  Patient Location: PACU  Anesthesia Type:GA combined with regional for post-op pain  Level of Consciousness: oriented and drowsy  Airway & Oxygen Therapy: Patient Spontanous Breathing and Patient connected to nasal cannula oxygen  Post-op Assessment: Report given to RN and Post -op Vital signs reviewed and stable  Post vital signs: Reviewed and stable  Last Vitals:  Vitals Value Taken Time  BP 145/46 04/16/24 17:30  Temp    Pulse 99 04/16/24 17:35  Resp 11 04/16/24 17:35  SpO2 95 % 04/16/24 17:35  Vitals shown include unfiled device data.  Last Pain:  Vitals:   04/16/24 1350  TempSrc:   PainSc: 0-No pain      Patients Stated Pain Goal: 2 (04/16/24 0333)  Complications: No notable events documented.

## 2024-04-16 NOTE — Progress Notes (Signed)
 PHARMACY NOTE:  ANTIMICROBIAL RENAL DOSAGE ADJUSTMENT  Current antimicrobial regimen includes a mismatch between antimicrobial dosage and estimated renal function.  As per policy approved by the Pharmacy & Therapeutics and Medical Executive Committees, the antimicrobial dosage will be adjusted accordingly.  Current antimicrobial dosage:  Vancomycin 1 gm IV q12h x 3 days  Indication: surgical prophylaxis  Renal Function:  Estimated Creatinine Clearance: 34.9 mL/min (A) (by C-G formula based on SCr of 1.06 mg/dL (H)).  Antimicrobial dosage has been changed to:  vancomycin 750 mg IV q24 x 3 days   Thank you for allowing pharmacy to be a part of this patient's care.  Rosaline IVAR Edison, Pharm.D Use secure chat for questions 04/16/2024 7:02 PM

## 2024-04-16 NOTE — Anesthesia Procedure Notes (Signed)
 Procedure Name: Intubation Date/Time: 04/16/2024 2:11 PM  Performed by: Landy Chip HERO, CRNAPre-anesthesia Checklist: Patient identified, Emergency Drugs available, Suction available and Patient being monitored Patient Re-evaluated:Patient Re-evaluated prior to induction Oxygen Delivery Method: Circle System Utilized Preoxygenation: Pre-oxygenation with 100% oxygen Induction Type: IV induction Ventilation: Mask ventilation without difficulty Laryngoscope Size: Mac and 4 Grade View: Grade II Tube type: Oral Tube size: 7.0 mm Number of attempts: 1 Airway Equipment and Method: Stylet Placement Confirmation: ETT inserted through vocal cords under direct vision, positive ETCO2 and breath sounds checked- equal and bilateral Secured at: 21 cm Tube secured with: Tape Dental Injury: Teeth and Oropharynx as per pre-operative assessment

## 2024-04-16 NOTE — Anesthesia Procedure Notes (Signed)
 Anesthesia Regional Block: Adductor canal block   Pre-Anesthetic Checklist: , timeout performed,  Correct Patient, Correct Site, Correct Laterality,  Correct Procedure, Correct Position, site marked,  Risks and benefits discussed,  Surgical consent,  Pre-op evaluation,  At surgeon's request and post-op pain management  Laterality: Right  Prep: chloraprep       Needles:  Injection technique: Single-shot  Needle Type: Echogenic Stimulator Needle     Needle Length: 9cm  Needle Gauge: 21     Additional Needles:   Procedures:,,,, ultrasound used (permanent image in chart),,    Narrative:  Start time: 04/16/2024 1:35 PM End time: 04/16/2024 1:39 PM Injection made incrementally with aspirations every 5 mL.  Performed by: Personally  Anesthesiologist: Peggye Delon Brunswick, MD  Additional Notes: Discussed risks and benefits of nerve block including, but not limited to, prolonged and/or permanent nerve injury involving sensory and/or motor function. Monitors were applied and a time-out was performed. The nerve and associated structures were visualized under ultrasound guidance. After negative aspiration, local anesthetic was slowly injected around the nerve. There was no evidence of high pressure during the procedure. There were no paresthesias. VSS remained stable and the patient tolerated the procedure well.

## 2024-04-17 LAB — TYPE AND SCREEN
ABO/RH(D): O POS
Antibody Screen: NEGATIVE
Unit division: 0

## 2024-04-17 LAB — BPAM RBC
Blood Product Expiration Date: 202507202359
ISSUE DATE / TIME: 202506231438
Unit Type and Rh: 5100
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
## 2024-04-17 NOTE — Plan of Care (Signed)
  Problem: Pain Management: Goal: Pain level will decrease with appropriate interventions Outcome: Progressing   Problem: Activity: Goal: Range of joint motion will improve Outcome: Progressing   Problem: Education: Goal: Knowledge of the prescribed therapeutic regimen will improve Outcome: Progressing   Problem: Clinical Measurements: Goal: Ability to maintain clinical measurements within normal limits will improve Outcome: Progressing   Problem: Safety: Goal: Ability to remain free from injury will improve Outcome: Progressing

## 2024-04-17 NOTE — TOC Progression Note (Signed)
 Transition of Care [facility]) - Progression Note    Patient Details  Name: Joan Townsend MRN: [id number] Date of Birth: 06-24-1941  [facility] CM/SW Contact  NORMAN ASPEN, LCSW Phone Number: 04/17/2024, 2:59 PM  Clinical Narrative:     Met with pt to review PT recommendations for SNF.  She is aware/ agreeable.  Have begun SNF bed search as well as insurance authorization.      Barriers to Discharge: Continued Medical Work up  Expected Discharge Plan and Services       Living arrangements for the past 2 months: Single Family Home                                       Social Determinants of Health (SDOH) Interventions SDOH Screenings   Food Insecurity: No Food Insecurity (04/12/2024)  Housing: Low Risk  (04/12/2024)  Transportation Needs: No Transportation Needs (04/12/2024)  Utilities: Not At Risk (04/12/2024)  Alcohol Screen: Low Risk  (02/22/2024)  Depression (PHQ2-9): Low Risk  (02/22/2024)  Financial Resource Strain: Low Risk  (02/22/2024)  Physical Activity: Unknown (02/22/2024)  Social Connections: Moderately Integrated (04/12/2024)  Stress: Patient Declined (02/22/2024)  Tobacco Use: Medium Risk (04/16/2024)    Readmission Risk Interventions     No data to display
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
## 2024-04-18 LAB — BASIC METABOLIC PANEL WITH GFR
Anion gap: 8 (ref 5–15)
BUN: 26 mg/dL — ABNORMAL HIGH (ref 8–23)
CO2: 27 mmol/L (ref 22–32)
Calcium: 8.5 mg/dL — ABNORMAL LOW (ref 8.9–10.3)
Chloride: 104 mmol/L (ref 98–111)
Creatinine, Ser: 1.27 mg/dL — ABNORMAL HIGH (ref 0.44–1.00)
GFR, Estimated: 41 mL/min — ABNORMAL LOW (ref >60)
Glucose, Bld: 137 mg/dL — ABNORMAL HIGH (ref 70–99)
Potassium: 4.7 mmol/L (ref 3.5–5.1)
Sodium: 139 mmol/L (ref 135–145)

## 2024-04-18 LAB — CBC
HCT: 25.2 % — ABNORMAL LOW (ref 36.0–46.0)
Hemoglobin: 8.3 g/dL — ABNORMAL LOW (ref 12.0–15.0)
MCH: 29.9 pg (ref 26.0–34.0)
MCHC: 32.9 g/dL (ref 30.0–36.0)
MCV: 90.6 fL (ref 80.0–100.0)
Platelets: 291 10*3/uL (ref 150–400)
RBC: 2.78 MIL/uL — ABNORMAL LOW (ref 3.87–5.11)
RDW: 14.6 % (ref 11.5–15.5)
WBC: 11 10*3/uL — ABNORMAL HIGH (ref 4.0–10.5)
nRBC: 0.2 % (ref 0.0–0.2)

## 2024-04-18 MED ORDER — MELOXICAM 15 MG PO TABS: 15.0000 mg | ORAL_TABLET | Freq: Every day | ORAL | 0 refills | Status: AC

## 2024-04-18 MED ORDER — APIXABAN 2.5 MG PO TABS: 2.5000 mg | ORAL_TABLET | Freq: Two times a day (BID) | ORAL | 0 refills | Status: DC

## 2024-04-18 MED ORDER — METHOCARBAMOL 500 MG PO TABS: 500.0000 mg | ORAL_TABLET | Freq: Three times a day (TID) | ORAL | 0 refills | Status: AC | PRN

## 2024-04-18 MED ORDER — OXYCODONE HCL 5 MG PO TABS: 5.0000 mg | ORAL_TABLET | ORAL | 0 refills | Status: AC | PRN

## 2024-04-18 MED ORDER — ACETAMINOPHEN 500 MG PO TABS: 1000.0000 mg | ORAL_TABLET | Freq: Three times a day (TID) | ORAL | Status: AC | PRN

## 2024-04-18 MED ORDER — SODIUM CHLORIDE 0.9 % IV SOLN: INTRAVENOUS | Status: AC | PRN

## 2024-04-18 MED ORDER — ONDANSETRON HCL 4 MG PO TABS: 4.0000 mg | ORAL_TABLET | Freq: Three times a day (TID) | ORAL | 0 refills | Status: AC | PRN

## 2024-04-18 MED ORDER — POLYETHYLENE GLYCOL 3350 17 G PO PACK: 17.0000 g | PACK | Freq: Every day | ORAL | Status: DC

## 2024-04-18 MED ORDER — DOXYCYCLINE HYCLATE 100 MG PO TABS: 100.0000 mg | ORAL_TABLET | Freq: Two times a day (BID) | ORAL | 0 refills | Status: AC

## 2024-04-18 NOTE — Plan of Care (Signed)
  Problem: Clinical Measurements: Goal: Ability to maintain clinical measurements within normal limits will improve Outcome: Progressing Goal: Will remain free from infection Outcome: Progressing Goal: Diagnostic test results will improve Outcome: Progressing Goal: Respiratory complications will improve Outcome: Progressing   Problem: Nutrition: Goal: Adequate nutrition will be maintained Outcome: Progressing   Problem: Pain Managment: Goal: General experience of comfort will improve and/or be controlled Outcome: Progressing   Problem: Safety: Goal: Ability to remain free from injury will improve Outcome: Progressing   Problem: Skin Integrity: Goal: Risk for impaired skin integrity will decrease Outcome: Progressing   Problem: Education: Goal: Knowledge of the prescribed therapeutic regimen will improve Outcome: Progressing Goal: Individualized Educational Video(s) Outcome: Progressing   Problem: Activity: Goal: Ability to avoid complications of mobility impairment will improve Outcome: Progressing   Problem: Clinical Measurements: Goal: Postoperative complications will be avoided or minimized Outcome: Progressing   Problem: Pain Management: Goal: Pain level will decrease with appropriate interventions Outcome: Progressing   Problem: Skin Integrity: Goal: Will show signs of wound healing Outcome: Progressing
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
## 2024-04-18 NOTE — TOC Progression Note (Signed)
 Transition of Care [facility]) - Progression Note    Patient Details  Name: Joan Townsend MRN: [id number] Date of Birth: 03-21-1941  [facility]) CM/SW Contact  NORMAN ASPEN, LCSW Phone Number: 04/18/2024, 1:27 PM  Clinical Narrative:     Pt and family have accepted SNF bed at [facility] who can admit pt tomorrow.  Have received insurance auth for SNF [id number]) and for [facility] transport (# [id number]).  Have alerted MD/ PA/ RN.      Barriers to Discharge: Continued Medical Work up  Expected Discharge Plan and Services       Living arrangements for the past 2 months: Single Family Home                                       Social Determinants of Health (SDOH) Interventions SDOH Screenings   Food Insecurity: No Food Insecurity (04/12/2024)  Housing: Low Risk  (04/12/2024)  Transportation Needs: No Transportation Needs (04/12/2024)  Utilities: Not At Risk (04/12/2024)  Alcohol Screen: Low Risk  (02/22/2024)  Depression (PHQ2-9): Low Risk  (02/22/2024)  Financial Resource Strain: Low Risk  (02/22/2024)  Physical Activity: Unknown (02/22/2024)  Social Connections: Moderately Integrated (04/12/2024)  Stress: Patient Declined (02/22/2024)  Tobacco Use: Medium Risk (04/16/2024)    Readmission Risk Interventions     No data to display
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
## 2024-04-18 NOTE — Plan of Care (Signed)
  Problem: Skin Integrity: Goal: Risk for impaired skin integrity will decrease Outcome: Progressing   Problem: Activity: Goal: Ability to avoid complications of mobility impairment will improve Outcome: Progressing   Problem: Clinical Measurements: Goal: Postoperative complications will be avoided or minimized Outcome: Progressing   Problem: Pain Management: Goal: Pain level will decrease with appropriate interventions Outcome: Progressing   Problem: Skin Integrity: Goal: Will show signs of wound healing Outcome: Progressing

## 2024-04-19 ENCOUNTER — Encounter (HOSPITAL_COMMUNITY): Payer: Self-pay | Admitting: Orthopedic Surgery

## 2024-04-19 DIAGNOSIS — M6281 Muscle weakness (generalized): Secondary | ICD-10-CM | POA: Diagnosis not present

## 2024-04-19 DIAGNOSIS — K5901 Slow transit constipation: Secondary | ICD-10-CM | POA: Diagnosis not present

## 2024-04-19 DIAGNOSIS — I959 Hypotension, unspecified: Secondary | ICD-10-CM | POA: Diagnosis not present

## 2024-04-19 DIAGNOSIS — S72302D Unspecified fracture of shaft of left femur, subsequent encounter for closed fracture with routine healing: Secondary | ICD-10-CM | POA: Diagnosis not present

## 2024-04-19 DIAGNOSIS — S72401A Unspecified fracture of lower end of right femur, initial encounter for closed fracture: Secondary | ICD-10-CM | POA: Diagnosis not present

## 2024-04-19 DIAGNOSIS — Z471 Aftercare following joint replacement surgery: Secondary | ICD-10-CM | POA: Diagnosis not present

## 2024-04-19 DIAGNOSIS — I10 Essential (primary) hypertension: Secondary | ICD-10-CM | POA: Diagnosis not present

## 2024-04-19 DIAGNOSIS — Z905 Acquired absence of kidney: Secondary | ICD-10-CM | POA: Diagnosis not present

## 2024-04-19 DIAGNOSIS — Z7401 Bed confinement status: Secondary | ICD-10-CM | POA: Diagnosis not present

## 2024-04-19 DIAGNOSIS — M25571 Pain in right ankle and joints of right foot: Secondary | ICD-10-CM | POA: Diagnosis not present

## 2024-04-19 DIAGNOSIS — Z96651 Presence of right artificial knee joint: Secondary | ICD-10-CM | POA: Diagnosis not present

## 2024-04-19 DIAGNOSIS — S0101XA Laceration without foreign body of scalp, initial encounter: Secondary | ICD-10-CM | POA: Diagnosis not present

## 2024-04-19 DIAGNOSIS — Z23 Encounter for immunization: Secondary | ICD-10-CM | POA: Diagnosis present

## 2024-04-19 DIAGNOSIS — N183 Chronic kidney disease, stage 3 unspecified: Secondary | ICD-10-CM | POA: Diagnosis not present

## 2024-04-19 DIAGNOSIS — R2689 Other abnormalities of gait and mobility: Secondary | ICD-10-CM | POA: Diagnosis not present

## 2024-04-19 DIAGNOSIS — D649 Anemia, unspecified: Secondary | ICD-10-CM | POA: Diagnosis not present

## 2024-04-19 DIAGNOSIS — S0990XA Unspecified injury of head, initial encounter: Secondary | ICD-10-CM | POA: Diagnosis not present

## 2024-04-19 DIAGNOSIS — Z9189 Other specified personal risk factors, not elsewhere classified: Secondary | ICD-10-CM | POA: Diagnosis not present

## 2024-04-19 DIAGNOSIS — S0181XD Laceration without foreign body of other part of head, subsequent encounter: Secondary | ICD-10-CM | POA: Diagnosis not present

## 2024-04-19 DIAGNOSIS — S7291XA Unspecified fracture of right femur, initial encounter for closed fracture: Secondary | ICD-10-CM | POA: Diagnosis not present

## 2024-04-19 DIAGNOSIS — Y92410 Unspecified street and highway as the place of occurrence of the external cause: Secondary | ICD-10-CM | POA: Diagnosis not present

## 2024-04-19 DIAGNOSIS — E039 Hypothyroidism, unspecified: Secondary | ICD-10-CM | POA: Diagnosis not present

## 2024-04-19 NOTE — Discharge Summary (Signed)
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 resumed.  The dose should not be doubled to make up for a missed dose.  Do not take more than one tablet of ELIQUIS at the same time.  Important Safety Information A possible side effect of Eliquis is bleeding. You should call your healthcare provider right away if you experience any of the following: Bleeding from an injury or your nose that does not stop. Unusual colored urine (red or dark brown) or unusual colored stools (red or black). Unusual bruising for unknown reasons. A serious fall or if you hit your head (even if there is no bleeding).  Some medicines may interact with Eliquis and might increase your risk of bleeding or clotting while on Eliquis. To help avoid this, consult your healthcare provider or pharmacist prior to using any new prescription or non-prescription medications, including herbals, vitamins, non-steroidal anti-inflammatory drugs (NSAIDs) and supplements.  This website has more information on Eliquis  (apixaban): http://www.eliquis.com/eliquis/home    Signed: Esther Bradstreet A Mitchael Luckey 04/19/2024, 6:43 AM
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
## 2024-04-19 NOTE — Care Management Important Message (Signed)
 Important Message  Patient Details IM Letter given. Name: Joan Townsend MRN: [id number] Date of Birth: 1941/06/09   Important Message Given:  Yes - Medicare IM     [name] 04/19/2024, 10:34 AM

## 2024-04-19 NOTE — TOC Transition Note (Signed)
 Transition of Care [facility]) - Discharge Note   Patient Details  Name: Joan Townsend MRN: [id number] Date of Birth: 24-Apr-1941  [facility] CM/SW Contact:  NORMAN ASPEN, LCSW Phone Number: 04/19/2024, 2:46 PM   Clinical Narrative:     Pt medically cleared for dc today to [facility].  PTAR called at 10:25am.  RN called report to facility.  No further TOC needs.  Final next level of care: Skilled Nursing Facility Barriers to Discharge: Barriers Resolved   Patient Goals and CMS Choice Patient states their goals for this hospitalization and ongoing recovery are:: return home following STR CMS Medicare.gov Compare Post Acute Care list provided to:: Patient Choice offered to / list presented to : Patient, Adult Children      Discharge Placement PASRR number recieved: 04/18/24            Patient chooses bed at: [facility] Patient to be transferred to facility by: PTAR Name of family member notified: pt and daughter Patient and family notified of of transfer: 04/19/24  Discharge Plan and Services Additional resources added to the After Visit Summary for                  DME Arranged: N/A DME Agency: NA                  Social Drivers of Health (SDOH) Interventions SDOH Screenings   Food Insecurity: No Food Insecurity (04/12/2024)  Housing: Low Risk  (04/12/2024)  Transportation Needs: No Transportation Needs (04/12/2024)  Utilities: Not At Risk (04/12/2024)  Alcohol Screen: Low Risk  (02/22/2024)  Depression (PHQ2-9): Low Risk  (02/22/2024)  Financial Resource Strain: Low Risk  (02/22/2024)  Physical Activity: Unknown (02/22/2024)  Social Connections: Moderately Integrated (04/12/2024)  Stress: Patient Declined (02/22/2024)  Tobacco Use: Medium Risk (04/16/2024)     Readmission Risk Interventions     No data to display

## 2024-04-19 NOTE — Plan of Care (Signed)
  Problem: Clinical Measurements: Goal: Ability to maintain clinical measurements within normal limits will improve Outcome: Adequate for Discharge Goal: Will remain free from infection Outcome: Adequate for Discharge Goal: Diagnostic test results will improve Outcome: Adequate for Discharge Goal: Respiratory complications will improve Outcome: Adequate for Discharge   Problem: Nutrition: Goal: Adequate nutrition will be maintained Outcome: Adequate for Discharge   Problem: Pain Managment: Goal: General experience of comfort will improve and/or be controlled Outcome: Adequate for Discharge   Problem: Safety: Goal: Ability to remain free from injury will improve Outcome: Adequate for Discharge   Problem: Skin Integrity: Goal: Risk for impaired skin integrity will decrease Outcome: Adequate for Discharge   Problem: Education: Goal: Knowledge of the prescribed therapeutic regimen will improve Outcome: Adequate for Discharge Goal: Individualized Educational Video(s) Outcome: Adequate for Discharge   Problem: Activity: Goal: Ability to avoid complications of mobility impairment will improve Outcome: Adequate for Discharge   Problem: Clinical Measurements: Goal: Postoperative complications will be avoided or minimized Outcome: Adequate for Discharge   Problem: Pain Management: Goal: Pain level will decrease with appropriate interventions Outcome: Adequate for Discharge   Problem: Skin Integrity: Goal: Will show signs of wound healing Outcome: Adequate for Discharge

## 2024-05-01 DIAGNOSIS — S72401A Unspecified fracture of lower end of right femur, initial encounter for closed fracture: Secondary | ICD-10-CM | POA: Diagnosis not present

## 2024-05-08 DIAGNOSIS — M25571 Pain in right ankle and joints of right foot: Secondary | ICD-10-CM | POA: Diagnosis not present

## 2024-05-10 DIAGNOSIS — D649 Anemia, unspecified: Secondary | ICD-10-CM | POA: Diagnosis not present

## 2024-05-10 DIAGNOSIS — S0181XD Laceration without foreign body of other part of head, subsequent encounter: Secondary | ICD-10-CM | POA: Diagnosis not present

## 2024-05-10 DIAGNOSIS — S7291XA Unspecified fracture of right femur, initial encounter for closed fracture: Secondary | ICD-10-CM | POA: Diagnosis not present

## 2024-05-11 DIAGNOSIS — Z4889 Encounter for other specified surgical aftercare: Secondary | ICD-10-CM | POA: Diagnosis not present

## 2024-05-11 DIAGNOSIS — N183 Chronic kidney disease, stage 3 unspecified: Secondary | ICD-10-CM | POA: Diagnosis not present

## 2024-05-11 DIAGNOSIS — D631 Anemia in chronic kidney disease: Secondary | ICD-10-CM | POA: Diagnosis not present

## 2024-05-11 DIAGNOSIS — I129 Hypertensive chronic kidney disease with stage 1 through stage 4 chronic kidney disease, or unspecified chronic kidney disease: Secondary | ICD-10-CM | POA: Diagnosis not present

## 2024-05-11 DIAGNOSIS — Z7901 Long term (current) use of anticoagulants: Secondary | ICD-10-CM | POA: Diagnosis not present

## 2024-05-11 DIAGNOSIS — Z9181 History of falling: Secondary | ICD-10-CM | POA: Diagnosis not present

## 2024-05-11 DIAGNOSIS — E039 Hypothyroidism, unspecified: Secondary | ICD-10-CM | POA: Diagnosis not present

## 2024-05-11 DIAGNOSIS — S72402D Unspecified fracture of lower end of left femur, subsequent encounter for closed fracture with routine healing: Secondary | ICD-10-CM | POA: Diagnosis not present

## 2024-05-21 DIAGNOSIS — M19071 Primary osteoarthritis, right ankle and foot: Secondary | ICD-10-CM | POA: Diagnosis not present

## 2024-05-24 ENCOUNTER — Encounter: Payer: Self-pay | Admitting: Internal Medicine

## 2024-05-24 ENCOUNTER — Ambulatory Visit (INDEPENDENT_AMBULATORY_CARE_PROVIDER_SITE_OTHER): Admitting: Internal Medicine

## 2024-05-24 ENCOUNTER — Ambulatory Visit: Payer: Self-pay | Admitting: Internal Medicine

## 2024-05-24 VITALS — BP 112/74 | HR 70 | Temp 98.1°F | Ht 65.0 in | Wt 165.8 lb

## 2024-05-24 DIAGNOSIS — D509 Iron deficiency anemia, unspecified: Secondary | ICD-10-CM

## 2024-05-24 DIAGNOSIS — I10 Essential (primary) hypertension: Secondary | ICD-10-CM

## 2024-05-24 DIAGNOSIS — K5900 Constipation, unspecified: Secondary | ICD-10-CM

## 2024-05-24 DIAGNOSIS — E538 Deficiency of other specified B group vitamins: Secondary | ICD-10-CM | POA: Diagnosis not present

## 2024-05-24 LAB — BASIC METABOLIC PANEL WITH GFR
BUN: 16 mg/dL (ref 6–23)
CO2: 29 meq/L (ref 19–32)
Calcium: 9.6 mg/dL (ref 8.4–10.5)
Chloride: 104 meq/L (ref 96–112)
Creatinine, Ser: 1.31 mg/dL — ABNORMAL HIGH (ref 0.40–1.20)
GFR: 37.15 mL/min — ABNORMAL LOW (ref >60.00)
Glucose, Bld: 107 mg/dL — ABNORMAL HIGH (ref 70–99)
Potassium: 4.4 meq/L (ref 3.5–5.1)
Sodium: 141 meq/L (ref 135–145)

## 2024-05-24 LAB — IBC PANEL
Iron: 39 ug/dL — ABNORMAL LOW (ref 42–145)
Saturation Ratios: 14.5 % — ABNORMAL LOW (ref 20.0–50.0)
TIBC: 268.8 ug/dL (ref 250.0–450.0)
Transferrin: 192 mg/dL — ABNORMAL LOW (ref 212.0–360.0)

## 2024-05-24 LAB — CBC WITH DIFFERENTIAL/PLATELET
Basophils Absolute: 0.1 K/uL (ref 0.0–0.1)
Basophils Relative: 0.7 % (ref 0.0–3.0)
Eosinophils Absolute: 0.1 K/uL (ref 0.0–0.7)
Eosinophils Relative: 1.2 % (ref 0.0–5.0)
HCT: 37.6 % (ref 36.0–46.0)
Hemoglobin: 12 g/dL (ref 12.0–15.0)
Lymphocytes Relative: 18.9 % (ref 12.0–46.0)
Lymphs Abs: 1.6 K/uL (ref 0.7–4.0)
MCHC: 32 g/dL (ref 30.0–36.0)
MCV: 89.4 fl (ref 78.0–100.0)
Monocytes Absolute: 0.6 K/uL (ref 0.1–1.0)
Monocytes Relative: 7.5 % (ref 3.0–12.0)
Neutro Abs: 6 K/uL (ref 1.4–7.7)
Neutrophils Relative %: 71.7 % (ref 43.0–77.0)
Platelets: 353 K/uL (ref 150.0–400.0)
RBC: 4.2 Mil/uL (ref 3.87–5.11)
RDW: 16.3 % — ABNORMAL HIGH (ref 11.5–15.5)
WBC: 8.4 K/uL (ref 4.0–10.5)

## 2024-05-24 LAB — HEPATIC FUNCTION PANEL
ALT: 9 U/L (ref 0–35)
AST: 15 U/L (ref 0–37)
Albumin: 4.1 g/dL (ref 3.5–5.2)
Alkaline Phosphatase: 81 U/L (ref 39–117)
Bilirubin, Direct: 0.1 mg/dL (ref 0.0–0.3)
Total Bilirubin: 0.4 mg/dL (ref 0.2–1.2)
Total Protein: 7.2 g/dL (ref 6.0–8.3)

## 2024-05-24 LAB — FERRITIN: Ferritin: 73.3 ng/mL (ref 10.0–291.0)

## 2024-05-24 MED ORDER — CYANOCOBALAMIN 1000 MCG/ML IJ SOLN
1000.0000 ug | Freq: Once | INTRAMUSCULAR | Status: AC
  Administered 2024-05-24: 1000 ug via INTRAMUSCULAR

## 2024-05-24 NOTE — Patient Instructions (Signed)
 You had the B12 shot today  Please continue all other medications as before, and refills have been done if requested.  Please have the pharmacy call with any other refills you may need.  Please keep your appointments with your specialists as you may have planned  Please go to the LAB at the blood drawing area for the tests to be done  You will be contacted by phone if any changes need to be made immediately.  Otherwise, you will receive a letter about your results with an explanation, but please check with MyChart first.  Please make an Appointment to return in 6 months, or sooner if needed

## 2024-05-24 NOTE — Progress Notes (Signed)
 The test results show that your current treatment is OK, as the tests are stable.  Please continue the same plan.  There is no other need for change of treatment or further evaluation based on these results, at this time.  thanks
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
## 2024-05-26 ENCOUNTER — Encounter: Payer: Self-pay | Admitting: Internal Medicine

## 2024-05-26 DIAGNOSIS — E538 Deficiency of other specified B group vitamins: Secondary | ICD-10-CM | POA: Insufficient documentation

## 2024-05-26 DIAGNOSIS — K5900 Constipation, unspecified: Secondary | ICD-10-CM | POA: Insufficient documentation

## 2024-05-26 NOTE — Assessment & Plan Note (Signed)
 BP Readings from Last 3 Encounters:  05/24/24 112/74  04/19/24 (!) 159/71  02/22/24 138/84   Stable, pt to continue medical treatment  - diet, wt control

## 2024-05-26 NOTE — Assessment & Plan Note (Signed)
 Also for B12 1000 mg IM today

## 2024-05-26 NOTE — Assessment & Plan Note (Signed)
 No recent overt bleeding, now for cbc, iron

## 2024-05-26 NOTE — Assessment & Plan Note (Signed)
 Also for miralax  17 gm every day prn

## 2024-05-29 DIAGNOSIS — M25561 Pain in right knee: Secondary | ICD-10-CM | POA: Diagnosis not present

## 2024-05-29 NOTE — Telephone Encounter (Signed)
 Copied from CRM [id number]. Topic: Clinical - Lab/Test Results >> May 29, 2024 12:49 PM [name] wrote: Reason for CRM: Patient is calling stating she has questions about her recent labs done.   Joan Townsend [phone]

## 2024-05-30 DIAGNOSIS — H52203 Unspecified astigmatism, bilateral: Secondary | ICD-10-CM | POA: Diagnosis not present

## 2024-05-30 DIAGNOSIS — H43813 Vitreous degeneration, bilateral: Secondary | ICD-10-CM | POA: Diagnosis not present

## 2024-05-31 ENCOUNTER — Ambulatory Visit: Payer: Self-pay

## 2024-05-31 NOTE — Telephone Encounter (Signed)
 Sorry, we dont normally order MRI or labs on request without an appropriate exam; if pain is severe, please go to ED now.  We can address at a future office visit at her convenience as well
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
## 2024-06-01 ENCOUNTER — Ambulatory Visit: Payer: Self-pay

## 2024-06-01 ENCOUNTER — Telehealth: Payer: Self-pay | Admitting: Internal Medicine

## 2024-06-01 NOTE — Telephone Encounter (Signed)
 Copied from CRM [id number]. Topic: Appointments - Scheduling Inquiry for Clinic >> Jun 01, 2024  1:23 PM [name] wrote: Reason for CRM: Patient looking to establish care with Dr. Randeen, states her whole family sees her and would like to see her as well, it would be a TOC since patient sees Dr. Norleen at [facility].  [place] [phone]

## 2024-06-01 NOTE — Telephone Encounter (Signed)
 This is new problem  I can see pt for this but no guarantees about the need for MRI or other imaging.  If she is having new neurological symptoms, she should go to ED now to have this done urgently.

## 2024-06-01 NOTE — Telephone Encounter (Signed)
 Called to inform [facility] that this RN relayed the information to the patient.

## 2024-06-01 NOTE — Telephone Encounter (Signed)
 Not currently taking new patients over 60

## 2024-06-01 NOTE — Telephone Encounter (Signed)
 Routing to pt's PCP now and Dr. Randeen for review

## 2024-06-01 NOTE — Telephone Encounter (Signed)
 Called and left voice message for patient to call back.

## 2024-06-01 NOTE — Telephone Encounter (Signed)
 FYI Only or Action Required?: Action required by provider: update on patient condition.  Patient was last seen in primary care on 05/24/2024 by Norleen Lynwood ORN, MD.  Called Nurse Triage reporting Information Only.   Triage Disposition: Call PCP When Office is Open  Patient/caregiver understands and will follow disposition?: Unsure----Relayed message to patient and patient said quickly Okay thank you. Goodbye and hung up on this RN.                       Copied from CRM [id number]. Topic: Clinical - Red Word Triage >> Jun 01, 2024 12:53 PM Joan Townsend wrote: Patient returning a call from [place] in the office in regards to going to the emergency department for further help. I called [facility] to transfer the patient but was instructed to call nurse triage instead to relay the message,.  Reason for Disposition  [1] Caller requesting NON-URGENT health information AND [2] PCP's office is the best resource  Answer Assessment - Initial Assessment Questions Call transferred to this RN.  Patient was returned a phone call from the office that she just missed. Specialist transferring this RN the call states that they called [facility] and [facility] advised that it would be okay for E2C2 Triage to relay the message to the patient. Patient states that she missed a phone call from Dr Lynwood Norleen and wanted to speak with him. This RN informed her that from her chart notes it states that the office staff had called her in regards to relaying the message from Dr Lynwood Norleen. This RN relayed information to the patient verbatim: Norleen Lynwood ORN, MD to Joan Townsend ORN, [facility]     05/31/24  5:03 PM Note Sorry, we dont normally order MRI or labs on request without an appropriate exam; if pain is severe, please go to ED now.  We can address at a future office visit at her convenience as well  &  Norleen Lynwood ORN, MD to Joan Townsend ORN, [facility]     06/01/24 12:29 PM Note This is new problem  I can see pt  for this but no guarantees about the need for MRI or other imaging.  If she is having new neurological symptoms, she should go to ED now to have this done urgently.  Patient quickly replied Okay thank you. Goodbye and hung up on this RN.  Protocols used: Information Only Call - No Triage-A-AH

## 2024-06-01 NOTE — Telephone Encounter (Signed)
 Ok with me

## 2024-06-04 ENCOUNTER — Encounter: Payer: Self-pay | Admitting: Internal Medicine

## 2024-06-04 ENCOUNTER — Ambulatory Visit (INDEPENDENT_AMBULATORY_CARE_PROVIDER_SITE_OTHER): Admitting: Internal Medicine

## 2024-06-04 VITALS — BP 146/92 | HR 78 | Temp 98.0°F | Ht 65.0 in | Wt 165.0 lb

## 2024-06-04 DIAGNOSIS — R519 Headache, unspecified: Secondary | ICD-10-CM | POA: Insufficient documentation

## 2024-06-04 DIAGNOSIS — I10 Essential (primary) hypertension: Secondary | ICD-10-CM

## 2024-06-04 DIAGNOSIS — N1831 Chronic kidney disease, stage 3a: Secondary | ICD-10-CM | POA: Diagnosis not present

## 2024-06-04 DIAGNOSIS — D509 Iron deficiency anemia, unspecified: Secondary | ICD-10-CM

## 2024-06-04 DIAGNOSIS — E538 Deficiency of other specified B group vitamins: Secondary | ICD-10-CM

## 2024-06-04 NOTE — Assessment & Plan Note (Signed)
 Lab Results  Component Value Date   VITAMINB12 720 02/22/2024   Stable, cont oral replacement - b12 1000 mcg qd

## 2024-06-04 NOTE — Patient Instructions (Signed)
 Please continue all other medications as before, and refills have been done if requested.  Please have the pharmacy call with any other refills you may need.  Please continue your efforts at being more active, low cholesterol diet, and weight control.  Please keep your appointments with your specialists as you may have planned  You will be contacted regarding the referral for: MRI  We can hold on further lab testing today

## 2024-06-04 NOTE — Assessment & Plan Note (Signed)
 Lab Results  Component Value Date   CREATININE 1.31 (H) 05/24/2024   Stable overall, cont to avoid nephrotoxins
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
## 2024-06-04 NOTE — Assessment & Plan Note (Signed)
 With increasing frequency and severity, now for Brain MRI

## 2024-06-04 NOTE — Assessment & Plan Note (Addendum)
 Resolved with recent normal ferritin,  to f/u any worsening symptoms or concerns

## 2024-06-04 NOTE — Assessment & Plan Note (Signed)
 BP Readings from Last 3 Encounters:  06/04/24 (!) 146/92  05/24/24 112/74  04/19/24 (!) 159/71   Uncontrolled with anxiety and pain today, pt to continue medical treatment  - diet, wt control, declines other change today

## 2024-06-06 ENCOUNTER — Ambulatory Visit
Admission: RE | Admit: 2024-06-06 | Discharge: 2024-06-06 | Disposition: A | Discharge status: Home / Self Care | Source: Ambulatory Visit | Attending: Internal Medicine | Admitting: Internal Medicine

## 2024-06-06 DIAGNOSIS — G319 Degenerative disease of nervous system, unspecified: Secondary | ICD-10-CM | POA: Diagnosis not present

## 2024-06-06 DIAGNOSIS — Z471 Aftercare following joint replacement surgery: Secondary | ICD-10-CM | POA: Diagnosis not present

## 2024-06-06 DIAGNOSIS — R519 Headache, unspecified: Secondary | ICD-10-CM | POA: Diagnosis not present

## 2024-06-06 DIAGNOSIS — R9082 White matter disease, unspecified: Secondary | ICD-10-CM | POA: Diagnosis not present

## 2024-06-07 ENCOUNTER — Ambulatory Visit: Payer: Self-pay | Admitting: Internal Medicine

## 2024-06-14 ENCOUNTER — Telehealth: Payer: Self-pay | Admitting: Family

## 2024-06-14 NOTE — Telephone Encounter (Signed)
 Please r/s NP  Looks like one that was a part of that session schedule change  thanks

## 2024-06-18 ENCOUNTER — Other Ambulatory Visit: Payer: Self-pay | Admitting: Orthopedic Surgery

## 2024-06-18 DIAGNOSIS — M4802 Spinal stenosis, cervical region: Secondary | ICD-10-CM

## 2024-06-22 DIAGNOSIS — M19071 Primary osteoarthritis, right ankle and foot: Secondary | ICD-10-CM | POA: Diagnosis not present

## 2024-06-29 ENCOUNTER — Other Ambulatory Visit: Payer: Self-pay | Admitting: Orthopedic Surgery

## 2024-06-29 DIAGNOSIS — M48061 Spinal stenosis, lumbar region without neurogenic claudication: Secondary | ICD-10-CM

## 2024-06-29 DIAGNOSIS — M4802 Spinal stenosis, cervical region: Secondary | ICD-10-CM

## 2024-06-30 ENCOUNTER — Other Ambulatory Visit

## 2024-07-03 ENCOUNTER — Ambulatory Visit: Admitting: Family

## 2024-07-12 ENCOUNTER — Ambulatory Visit
Admission: RE | Admit: 2024-07-12 | Discharge: 2024-07-12 | Disposition: A | Discharge status: Home / Self Care | Source: Ambulatory Visit | Attending: Orthopedic Surgery | Admitting: Orthopedic Surgery

## 2024-07-12 DIAGNOSIS — M4802 Spinal stenosis, cervical region: Secondary | ICD-10-CM

## 2024-07-12 DIAGNOSIS — M47816 Spondylosis without myelopathy or radiculopathy, lumbar region: Secondary | ICD-10-CM | POA: Diagnosis not present

## 2024-07-12 DIAGNOSIS — M47812 Spondylosis without myelopathy or radiculopathy, cervical region: Secondary | ICD-10-CM | POA: Diagnosis not present

## 2024-07-12 DIAGNOSIS — M5136 Other intervertebral disc degeneration, lumbar region with discogenic back pain only: Secondary | ICD-10-CM | POA: Diagnosis not present

## 2024-07-12 DIAGNOSIS — M5030 Other cervical disc degeneration, unspecified cervical region: Secondary | ICD-10-CM | POA: Diagnosis not present

## 2024-07-12 DIAGNOSIS — M48061 Spinal stenosis, lumbar region without neurogenic claudication: Secondary | ICD-10-CM

## 2024-07-19 DIAGNOSIS — M25561 Pain in right knee: Secondary | ICD-10-CM | POA: Diagnosis not present

## 2024-07-24 DIAGNOSIS — M25561 Pain in right knee: Secondary | ICD-10-CM | POA: Diagnosis not present

## 2024-08-06 ENCOUNTER — Ambulatory Visit: Admitting: General Practice

## 2024-08-08 ENCOUNTER — Ambulatory Visit: Admitting: Neurology

## 2024-08-08 ENCOUNTER — Encounter: Payer: Self-pay | Admitting: Neurology

## 2024-08-08 DIAGNOSIS — M542 Cervicalgia: Secondary | ICD-10-CM | POA: Diagnosis not present
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
## 2024-08-08 NOTE — Patient Instructions (Signed)
 Continue current medications  Continue with chiropractor Will refer you to physical therapy  Return as needed

## 2024-08-09 DIAGNOSIS — R399 Unspecified symptoms and signs involving the genitourinary system: Secondary | ICD-10-CM | POA: Diagnosis not present

## 2024-08-09 DIAGNOSIS — N200 Calculus of kidney: Secondary | ICD-10-CM | POA: Diagnosis not present

## 2024-08-09 DIAGNOSIS — Z905 Acquired absence of kidney: Secondary | ICD-10-CM | POA: Diagnosis not present

## 2024-08-29 ENCOUNTER — Ambulatory Visit: Attending: Neurology

## 2024-08-29 DIAGNOSIS — M542 Cervicalgia: Secondary | ICD-10-CM | POA: Insufficient documentation

## 2024-08-29 DIAGNOSIS — M79604 Pain in right leg: Secondary | ICD-10-CM | POA: Insufficient documentation

## 2024-08-29 DIAGNOSIS — M5459 Other low back pain: Secondary | ICD-10-CM | POA: Insufficient documentation

## 2024-08-29 DIAGNOSIS — G4486 Cervicogenic headache: Secondary | ICD-10-CM | POA: Diagnosis not present
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
## 2024-08-30 DIAGNOSIS — Z01419 Encounter for gynecological examination (general) (routine) without abnormal findings: Secondary | ICD-10-CM | POA: Diagnosis not present

## 2024-08-30 DIAGNOSIS — Z1231 Encounter for screening mammogram for malignant neoplasm of breast: Secondary | ICD-10-CM | POA: Diagnosis not present

## 2024-08-30 DIAGNOSIS — Z1331 Encounter for screening for depression: Secondary | ICD-10-CM | POA: Diagnosis not present

## 2024-08-30 LAB — HM MAMMOGRAPHY

## 2024-09-06 ENCOUNTER — Ambulatory Visit

## 2024-09-06 DIAGNOSIS — Z96651 Presence of right artificial knee joint: Secondary | ICD-10-CM | POA: Diagnosis not present

## 2024-09-07 DIAGNOSIS — M19071 Primary osteoarthritis, right ankle and foot: Secondary | ICD-10-CM | POA: Diagnosis not present

## 2024-09-12 ENCOUNTER — Ambulatory Visit

## 2024-09-12 DIAGNOSIS — M542 Cervicalgia: Secondary | ICD-10-CM | POA: Diagnosis not present

## 2024-09-12 DIAGNOSIS — G4486 Cervicogenic headache: Secondary | ICD-10-CM
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
## 2024-09-26 ENCOUNTER — Ambulatory Visit

## 2024-10-03 ENCOUNTER — Ambulatory Visit

## 2024-10-03 DIAGNOSIS — M542 Cervicalgia: Secondary | ICD-10-CM | POA: Insufficient documentation

## 2024-10-03 DIAGNOSIS — G4486 Cervicogenic headache: Secondary | ICD-10-CM | POA: Insufficient documentation
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
## 2024-10-10 ENCOUNTER — Ambulatory Visit

## 2024-10-17 ENCOUNTER — Ambulatory Visit

## 2024-10-17 DIAGNOSIS — G4486 Cervicogenic headache: Secondary | ICD-10-CM | POA: Diagnosis not present

## 2024-10-17 DIAGNOSIS — M542 Cervicalgia: Secondary | ICD-10-CM
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
## 2024-10-26 ENCOUNTER — Ambulatory Visit: Attending: Neurology

## 2024-10-26 DIAGNOSIS — G4486 Cervicogenic headache: Secondary | ICD-10-CM | POA: Insufficient documentation

## 2024-10-26 DIAGNOSIS — M542 Cervicalgia: Secondary | ICD-10-CM | POA: Insufficient documentation

## 2024-11-02 ENCOUNTER — Ambulatory Visit

## 2024-11-02 DIAGNOSIS — M542 Cervicalgia: Secondary | ICD-10-CM | POA: Diagnosis present

## 2024-11-02 DIAGNOSIS — G4486 Cervicogenic headache: Secondary | ICD-10-CM
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
## 2024-11-09 ENCOUNTER — Ambulatory Visit

## 2024-11-16 ENCOUNTER — Ambulatory Visit

## 2024-11-16 DIAGNOSIS — M542 Cervicalgia: Secondary | ICD-10-CM

## 2024-11-16 DIAGNOSIS — G4486 Cervicogenic headache: Secondary | ICD-10-CM
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
## 2024-11-23 ENCOUNTER — Ambulatory Visit

## 2024-12-07 ENCOUNTER — Ambulatory Visit
